# Patient Record
Sex: Female | Born: 1970
Health system: Southern US, Community
[De-identification: ages and names within clinical notes are randomized; demographics above are authoritative.]

## PROBLEM LIST (undated history)

## (undated) DIAGNOSIS — R55 Syncope and collapse: Secondary | ICD-10-CM

## (undated) DIAGNOSIS — G43909 Migraine, unspecified, not intractable, without status migrainosus: Secondary | ICD-10-CM

## (undated) DIAGNOSIS — E669 Obesity, unspecified: Secondary | ICD-10-CM

## (undated) DIAGNOSIS — F3281 Premenstrual dysphoric disorder: Secondary | ICD-10-CM

## (undated) DIAGNOSIS — F32A Depression, unspecified: Secondary | ICD-10-CM

## (undated) DIAGNOSIS — S86009A Unspecified injury of unspecified Achilles tendon, initial encounter: Secondary | ICD-10-CM

## (undated) DIAGNOSIS — F329 Major depressive disorder, single episode, unspecified: Secondary | ICD-10-CM

## (undated) DIAGNOSIS — N83209 Unspecified ovarian cyst, unspecified side: Secondary | ICD-10-CM

## (undated) DIAGNOSIS — F988 Other specified behavioral and emotional disorders with onset usually occurring in childhood and adolescence: Secondary | ICD-10-CM

## (undated) DIAGNOSIS — K589 Irritable bowel syndrome without diarrhea: Secondary | ICD-10-CM

## (undated) HISTORY — DX: Unspecified ovarian cyst, unspecified side: N83.209

## (undated) HISTORY — DX: Other specified behavioral and emotional disorders with onset usually occurring in childhood and adolescence: F98.8

## (undated) HISTORY — DX: Obesity, unspecified: E66.9

## (undated) HISTORY — DX: Depression, unspecified: F32.A

## (undated) HISTORY — DX: Premenstrual dysphoric disorder: F32.81

## (undated) HISTORY — DX: Unspecified injury of unspecified achilles tendon, initial encounter: S86.009A

## (undated) HISTORY — DX: Major depressive disorder, single episode, unspecified: F32.9

## (undated) HISTORY — DX: Irritable bowel syndrome, unspecified: K58.9

---

## 2005-04-28 ENCOUNTER — Encounter: Payer: Self-pay | Admitting: Family Medicine

## 2007-10-31 ENCOUNTER — Ambulatory Visit: Payer: Self-pay | Admitting: Family Medicine

## 2007-10-31 DIAGNOSIS — F988 Other specified behavioral and emotional disorders with onset usually occurring in childhood and adolescence: Secondary | ICD-10-CM | POA: Insufficient documentation

## 2007-10-31 LAB — CONVERTED CEMR LAB
Nitrite: NEGATIVE
Specific Gravity, Urine: <1.005

## 2007-11-01 ENCOUNTER — Encounter: Payer: Self-pay | Admitting: Family Medicine

## 2007-11-08 ENCOUNTER — Encounter: Payer: Self-pay | Admitting: Family Medicine

## 2007-11-27 ENCOUNTER — Encounter: Payer: Self-pay | Admitting: Family Medicine

## 2007-12-14 ENCOUNTER — Other Ambulatory Visit: Admission: RE | Admit: 2007-12-14 | Discharge: 2007-12-14 | Payer: Self-pay | Admitting: Family Medicine

## 2007-12-14 ENCOUNTER — Ambulatory Visit: Payer: Self-pay | Admitting: Family Medicine

## 2007-12-14 ENCOUNTER — Encounter: Payer: Self-pay | Admitting: Family Medicine

## 2007-12-14 LAB — CONVERTED CEMR LAB
Albumin: 4 g/dL (ref 3.5–5.2)
Alkaline Phosphatase: 108 units/L (ref 39–117)
BUN: 12 mg/dL (ref 6–23)
Calcium: 8.9 mg/dL (ref 8.4–10.5)
Creatinine, Ser: 0.78 mg/dL (ref 0.40–1.20)
Glucose, Bld: 112 mg/dL — ABNORMAL HIGH (ref 70–99)
HDL: 45 mg/dL (ref >39)
LDL Cholesterol: 136 mg/dL — ABNORMAL HIGH (ref 0–99)
Potassium: 4.6 meq/L (ref 3.5–5.3)
Triglycerides: 217 mg/dL — ABNORMAL HIGH (ref <150)

## 2007-12-15 ENCOUNTER — Encounter: Payer: Self-pay | Admitting: Family Medicine

## 2007-12-21 LAB — CONVERTED CEMR LAB: Pap Smear: NORMAL

## 2008-02-14 ENCOUNTER — Telehealth: Payer: Self-pay | Admitting: Family Medicine

## 2008-03-11 ENCOUNTER — Encounter: Admission: RE | Admit: 2008-03-11 | Discharge: 2008-03-11 | Payer: Self-pay | Admitting: Family Medicine

## 2008-03-11 ENCOUNTER — Ambulatory Visit: Payer: Self-pay | Admitting: Family Medicine

## 2008-03-12 ENCOUNTER — Encounter: Payer: Self-pay | Admitting: Family Medicine

## 2008-03-12 ENCOUNTER — Encounter: Admission: RE | Admit: 2008-03-12 | Discharge: 2008-03-27 | Payer: Self-pay | Admitting: Family Medicine

## 2008-03-27 ENCOUNTER — Encounter: Payer: Self-pay | Admitting: Family Medicine

## 2008-04-18 ENCOUNTER — Ambulatory Visit: Payer: Self-pay | Admitting: Family Medicine

## 2008-04-22 ENCOUNTER — Telehealth: Payer: Self-pay | Admitting: Family Medicine

## 2008-06-25 ENCOUNTER — Ambulatory Visit: Payer: Self-pay | Admitting: Family Medicine

## 2008-08-28 ENCOUNTER — Ambulatory Visit: Payer: Self-pay | Admitting: Family Medicine

## 2008-08-28 DIAGNOSIS — F411 Generalized anxiety disorder: Secondary | ICD-10-CM | POA: Insufficient documentation

## 2008-08-28 DIAGNOSIS — M26629 Arthralgia of temporomandibular joint, unspecified side: Secondary | ICD-10-CM | POA: Insufficient documentation

## 2008-09-30 ENCOUNTER — Ambulatory Visit: Payer: Self-pay | Admitting: Family Medicine

## 2008-09-30 DIAGNOSIS — I10 Essential (primary) hypertension: Secondary | ICD-10-CM | POA: Insufficient documentation

## 2008-09-30 DIAGNOSIS — R7301 Impaired fasting glucose: Secondary | ICD-10-CM | POA: Insufficient documentation

## 2008-12-04 ENCOUNTER — Emergency Department (HOSPITAL_BASED_OUTPATIENT_CLINIC_OR_DEPARTMENT_OTHER): Admission: EM | Admit: 2008-12-04 | Discharge: 2008-12-04 | Payer: Self-pay | Admitting: Emergency Medicine

## 2008-12-04 ENCOUNTER — Ambulatory Visit: Payer: Self-pay | Admitting: Diagnostic Radiology

## 2008-12-16 ENCOUNTER — Encounter: Payer: Self-pay | Admitting: Family Medicine

## 2008-12-17 ENCOUNTER — Telehealth (INDEPENDENT_AMBULATORY_CARE_PROVIDER_SITE_OTHER): Payer: Self-pay | Admitting: *Deleted

## 2008-12-17 ENCOUNTER — Ambulatory Visit: Payer: Self-pay | Admitting: Family Medicine

## 2008-12-17 DIAGNOSIS — N83209 Unspecified ovarian cyst, unspecified side: Secondary | ICD-10-CM | POA: Insufficient documentation

## 2008-12-18 ENCOUNTER — Ambulatory Visit: Payer: Self-pay | Admitting: Family Medicine

## 2008-12-18 DIAGNOSIS — R799 Abnormal finding of blood chemistry, unspecified: Secondary | ICD-10-CM | POA: Insufficient documentation

## 2008-12-26 ENCOUNTER — Encounter: Admission: RE | Admit: 2008-12-26 | Discharge: 2008-12-26 | Payer: Self-pay | Admitting: Family Medicine

## 2008-12-26 ENCOUNTER — Encounter: Payer: Self-pay | Admitting: Family Medicine

## 2008-12-26 DIAGNOSIS — K449 Diaphragmatic hernia without obstruction or gangrene: Secondary | ICD-10-CM | POA: Insufficient documentation

## 2009-01-02 ENCOUNTER — Ambulatory Visit: Payer: Self-pay | Admitting: Family Medicine

## 2009-01-02 DIAGNOSIS — S96819A Strain of other specified muscles and tendons at ankle and foot level, unspecified foot, initial encounter: Secondary | ICD-10-CM

## 2009-01-02 DIAGNOSIS — S93499A Sprain of other ligament of unspecified ankle, initial encounter: Secondary | ICD-10-CM | POA: Insufficient documentation

## 2009-01-14 ENCOUNTER — Encounter: Admission: RE | Admit: 2009-01-14 | Discharge: 2009-03-25 | Payer: Self-pay | Admitting: Family Medicine

## 2009-01-17 ENCOUNTER — Encounter: Payer: Self-pay | Admitting: Family Medicine

## 2009-03-17 ENCOUNTER — Ambulatory Visit: Payer: Self-pay | Admitting: Family Medicine

## 2009-03-17 DIAGNOSIS — N920 Excessive and frequent menstruation with regular cycle: Secondary | ICD-10-CM | POA: Insufficient documentation

## 2009-03-18 ENCOUNTER — Encounter: Payer: Self-pay | Admitting: Family Medicine

## 2009-03-18 LAB — CONVERTED CEMR LAB
Albumin: 3.9 g/dL (ref 3.5–5.2)
CO2: 24 meq/L (ref 19–32)
Cholesterol: 218 mg/dL — ABNORMAL HIGH (ref 0–200)
Glucose, Bld: 104 mg/dL — ABNORMAL HIGH (ref 70–99)
Potassium: 4.3 meq/L (ref 3.5–5.3)
RBC: 4.75 M/uL (ref 3.87–5.11)
Sodium: 139 meq/L (ref 135–145)
TIBC: 314 ug/dL (ref 250–470)
Total Protein: 6.6 g/dL (ref 6.0–8.3)
Triglycerides: 178 mg/dL — ABNORMAL HIGH (ref <150)
UIBC: 220 ug/dL
WBC: 6.2 10*3/uL (ref 4.0–10.5)

## 2009-03-19 DIAGNOSIS — R748 Abnormal levels of other serum enzymes: Secondary | ICD-10-CM | POA: Insufficient documentation

## 2009-03-20 ENCOUNTER — Telehealth (INDEPENDENT_AMBULATORY_CARE_PROVIDER_SITE_OTHER): Payer: Self-pay | Admitting: *Deleted

## 2009-03-20 ENCOUNTER — Encounter: Admission: RE | Admit: 2009-03-20 | Discharge: 2009-03-20 | Payer: Self-pay | Admitting: Sports Medicine

## 2009-03-20 ENCOUNTER — Encounter: Payer: Self-pay | Admitting: Family Medicine

## 2009-03-24 ENCOUNTER — Encounter: Payer: Self-pay | Admitting: Family Medicine

## 2009-03-25 ENCOUNTER — Telehealth (INDEPENDENT_AMBULATORY_CARE_PROVIDER_SITE_OTHER): Payer: Self-pay | Admitting: *Deleted

## 2009-03-28 ENCOUNTER — Encounter: Payer: Self-pay | Admitting: Family Medicine

## 2009-03-31 ENCOUNTER — Telehealth (INDEPENDENT_AMBULATORY_CARE_PROVIDER_SITE_OTHER): Payer: Self-pay | Admitting: *Deleted

## 2009-04-01 ENCOUNTER — Telehealth (INDEPENDENT_AMBULATORY_CARE_PROVIDER_SITE_OTHER): Payer: Self-pay | Admitting: *Deleted

## 2009-04-06 ENCOUNTER — Ambulatory Visit: Payer: Self-pay | Admitting: Internal Medicine

## 2009-04-07 ENCOUNTER — Ambulatory Visit: Payer: Self-pay | Admitting: Endocrinology

## 2009-04-07 LAB — CONVERTED CEMR LAB
Bilirubin Urine: NEGATIVE
Calcium, Total (PTH): 9 mg/dL (ref 8.4–10.5)
Ketones, urine, test strip: NEGATIVE
WBC Urine, dipstick: NEGATIVE

## 2009-04-08 ENCOUNTER — Ambulatory Visit: Payer: Self-pay | Admitting: Family Medicine

## 2009-04-08 DIAGNOSIS — J209 Acute bronchitis, unspecified: Secondary | ICD-10-CM | POA: Insufficient documentation

## 2009-04-08 LAB — CONVERTED CEMR LAB: Alkaline Phosphatase: 92 units/L (ref 39–117)

## 2009-05-15 ENCOUNTER — Encounter: Payer: Self-pay | Admitting: Family Medicine

## 2009-05-27 ENCOUNTER — Ambulatory Visit: Payer: Self-pay | Admitting: Family Medicine

## 2009-05-28 ENCOUNTER — Ambulatory Visit: Payer: Self-pay | Admitting: Sports Medicine

## 2009-05-28 DIAGNOSIS — M216X9 Other acquired deformities of unspecified foot: Secondary | ICD-10-CM | POA: Insufficient documentation

## 2009-06-04 ENCOUNTER — Telehealth: Payer: Self-pay | Admitting: Family Medicine

## 2009-06-04 DIAGNOSIS — J329 Chronic sinusitis, unspecified: Secondary | ICD-10-CM | POA: Insufficient documentation

## 2009-06-05 ENCOUNTER — Encounter: Admission: RE | Admit: 2009-06-05 | Discharge: 2009-06-05 | Payer: Self-pay | Admitting: Family Medicine

## 2009-06-18 ENCOUNTER — Encounter: Payer: Self-pay | Admitting: Family Medicine

## 2009-06-19 ENCOUNTER — Ambulatory Visit: Payer: Self-pay | Admitting: Diagnostic Radiology

## 2009-06-19 ENCOUNTER — Emergency Department (HOSPITAL_BASED_OUTPATIENT_CLINIC_OR_DEPARTMENT_OTHER): Admission: EM | Admit: 2009-06-19 | Discharge: 2009-06-19 | Payer: Self-pay | Admitting: Emergency Medicine

## 2009-06-24 ENCOUNTER — Ambulatory Visit: Payer: Self-pay | Admitting: Family Medicine

## 2009-06-24 DIAGNOSIS — M5137 Other intervertebral disc degeneration, lumbosacral region: Secondary | ICD-10-CM | POA: Insufficient documentation

## 2009-06-24 DIAGNOSIS — K921 Melena: Secondary | ICD-10-CM | POA: Insufficient documentation

## 2009-06-28 HISTORY — PX: LAPAROSCOPIC CHOLECYSTECTOMY: SUR755

## 2009-07-02 ENCOUNTER — Ambulatory Visit: Payer: Self-pay | Admitting: Obstetrics & Gynecology

## 2009-07-07 ENCOUNTER — Encounter: Payer: Self-pay | Admitting: Family Medicine

## 2009-07-07 ENCOUNTER — Telehealth: Payer: Self-pay | Admitting: Family Medicine

## 2009-07-14 ENCOUNTER — Encounter: Admission: RE | Admit: 2009-07-14 | Discharge: 2009-10-12 | Payer: Self-pay | Admitting: Orthopedic Surgery

## 2009-07-14 ENCOUNTER — Ambulatory Visit: Payer: Self-pay | Admitting: Sports Medicine

## 2009-08-14 ENCOUNTER — Ambulatory Visit: Payer: Self-pay | Admitting: Sports Medicine

## 2009-08-14 DIAGNOSIS — M79609 Pain in unspecified limb: Secondary | ICD-10-CM | POA: Insufficient documentation

## 2009-09-01 ENCOUNTER — Ambulatory Visit: Payer: Self-pay | Admitting: Sports Medicine

## 2009-09-16 ENCOUNTER — Encounter: Admission: RE | Admit: 2009-09-16 | Discharge: 2009-09-16 | Payer: Self-pay | Admitting: Family Medicine

## 2009-09-16 ENCOUNTER — Ambulatory Visit: Payer: Self-pay | Admitting: Family Medicine

## 2009-09-16 DIAGNOSIS — R519 Headache, unspecified: Secondary | ICD-10-CM | POA: Insufficient documentation

## 2009-09-16 DIAGNOSIS — R51 Headache: Secondary | ICD-10-CM | POA: Insufficient documentation

## 2009-09-17 LAB — CONVERTED CEMR LAB
ALT: 18 units/L (ref 0–35)
AST: 14 units/L (ref 0–37)
Alkaline Phosphatase: 112 units/L (ref 39–117)
Basophils Absolute: 0.1 10*3/uL (ref 0.0–0.1)
Chloride: 104 meq/L (ref 96–112)
Creatinine, Ser: 0.74 mg/dL (ref 0.40–1.20)
Eosinophils Absolute: 0.7 10*3/uL (ref 0.0–0.7)
Lymphs Abs: 2.5 10*3/uL (ref 0.7–4.0)
MCV: 85.1 fL (ref 78.0–100.0)
Neutrophils Relative %: 67 % (ref 43–77)
Platelets: 399 10*3/uL (ref 150–400)
RDW: 14.5 % (ref 11.5–15.5)
Total Bilirubin: 0.3 mg/dL (ref 0.3–1.2)
WBC: 12.1 10*3/uL — ABNORMAL HIGH (ref 4.0–10.5)

## 2009-09-18 ENCOUNTER — Telehealth: Payer: Self-pay | Admitting: Family Medicine

## 2010-01-12 ENCOUNTER — Ambulatory Visit: Payer: Self-pay | Admitting: Family

## 2010-01-12 DIAGNOSIS — N390 Urinary tract infection, site not specified: Secondary | ICD-10-CM | POA: Insufficient documentation

## 2010-01-12 DIAGNOSIS — R5383 Other fatigue: Secondary | ICD-10-CM

## 2010-01-12 DIAGNOSIS — M549 Dorsalgia, unspecified: Secondary | ICD-10-CM | POA: Insufficient documentation

## 2010-01-12 DIAGNOSIS — R5381 Other malaise: Secondary | ICD-10-CM | POA: Insufficient documentation

## 2010-01-12 LAB — CONVERTED CEMR LAB
ALT: 11 units/L (ref 0–35)
AST: 11 units/L (ref 0–37)
Basophils Relative: 1 % (ref 0–1)
Eosinophils Absolute: 0.4 10*3/uL (ref 0.0–0.7)
Hemoglobin: 12.6 g/dL (ref 12.0–15.0)
Indirect Bilirubin: 0.4 mg/dL (ref 0.0–0.9)
Ketones, urine, test strip: NEGATIVE
Lymphs Abs: 2.3 10*3/uL (ref 0.7–4.0)
MCHC: 31.3 g/dL (ref 30.0–36.0)
MCV: 83.2 fL (ref 78.0–100.0)
Monocytes Absolute: 0.4 10*3/uL (ref 0.1–1.0)
Monocytes Relative: 5 % (ref 3–12)
Nitrite: NEGATIVE
Protein, U semiquant: NEGATIVE
RBC: 4.83 M/uL (ref 3.87–5.11)
TSH: 0.764 microintl units/mL (ref 0.350–4.500)
Total Protein: 6.9 g/dL (ref 6.0–8.3)
Urobilinogen, UA: 0.2
WBC: 8.7 10*3/uL (ref 4.0–10.5)

## 2010-01-13 ENCOUNTER — Encounter: Payer: Self-pay | Admitting: Family

## 2010-01-20 ENCOUNTER — Telehealth: Payer: Self-pay | Admitting: Family Medicine

## 2010-01-20 ENCOUNTER — Ambulatory Visit: Payer: Self-pay | Admitting: Family Medicine

## 2010-01-20 LAB — CONVERTED CEMR LAB
Blood in Urine, dipstick: NEGATIVE
Glucose, Urine, Semiquant: NEGATIVE
Nitrite: NEGATIVE
Protein, U semiquant: NEGATIVE
WBC Urine, dipstick: NEGATIVE
pH: 5.5

## 2010-01-21 ENCOUNTER — Encounter: Payer: Self-pay | Admitting: Family Medicine

## 2010-01-21 DIAGNOSIS — R300 Dysuria: Secondary | ICD-10-CM | POA: Insufficient documentation

## 2010-01-21 LAB — CONVERTED CEMR LAB
Bacteria, UA: NONE SEEN
Casts: NONE SEEN /lpf

## 2010-01-22 ENCOUNTER — Telehealth: Payer: Self-pay | Admitting: Family Medicine

## 2010-01-22 ENCOUNTER — Emergency Department (HOSPITAL_BASED_OUTPATIENT_CLINIC_OR_DEPARTMENT_OTHER): Admission: EM | Admit: 2010-01-22 | Discharge: 2010-01-22 | Payer: Self-pay | Admitting: Emergency Medicine

## 2010-01-28 ENCOUNTER — Ambulatory Visit: Payer: Self-pay | Admitting: Family Medicine

## 2010-01-28 ENCOUNTER — Telehealth: Payer: Self-pay | Admitting: Family Medicine

## 2010-01-28 ENCOUNTER — Ambulatory Visit (HOSPITAL_COMMUNITY): Admission: RE | Admit: 2010-01-28 | Discharge: 2010-01-28 | Payer: Self-pay | Admitting: Family Medicine

## 2010-01-28 ENCOUNTER — Encounter: Admission: RE | Admit: 2010-01-28 | Discharge: 2010-01-28 | Payer: Self-pay | Admitting: Family Medicine

## 2010-01-28 DIAGNOSIS — R61 Generalized hyperhidrosis: Secondary | ICD-10-CM | POA: Insufficient documentation

## 2010-01-29 ENCOUNTER — Telehealth: Payer: Self-pay | Admitting: Family Medicine

## 2010-01-29 ENCOUNTER — Encounter: Payer: Self-pay | Admitting: Family Medicine

## 2010-01-29 DIAGNOSIS — I880 Nonspecific mesenteric lymphadenitis: Secondary | ICD-10-CM | POA: Insufficient documentation

## 2010-01-30 ENCOUNTER — Encounter: Payer: Self-pay | Admitting: Family Medicine

## 2010-02-02 ENCOUNTER — Telehealth (INDEPENDENT_AMBULATORY_CARE_PROVIDER_SITE_OTHER): Payer: Self-pay | Admitting: *Deleted

## 2010-02-07 ENCOUNTER — Encounter: Payer: Self-pay | Admitting: Family Medicine

## 2010-02-11 ENCOUNTER — Encounter: Payer: Self-pay | Admitting: Family Medicine

## 2010-02-12 ENCOUNTER — Encounter: Payer: Self-pay | Admitting: Family Medicine

## 2010-02-17 ENCOUNTER — Encounter: Payer: Self-pay | Admitting: Family Medicine

## 2010-03-04 ENCOUNTER — Encounter: Payer: Self-pay | Admitting: Family Medicine

## 2010-06-15 ENCOUNTER — Ambulatory Visit: Payer: Self-pay | Admitting: Family Medicine

## 2010-06-15 DIAGNOSIS — N644 Mastodynia: Secondary | ICD-10-CM | POA: Insufficient documentation

## 2010-07-01 ENCOUNTER — Ambulatory Visit
Admission: RE | Admit: 2010-07-01 | Discharge: 2010-07-01 | Payer: Self-pay | Source: Home / Self Care | Attending: Family Medicine | Admitting: Family Medicine

## 2010-07-01 DIAGNOSIS — R011 Cardiac murmur, unspecified: Secondary | ICD-10-CM | POA: Insufficient documentation

## 2010-07-01 DIAGNOSIS — N926 Irregular menstruation, unspecified: Secondary | ICD-10-CM | POA: Insufficient documentation

## 2010-07-02 ENCOUNTER — Encounter: Payer: Self-pay | Admitting: Family Medicine

## 2010-07-02 LAB — CONVERTED CEMR LAB
HCT: 39.1 % (ref 36.0–46.0)
Hemoglobin: 12.2 g/dL (ref 12.0–15.0)
Iron: 75 ug/dL (ref 42–145)
MCHC: 31.2 g/dL (ref 30.0–36.0)
MCV: 84.8 fL (ref 78.0–100.0)
Platelets: 366 10*3/uL (ref 150–400)
RDW: 14.5 % (ref 11.5–15.5)
Saturation Ratios: 21 % (ref 20–55)

## 2010-07-07 ENCOUNTER — Encounter
Admission: RE | Admit: 2010-07-07 | Discharge: 2010-07-07 | Payer: Self-pay | Source: Home / Self Care | Attending: Family Medicine | Admitting: Family Medicine

## 2010-07-19 ENCOUNTER — Encounter: Payer: Self-pay | Admitting: Obstetrics & Gynecology

## 2010-07-20 ENCOUNTER — Encounter: Payer: Self-pay | Admitting: Gastroenterology

## 2010-07-28 NOTE — Assessment & Plan Note (Signed)
Summary: FU PARTIAL ACHILLES TEAR/MJD   Vital Signs:  Patient profile:   40 year old female BP sitting:   127 / 88  Vitals Entered By: Lillia Pauls CMA (July 14, 2009 11:13 AM)  Primary Provider:  Seymour Bars DO   History of Present Illness: This is 6 week follow up of Rt heel pain in patient referred by dr draper for consideration of NTG TX she did have a partial achilles tear This is on lateral margin of AT at insertion to calcaneus which is a diffucult area to heal has done exercises used patch still has pain - not too much different however less tender less trouble walking with insoles with heel lifts  Allergies: No Known Drug Allergies  Physical Exam  General:  Well-developed,well-nourished,in no acute distress; alert,appropriate and cooperative throughout examination Msk:  RT AT does not appear or feel swollen no nodules not tender to palpation or ligtht squeeze - which is big difference  MSK [place] much improved appearance of area of tear in terms of decreased neovessels there is still marginal tear still edema in deep 30 % of distal tendon these findings are  slighlty less than before  images saved   Impression & Recommendations:  Problem # 1:  ACHILLES TENDON TEAR (ICD-845.09)  some improvement clinically on exam definite improvement on [place] appearance  would keep with plan eccentric exercises NTG patches - will increase dose to see if this speeds resolution  this may take 3 to 6 mos but appears to be starting healing process  reck 6 wks  Orders: US EXTREMITY NON-VASC REAL-TIME IMG (76160)  Complete Medication List: 1)  Lasix 20 Mg Tabs (Furosemide) .Marland Kitchen.. 1 tab by mouth daily as needed for leg swelling 2)  Prozac 20 Mg Caps (Fluoxetine hcl) .Marland Kitchen.. 1 capsule by mouth daily 3)  Alprazolam 0.5 Mg Tabs (Alprazolam) .... 1/2 to 1 tab by mouth daily as needed for anxiety 4)  Enalapril Maleate 5 Mg Tabs (Enalapril maleate) .Marland Kitchen.. 1 tab by mouth daily 5)   Omeprazole 40 Mg Cpdr (Omeprazole) .Marland Kitchen.. 1 tab by mouth daily 6)  Jolessa 0.15-0.03 Mg Tabs (Levonorgest-eth estrad 91-day) .Marland Kitchen.. 1 tab by mouth daily 7)  Nitroglycerin 0.4 Mg/hr Pt24 (Nitroglycerin) .... Use 1/4 patch daily to rt achilles tendon Prescriptions: NITROGLYCERIN 0.4 MG/HR PT24 (NITROGLYCERIN) use 1/4 patch daily to RT Achilles Tendon  #30 x 2   Entered and Authorized by:   Enid Baas MD   Signed by:   Enid Baas MD on 07/14/2009   Method used:   Electronically to        [facility]* (retail)       [address] [place], [place]  [address]       Ph: [phone]       Fax: [phone]   RxID:   [id number]

## 2010-07-28 NOTE — Letter (Signed)
Summary: [facility]  [facility]   Imported By: Sherian Rein 03/14/2010 11:08:29  _____________________________________________________________________  External Attachment:    Type:   Image     Comment:   External Document

## 2010-07-28 NOTE — Letter (Signed)
Summary: Letter with Path Results/[facility]  Letter with Path Results/[facility]   Imported By: Lanelle Bal 02/17/2010 13:00:10  _____________________________________________________________________  External Attachment:    Type:   Image     Comment:   External Document

## 2010-07-28 NOTE — Progress Notes (Signed)
Summary: UTI  Phone Note Call from Patient   Caller: Patient Summary of Call: Pt LMOM stating she has completed ABX but is now feeling worse than before. Pt would like to know what labs showed and what she should do from here. Please advise. Initial call taken by: Payton Spark CMA,  January 20, 2010 2:33 PM  Follow-up for Phone Call        Her urine grew out E. Coli sensitive to the Cipro, so this should have worked.  She is welcome to bring by another urine sample today to retest.   Follow-up by: Seymour Bars DO,  January 20, 2010 2:57 PM     Appended Document: UTI Pt will come by for nurse visit [facility]

## 2010-07-28 NOTE — Assessment & Plan Note (Signed)
Summary: F/U,[facility]   Vital Signs:  Patient profile:   40 year old female BP sitting:   122 / 86  Vitals Entered By: Lillia Pauls CMA (September 01, 2009 11:02 AM)  Primary Provider:  Seymour Bars DO   History of Present Illness: After 1 week calf pain was less now has no pain in calf  using NTG patches on AT good days and bad days with AT some days fairly painful and gets to a 7 or 8 worse when on feet a lot shoe wear makes a difference   certain motions like driving worsens this   Allergies: No Known Drug Allergies  Physical Exam  General:  Well-developed,well-nourished,in no acute distress; alert,appropriate and cooperative throughout examination Msk:  AT still feels thickend at insertion still mildly tender but not as before calf nontender  repeat scan with [place] Still shows a partial tear at deep insertion This is seen on both long and trans view doppler activity shows lots of neovessels no change since initial scan  images saved   Impression & Recommendations:  Problem # 1:  ACHILLES TENDON TEAR (ICD-845.09)  This has not healed to date with exderise and NTG will have her cont exercise cont NTG pain is less so maybe some clinical response no change in appearance on scanning would persist at least 6 mos  reck 6 wks  Orders: [place] LIMITED (29562)  Problem # 2:  LEG PAIN (ICD-729.5) calf is much better no pain today  Complete Medication List: 1)  Lasix 20 Mg Tabs (Furosemide) .Marland Kitchen.. 1 tab by mouth daily as needed for leg swelling 2)  Prozac 20 Mg Caps (Fluoxetine hcl) .Marland Kitchen.. 1 capsule by mouth daily 3)  Alprazolam 0.5 Mg Tabs (Alprazolam) .... 1/2 to 1 tab by mouth daily as needed for anxiety 4)  Enalapril Maleate 5 Mg Tabs (Enalapril maleate) .Marland Kitchen.. 1 tab by mouth daily 5)  Omeprazole 40 Mg Cpdr (Omeprazole) .Marland Kitchen.. 1 tab by mouth daily 6)  Jolessa 0.15-0.03 Mg Tabs (Levonorgest-eth estrad 91-day) .Marland Kitchen.. 1 tab by mouth daily 7)  Nitroglycerin 0.4 Mg/hr Pt24  (Nitroglycerin) .... Use 1/4 patch daily to rt achilles tendon

## 2010-07-28 NOTE — Progress Notes (Signed)
Summary: Xanax refill  Phone Note Refill Request Message from:  Patient on July 07, 2009 12:42 PM  Refills Requested: Medication #1:  ALPRAZOLAM 0.5 MG TABS 1/2 to 1 tab by mouth daily as needed for anxiety Initial call taken by: Payton Spark CMA,  July 07, 2009 12:42 PM    Prescriptions: ALPRAZOLAM 0.5 MG TABS (ALPRAZOLAM) 1/2 to 1 tab by mouth daily as needed for anxiety  #30 x 0   Entered and Authorized by:   Seymour Bars DO   Signed by:   Seymour Bars DO on 07/07/2009   Method used:   Printed then faxed to ...       [facility]* (retail)       [address] [place], [place]  [address]       Ph: [phone]       Fax: [phone]   RxID:   [id number]

## 2010-07-28 NOTE — Consult Note (Signed)
Summary: [facility]  [facility]   Imported By: Lanelle Bal 07/29/2009 12:49:58  _____________________________________________________________________  External Attachment:    Type:   Image     Comment:   External Document

## 2010-07-28 NOTE — Letter (Signed)
Summary: Letter to Patient with [place] Results/[facility]  Letter to Patient with [place] Results/[facility]   Imported By: Lanelle Bal 02/10/2010 12:17:19  _____________________________________________________________________  External Attachment:    Type:   Image     Comment:   External Document

## 2010-07-28 NOTE — Assessment & Plan Note (Signed)
Summary: RT CALF PAIN/RADIATING/PER NM/[facility]   Vital Signs:  Patient profile:   40 year old female BP sitting:   131 / 85  Vitals Entered By: Lillia Pauls CMA (August 14, 2009 3:38 PM)  Primary Provider:  Seymour Bars DO   History of Present Illness: Pt presents with right calf pain that started insideously on Sunday 08/10/2009. She has a history of achilles tendinitis and has been doing calf exercises regularly. She has also been using nitroglycerin patches. Her calf pain was fairly severe on Sunday and Monday but started to get better on Tuesday. When the pain was at its worse, she felt like her calf muscles were "splitting" and her calf felt warm at the beginning. She did not notice any swelling. The pain is worse if she stand up on her toes or when she does heel raises. Not as much pain when she is at rest.  She has flown between [place] and [place] this past week with arrival being on Saturday, 08/09/2009. She is on an OCP. She does not smoke. She denies any personal or family history of blood clots. She cannot remember any bad steps or injuries to her right calf. She has been using ice as well as ibuprofen which have not been very helpful.   Allergies: No Known Drug Allergies  Physical Exam  General:  alert and well-developed.   Head:  normocephalic and atraumatic.   Neck:  supple.   Lungs:  normal respiratory effort.   Msk:  Right Lower Extremity No bony abnormalities, edema or bruising + TTP over medial gastroc muscle  Mild + TTP over her achilles tendon but improved since last visit Full ROM of her ankle in all direction Pain with resisted plantarflexion otherwise 5/5 strength throughout Walks with a slight limp  Left Lower Extremity No bony abnormalities, edema or bruising Full ROM of her ankle without pain 5/5 strength throughout No TTP throughout Can bear weight easily  Neg Cuff test Right calf diameter was 46cm at 14cm below the patella. Left calf diameter was  also 46cm.  Additional Exam:  U/S of the right gastroc shows edema and small tears on both the transverse and long views. She has good blood flow. No increased Doppler flow at this time. No DVTs appreciated at this time. Images saved for documentation.    Impression & Recommendations:  Problem # 1:  LEG PAIN (ICD-729.5) Assessment New  Appears to have a gastro tear of her right calf 1. Given an ACE bandage to wear with felt for compression for the next week 2. Can take ibuprofen as needed for pain 3. Ice for 20 mintues two times a day 4. Hold on calf exercises for the next week and then gradually return to two footed exercises 5. Return in 2 weeks 6. Can consider taking an 81mg  ASA before flights to prevent DVT  Orders: US EXTREMITY NON-VASC REAL-TIME IMG (83151)  Complete Medication List: 1)  Lasix 20 Mg Tabs (Furosemide) .Marland Kitchen.. 1 tab by mouth daily as needed for leg swelling 2)  Prozac 20 Mg Caps (Fluoxetine hcl) .Marland Kitchen.. 1 capsule by mouth daily 3)  Alprazolam 0.5 Mg Tabs (Alprazolam) .... 1/2 to 1 tab by mouth daily as needed for anxiety 4)  Enalapril Maleate 5 Mg Tabs (Enalapril maleate) .Marland Kitchen.. 1 tab by mouth daily 5)  Omeprazole 40 Mg Cpdr (Omeprazole) .Marland Kitchen.. 1 tab by mouth daily 6)  Jolessa 0.15-0.03 Mg Tabs (Levonorgest-eth estrad 91-day) .Marland Kitchen.. 1 tab by mouth daily 7)  Nitroglycerin 0.4  Mg/hr Pt24 (Nitroglycerin) .... Use 1/4 patch daily to rt achilles tendon  Patient Instructions: 1)  Wear the ACE wrap with the felt behind your calf for the next week. Do not wear this on the plane though. 2)  Do not do any exercises for the next week. 3)  You can restart gentle exercises in a week using both feet for toe and heel raises. 4)  You can continue to take the ibuprofen as needed for pain. 5)  Ice for 20 minutes twice a day.  6)  Return in 2 weeks for follow-up.

## 2010-07-28 NOTE — Consult Note (Signed)
Summary: [facility]   Imported By: Lanelle Bal 03/09/2010 09:30:50  _____________________________________________________________________  External Attachment:    Type:   Image     Comment:   External Document

## 2010-07-28 NOTE — Assessment & Plan Note (Signed)
Summary: UA  Nurse Visit   Vitals Entered By: Payton Spark CMA (January 20, 2010 4:14 PM)  Allergies: No Known Drug Allergies Laboratory Results   Urine Tests    Routine Urinalysis   Color: yellow Appearance: Clear Glucose: negative   (Normal Range: Negative) Bilirubin: negative   (Normal Range: Negative) Ketone: negative   (Normal Range: Negative) Spec. Gravity: 1.020   (Normal Range: 1.003-1.035) Blood: negative   (Normal Range: Negative) pH: 5.5   (Normal Range: 5.0-8.0) Protein: negative   (Normal Range: Negative) Urobilinogen: 0.2   (Normal Range: 0-1) Nitrite: negative   (Normal Range: Negative) Leukocyte Esterace: negative   (Normal Range: Negative)       Orders Added: 1)  UA Dipstick w/o Micro (automated)  [81003] 2)  T-Urine Microscopic [66440-34742] 3)  T-Culture, Urine [[id number]] 4)  Est. Patient Level I [33295]    Impression & Recommendations:  Problem # 1:  UTI (ICD-599.0) UA unremarkable today.  Will send for micro and cx.  If micro is + for WBC, consider interstitial cystitis workup.   For today, Use OTC AZO + drink plenty of water.  Will call her back tomorrow with the micro.  Her updated medication list for this problem includes:    Cipro 500 Mg Tabs (Ciprofloxacin hcl) ..... One tablet by mouth two times a day x 3 days  Orders: UA Dipstick w/o Micro (automated)  (81003) T-Urine Microscopic (18841-66063) T-Culture, Urine ([id number])  Complete Medication List: 1)  Prozac 20 Mg Caps (Fluoxetine hcl) .Marland Kitchen.. 1 capsule by mouth daily 2)  Alprazolam 0.5 Mg Tabs (Alprazolam) .... 1/2 to 1 tab by mouth daily as needed for anxiety 3)  Enalapril Maleate 5 Mg Tabs (Enalapril maleate) .Marland Kitchen.. 1 tab by mouth daily 4)  Omeprazole 40 Mg Cpdr (Omeprazole) .Marland Kitchen.. 1 tab by mouth daily 5)  Jolessa 0.15-0.03 Mg Tabs (Levonorgest-eth estrad 91-day) .Marland Kitchen.. 1 tab by mouth daily 6)  Nitroglycerin 0.4 Mg/hr Pt24 (Nitroglycerin) .... Use 1/4 patch daily to rt achilles  tendon 7)  Flexeril 10 Mg Tabs (Cyclobenzaprine hcl) .Marland Kitchen.. 1 tab by mouth at bedtime as needed headache/ back pain 8)  Methylphenidate Hcl 20 Mg Tabs (Methylphenidate hcl) .Marland Kitchen.. 1 tab by mouth two times a day as needed 9)  Fish Oil 1000 Mg Caps (Omega-3 fatty acids) .... Take 2 capsules by mouth once a day 10)  Cipro 500 Mg Tabs (Ciprofloxacin hcl) .... One tablet by mouth two times a day x 3 days  Appended Document: UA Pt aware of the above. Pt would like to know if you think she could have kidney stones since she is having so much back pain w/ urinary discomfort. Please advise.  Appended Document: UA Her urine is negative for blood, so this is unlikely.  Seymour Bars, D.O.  Appended Document: UA Pt aware

## 2010-07-28 NOTE — Progress Notes (Signed)
Summary: Feeling worse  Phone Note Call from Patient   Caller: Patient Summary of Call: Pt states she went to see GI and they suggested colonoscopy and does not feel that she needs colonoscopy. Pt is now feeling even worse. Pt would like to know what you think she should do. Please advise. Initial call taken by: Payton Spark CMA,  February 02, 2010 12:24 PM  Follow-up for Phone Call        If she is feeling worse, I suggest she go to the ED.  She may need exploratory laparascopy as opposed to a colonoscopy to evaluate what is going on.   Follow-up by: Seymour Bars DO,  February 02, 2010 12:29 PM  Additional Follow-up for Phone Call Additional follow up Details #1::        Pt states she will go to [facility] ED. I will fax notes to charge nurse. Additional Follow-up by: Payton Spark CMA,  February 02, 2010 12:32 PM

## 2010-07-28 NOTE — Consult Note (Signed)
Summary: [facility]  [facility]   Imported By: Lanelle Bal 02/06/2010 10:51:22  _____________________________________________________________________  External Attachment:    Type:   Image     Comment:   External Document

## 2010-07-28 NOTE — Letter (Signed)
Summary: Out of Work  [facility]  [address]   [place], [place] [address]   Phone: [phone]  Fax: [phone]    January 29, 2010   Employee:  FREDONIA CASALINO    To Whom It May Concern:   For Medical reasons, please excuse the above named employee from work for the following dates:  Start:   Aug 3rd -5th  End:   Aug 6th  If you need additional information, please feel free to contact our office.         Sincerely,    Seymour Bars DO  Appended Document: Out of Work FAX to [phone].  Seymour Bars, D.O.  Appended Document: Out of Work letter faxed

## 2010-07-28 NOTE — Progress Notes (Signed)
Summary: Feeling worse and fever  Phone Note Call from Patient   Caller: Patient Summary of Call: Pt LMOM stating she is now feeling even worse now w/ fever, sweats and chills. Pt scheduled w/ urology 02/18/10 and would like to what you think she should do. Please advise. Initial call taken by: Payton Spark CMA,  January 22, 2010 11:30 AM  Follow-up for Phone Call        she needs to go to [facility] today. Follow-up by: Seymour Bars DO,  January 22, 2010 11:38 AM     Appended Document: Feeling worse and fever Pt aware of the above

## 2010-07-28 NOTE — Letter (Signed)
Summary: Letter Regarding Lab Results/[facility]  Letter Regarding Lab Results/[facility]   Imported By: Lanelle Bal 03/12/2010 11:56:28  _____________________________________________________________________  External Attachment:    Type:   Image     Comment:   External Document

## 2010-07-28 NOTE — Consult Note (Signed)
Summary: [facility]  [facility]   Imported By: Lanelle Bal 02/23/2010 10:20:25  _____________________________________________________________________  External Attachment:    Type:   Image     Comment:   External Document

## 2010-07-28 NOTE — Progress Notes (Signed)
Summary: Methylphenidate refills  Phone Note Call from Patient   Summary of Call: Pt would like refills of Methylphenidate 20. It was removed from her med list last year by [facility] so I am not sure if she is to be on med. Please advise. Initial call taken by: Payton Spark CMA,  September 18, 2009 10:22 AM  Follow-up for Phone Call        removed from med list 03-17-2009 Follow-up by: Seymour Bars DO,  September 18, 2009 10:27 AM     Appended Document: Methylphenidate refills Prescriptions: METHYLPHENIDATE HCL 20 MG TABS (METHYLPHENIDATE HCL) 1 tab by mouth two times a day  #60 x 0   Entered and Authorized by:   Seymour Bars DO   Signed by:   Seymour Bars DO on 09/18/2009   Method used:   Print then Give to Patient   RxID:   [id number]

## 2010-07-28 NOTE — Assessment & Plan Note (Signed)
Summary: ? perinephric abscess   Vital Signs:  Patient profile:   40 year old female Height:      67 inches Weight:      240 pounds BMI:     37.73 O2 Sat:      97 % on Room air Temp:     98.9 degrees F oral Pulse rate:   81 / minute BP sitting:   127 / 84  (left arm) Cuff size:   large  Vitals Entered By: Payton Spark CMA (January 28, 2010 1:03 PM)  O2 Flow:  Room air CC: Chills, abd pain and back pain.   Primary Care Janayah Zavada:  Seymour Bars DO  CC:  Chills and abd pain and back pain.Marland Kitchen  History of Present Illness: 40 yo WF presents for 2-3 wks for of feeling bad.   She was diagnosed with a UTI 2 wks ago.  She had a pan sensitive cx (E. Coli)  but never improved with abx.  (Cipro 500 mg q 12 hrs).  Her 2nd cx was normal.  A CBC and CMP were normal.  She was treated on 7/27 with Bactrim DS but this did not help.   she has felt worse today with R flank pain (constant), nightsweats, mailaise, nausea and anorexia.  She has been running a fever at night and has been unable to work.  Denies vomitting or diarrhea.      Current Medications (verified): 1)  Prozac 20 Mg Caps (Fluoxetine Hcl) .Marland Kitchen.. 1 Capsule By Mouth Daily 2)  Alprazolam 0.5 Mg Tabs (Alprazolam) .... 1/2 To 1 Tab By Mouth Daily As Needed For Anxiety 3)  Enalapril Maleate 5 Mg Tabs (Enalapril Maleate) .Marland Kitchen.. 1 Tab By Mouth Daily 4)  Omeprazole 40 Mg Cpdr (Omeprazole) .Marland Kitchen.. 1 Tab By Mouth Daily 5)  Jolessa 0.15-0.03 Mg Tabs (Levonorgest-Eth Estrad 91-Day) .Marland Kitchen.. 1 Tab By Mouth Daily 6)  Nitroglycerin 0.4 Mg/hr Pt24 (Nitroglycerin) .... Use 1/4 Patch Daily To Rt Achilles Tendon 7)  Flexeril 10 Mg Tabs (Cyclobenzaprine Hcl) .Marland Kitchen.. 1 Tab By Mouth At Bedtime As Needed Headache/ Back Pain 8)  Methylphenidate Hcl 20 Mg Tabs (Methylphenidate Hcl) .Marland Kitchen.. 1 Tab By Mouth Two Times A Day As Needed 9)  Fish Oil 1000 Mg Caps (Omega-3 Fatty Acids) .... Take 2 Capsules By Mouth Once A Day  Allergies (verified): No Known Drug Allergies  Past  History:  Past Medical History: Reviewed history from 09/16/2009 and no changes required. G0 obese ADD- was on Prozac, Ritalin, Citalopram Depression IBS, neg test for Celiac Sprue ?PMDD ovarian cyst -- ruptured 11-2008 achilles injury R leg -- Dr Roanna Epley  Family History: Reviewed history from 04/07/2009 and no changes required. mother skin cancer father prostate cancer at 52, AMI at 78 brother CVA at 37 neg fpor bone problems  Social History: Reviewed history from 10/31/2007 and no changes required. 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 Married to [place].  No kids. Never smoked. 1 ETOH/ wk. Occas ETOH. 80# wt gain in 1 yr. Poor diet.  No exercise.  Review of Systems      See HPI  Physical Exam  General:  alert, well-developed, well-nourished, and well-hydrated.  obese Head:  normocephalic and atraumatic.   Eyes:  sclera non icteric Mouth:  pharynx pink and moist.   Neck:  no masses.   Lungs:  Normal respiratory effort, chest expands symmetrically. Lungs are clear to auscultation, no crackles or wheezes. Heart:  normal rate, regular rhythm,  and no murmur.   Abdomen:  suprapubic and R CVAT. soft.  ND.  hypoactive BS.   Extremities:  no LE edema Skin:  slight pallor Cervical Nodes:  No lymphadenopathy noted Psych:  good eye contact, not anxious appearing, and not depressed appearing.     Impression & Recommendations:  Problem # 1:  BACK PAIN (ICD-724.5) 2+ wks of R flank pain, recent UTI (treated) but not clinically improving concerning for a perinephric abscess given fatigue, nightsweats and fevers.    CT Abd/ Pelvis today.  F/U results tonight.  Send to the hosp if +. Rocephin 1 gram IM given today for probable perinephric abscess. Hemodynamically stable now.  Recent CBC and CMP normal.  UA normal.   Her updated medication list for this problem includes:    Flexeril 10 Mg Tabs (Cyclobenzaprine hcl) .Marland Kitchen... 1 tab by mouth at bedtime  as needed headache/ back pain  Orders: T-CT Abdomen/pelvis w (62831)  Complete Medication List: 1)  Prozac 20 Mg Caps (Fluoxetine hcl) .Marland Kitchen.. 1 capsule by mouth daily 2)  Alprazolam 0.5 Mg Tabs (Alprazolam) .... 1/2 to 1 tab by mouth daily as needed for anxiety 3)  Enalapril Maleate 5 Mg Tabs (Enalapril maleate) .Marland Kitchen.. 1 tab by mouth daily 4)  Omeprazole 40 Mg Cpdr (Omeprazole) .Marland Kitchen.. 1 tab by mouth daily 5)  Jolessa 0.15-0.03 Mg Tabs (Levonorgest-eth estrad 91-day) .Marland Kitchen.. 1 tab by mouth daily 6)  Nitroglycerin 0.4 Mg/hr Pt24 (Nitroglycerin) .... Use 1/4 patch daily to rt achilles tendon 7)  Flexeril 10 Mg Tabs (Cyclobenzaprine hcl) .Marland Kitchen.. 1 tab by mouth at bedtime as needed headache/ back pain 8)  Methylphenidate Hcl 20 Mg Tabs (Methylphenidate hcl) .Marland Kitchen.. 1 tab by mouth two times a day as needed 9)  Fish Oil 1000 Mg Caps (Omega-3 fatty acids) .... Take 2 capsules by mouth once a day  Other Orders: Rocephin  250mg  (D1761) Admin of Therapeutic Inj  intramuscular or subcutaneous (60737)   Medication Administration  Injection # 1:    Medication: Rocephin  250mg     Diagnosis: UTI (ICD-599.0)    Route: IM    Site: LUOQ gluteus    Exp Date: 08/2012    Lot #: [id number]    Patient tolerated injection without complications    Given by: Payton Spark CMA (January 28, 2010 2:19 PM)  Orders Added: 1)  T-CT Abdomen/pelvis w [48546] 2)  Est. Patient Level III [27035] 3)  Rocephin  250mg  [J0696] 4)  Admin of Therapeutic Inj  intramuscular or subcutaneous [00938]

## 2010-07-28 NOTE — Miscellaneous (Signed)
Summary: EGD/ colonoscopy  Clinical Lists Changes  Observations: Added new observation of COLONRECACT: Repeat colonoscopy in 10 years.   (02/06/2010 14:39) Added new observation of COLONOSCOPY: [facility]  Internal Hemorrhoids  Diverticulosos of the sigmoid and descending colon  o/w normal (02/06/2010 14:39) Added new observation of EGD: [facility]  A medial sized hiatal hernia (02/06/2010 14:38)      EGD  Procedure date:  02/06/2010  Findings:      [facility]  A medial sized hiatal hernia  Comments:      she will ge having a HIDA scan for f/u symptoms. HyoMax started instead of Dicyclomine.  Colonoscopy  Procedure date:  02/06/2010  Findings:      [facility]  Internal Hemorrhoids  Diverticulosos of the sigmoid and descending colon  o/w normal  Comments:      Repeat colonoscopy in 10 years.     EGD  Procedure date:  02/06/2010  Findings:      [facility]  A medial sized hiatal hernia  Comments:      she will ge having a HIDA scan for f/u symptoms. HyoMax started instead of Dicyclomine.  Colonoscopy  Procedure date:  02/06/2010  Findings:      [facility]  Internal Hemorrhoids  Diverticulosos of the sigmoid and descending colon  o/w normal  Comments:      Repeat colonoscopy in 10 years.

## 2010-07-28 NOTE — Progress Notes (Signed)
Summary: Still in pain  Phone Note Call from Patient   Caller: Patient Summary of Call: Pt LMOM stating she is still in a lot of pain. She is having abd pain, back pain and dysuria. Pt would like to know what she should do. Please advise. Initial call taken by: Payton Spark CMA,  January 28, 2010 10:49 AM  Follow-up for Phone Call        Can she come in at 1:00 to see me today? Follow-up by: Seymour Bars DO,  January 28, 2010 10:59 AM     Appended Document: Still in pain Pt scheduled

## 2010-07-28 NOTE — Assessment & Plan Note (Signed)
Summary: back pain-jr--Rm 4   Vital Signs:  Patient profile:   40 year old female Height:      67 inches Weight:      238.75 pounds BMI:     37.53 Temp:     97.9 degrees F oral Pulse rate:   84 / minute Pulse rhythm:   regular Resp:     16 per minute BP sitting:   116 / 80  (right arm) Cuff size:   large  Vitals Entered By: Mervin Kung CMA Duncan Dull) (January 12, 2010 10:33 AM) CC: Room 4  Pt states she doesn't feel well over all. Has been feeling fatigued x 3-4 weeks.  Has pain in the right side of her back x 2 months, now radiating to abdomen. Has hx of urinary frequency and incontinence x 1 year.  Has also noticed trembling episodes in both hands 5 times over a month. Is Patient Diabetic? No Pain Assessment Patient in pain? yes     Location: right side of back Intensity: 2 Type: dull Onset of pain  2 months ago,  yesterday pain was a 9.   Primary Care Provider:  Seymour Bars DO  CC:  Room 4  Pt states she doesn't feel well over all. Has been feeling fatigued x 3-4 weeks.  Has pain in the right side of her back x 2 months and now radiating to abdomen. Has hx of urinary frequency and incontinence x 1 year.  Has also noticed trembling episodes in both hands 5 times over a month..  History of Present Illness: Kristina Martinez is a 40 year old female who presents today with complaint of 3-4 week history of fatigue.   Notes that she also has pain on the right side fo her back x 2 months-  occasionally radiates to her abdomen.  Notes that she has work up about 1 year ago which included an abdominal ultrasound and hida scan which were reportedly negative.  Pain resolved until 2 months ago.  Nothing improves back pain.  Sometimes pain may be worse after a large meal.  She had subjective temp yesterday.  +diarrhea yesterday, + nausea yesterday, no vomitting.  Notes early satiety.  Also reports + stress incontinence x 1 year. Notes increased frequency of urination.   Allergies (verified): No  Known Drug Allergies  Review of Systems       see HPI  Physical Exam  General:  Well-developed,well-nourished,in no acute distress; alert,appropriate and cooperative throughout examination Head:  Normocephalic and atraumatic without obvious abnormalities. No apparent alopecia or balding. Lungs:  Normal respiratory effort, chest expands symmetrically. Lungs are clear to auscultation, no crackles or wheezes. Heart:  Normal rate and regular rhythm. S1 and S2 normal without gallop, murmur, click, rub or other extra sounds. Msk:  + tenderness lateral to thoracic spine on right.    Impression & Recommendations:  Problem # 1:  FATIGUE (ICD-780.79) Assessment New Will check CBC to rule out anemia.  Check TSH.   Check LFT's Orders: T-CBC with Diff [facility]) ([id number]) T-TSH [phone]) T-Hepatic Function [phone])  Problem # 2:  BACK PAIN (ICD-724.5) Assessment: Deteriorated  Suspect musculoskeletal pain.  Recommended trial of Aleve as needed.   The following medications were removed from the medication list:    Etodolac 400 Mg Tabs (Etodolac) .Marland Kitchen... 1 tab by mouth two times a day with food as needed for headache/ back pain Her updated medication list for this problem includes:    Flexeril 10 Mg Tabs (Cyclobenzaprine hcl) .Marland KitchenMarland KitchenMarland KitchenMarland Kitchen  1 tab by mouth at bedtime as needed headache/ back pain  Orders: UA Dipstick w/o Micro (automated)  (81003) Specimen Handling ([address])  Problem # 3:  UTI (ICD-599.0) Assessment: New UA mildly positive.  will send for culture.  If back pain persists consider CT with kidney stone protocol.   Her updated medication list for this problem includes:    Cipro 500 Mg Tabs (Ciprofloxacin hcl) ..... One tablet by mouth two times a day x 3 days  Complete Medication List: 1)  Prozac 20 Mg Caps (Fluoxetine hcl) .Marland Kitchen.. 1 capsule by mouth daily 2)  Alprazolam 0.5 Mg Tabs (Alprazolam) .... 1/2 to 1 tab by mouth daily as needed for anxiety 3)  Enalapril Maleate 5 Mg  Tabs (Enalapril maleate) .Marland Kitchen.. 1 tab by mouth daily 4)  Omeprazole 40 Mg Cpdr (Omeprazole) .Marland Kitchen.. 1 tab by mouth daily 5)  Jolessa 0.15-0.03 Mg Tabs (Levonorgest-eth estrad 91-day) .Marland Kitchen.. 1 tab by mouth daily 6)  Nitroglycerin 0.4 Mg/hr Pt24 (Nitroglycerin) .... Use 1/4 patch daily to rt achilles tendon 7)  Flexeril 10 Mg Tabs (Cyclobenzaprine hcl) .Marland Kitchen.. 1 tab by mouth at bedtime as needed headache/ back pain 8)  Methylphenidate Hcl 20 Mg Tabs (Methylphenidate hcl) .Marland Kitchen.. 1 tab by mouth two times a day as needed 9)  Fish Oil 1000 Mg Caps (Omega-3 fatty acids) .... Take 2 capsules by mouth once a day 10)  Cipro 500 Mg Tabs (Ciprofloxacin hcl) .... One tablet by mouth two times a day x 3 days  Other Orders: T-Culture, Urine ([id number])  Patient Instructions: 1)  Please complete your labs downstairs on the first floor.   2)  Try Aleve 220mg  twice daily as neede for pain. 3)  Follow up in 1 month. 4)  Please call if your back pain worsens or does not improve in 1 week. Prescriptions: CIPRO 500 MG TABS (CIPROFLOXACIN HCL) One tablet by mouth two times a day x 3 days  #6 x 0   Entered and Authorized by:   Lemont Fillers FNP   Signed by:   Lemont Fillers FNP on 01/12/2010   Method used:   Electronically to        [facility]* (retail)       [address] [place], [place]  [address]       Ph: [phone]       Fax: [phone]   RxID:   [id number]   Current Allergies (reviewed today): No known allergies   Laboratory Results   Urine Tests  Date/Time Received: 01/12/10 Date/Time Reported: Mervin Kung CMA (AAMA)  January 12, 2010 11:11 AM   Routine Urinalysis   Color: yellow Appearance: Clear Glucose: negative   (Normal Range: Negative) Bilirubin: large   (Normal Range: Negative) Ketone: negative   (Normal Range: Negative) Spec. Gravity: <1.005   (Normal Range: 1.003-1.035) Blood: trace-intact   (Normal Range: Negative) pH: 6.5   (Normal Range:  5.0-8.0) Protein: negative   (Normal Range: Negative) Urobilinogen: 0.2   (Normal Range: 0-1) Nitrite: negative   (Normal Range: Negative) Leukocyte Esterace: small   (Normal Range: Negative)

## 2010-07-28 NOTE — Progress Notes (Signed)
Summary: Rx pain med  Phone Note Call from Patient   Caller: Patient Summary of Call: Pt states IBU is no longer helping w/ pain and she would like pain med sent to pharm. Initial call taken by: Payton Spark CMA,  January 29, 2010 9:51 AM  Follow-up for Phone Call        I called pt back to talk more about her CT findings.  Concern for mesenteric adenitis wit6h continued fevers/ chills/ anorexia, abdominal and thoracic back pain.  CBC with diff normal 7-28 at the ED.  Likely viral but will r/o cancerous causes -- will refer to GI for further eval of this and her large hiatal hernia.  Her L5 pars defect -- she knew about this already and she is NOT having lower back pain.  I will write her out of work thru tomorrow and send Vicodin to the pharmacy for pain.   Follow-up by: Seymour Bars DO,  January 29, 2010 11:43 AM  New Problems: NONSPECIFIC MESENTERIC LYMPHADENITIS (ICD-289.2)   New Problems: NONSPECIFIC MESENTERIC LYMPHADENITIS (ICD-289.2) New/Updated Medications: HYDROCODONE-ACETAMINOPHEN 5-500 MG TABS (HYDROCODONE-ACETAMINOPHEN) 1-2 tabs by mouth three times a day as needed pain, take with food Prescriptions: HYDROCODONE-ACETAMINOPHEN 5-500 MG TABS (HYDROCODONE-ACETAMINOPHEN) 1-2 tabs by mouth three times a day as needed pain, take with food  #24 x 0   Entered and Authorized by:   Seymour Bars DO   Signed by:   Seymour Bars DO on 01/29/2010   Method used:   Printed then faxed to ...       [facility]* (retail)       [address] [place], [place]  [address]       Ph: [phone]       Fax: [phone]   RxID:   [id number]

## 2010-07-28 NOTE — Letter (Signed)
Summary: Letter with Gallbladder Scan Results/[facility]  Letter with Gallbladder Scan Results/[facility]   Imported By: Lanelle Bal 02/18/2010 10:40:40  _____________________________________________________________________  External Attachment:    Type:   Image     Comment:   External Document

## 2010-07-28 NOTE — Assessment & Plan Note (Signed)
Summary: HA   Vital Signs:  Patient profile:   40 year old female Height:      67 inches Weight:      237 pounds BMI:     37.25 O2 Sat:      100 % on Room air Temp:     98.6 degrees F oral Pulse rate:   79 / minute BP sitting:   129 / 81  (left arm) Cuff size:   large  Vitals Entered By: Payton Spark CMA (September 16, 2009 3:18 PM)  O2 Flow:  Room air CC: Increased HAs. Worse in afternoons x 2 weeks.   Primary Care Provider:  Seymour Bars DO  CC:  Increased HAs. Worse in afternoons x 2 weeks.Kristina Martinez  History of Present Illness: 40 yo WF presents for problems with HAs.  She is getting them several x's a wk.  She talked to her dentist about jaw pain.  She stopped wearing a bite guard.  Her HAs are starting in the afternoon.  Her upper back has been hurting.  She has a distant hx of migraines from MSG.  She did start seeing flashes of light yesterday.  Her HAs are occipital  and around her jaw and sometimes behind her eyes.  She is taking Ibuprofen 800 mg every 4-6 hrs.  It is not helping.  She has nausea without vomitting.  No dizziness.  No photophobia but has phonophobia.   Pain seems to occur in the thoracic spine along w/ her HAs.  She was under a lot of stress until a wk ago.  This all started about 2 wks ago.  She is on OCPs but this is not new.  She is using NTG patches for an achilles tear but she's been on them for 3 months now.  She is getting ready for her periods to start but usually doesn't have migraines around them.      Current Medications (verified): 1)  Prozac 20 Mg Caps (Fluoxetine Hcl) .Kristina Martinez.. 1 Capsule By Mouth Daily 2)  Alprazolam 0.5 Mg Tabs (Alprazolam) .... 1/2 To 1 Tab By Mouth Daily As Needed For Anxiety 3)  Enalapril Maleate 5 Mg Tabs (Enalapril Maleate) .Kristina Martinez.. 1 Tab By Mouth Daily 4)  Omeprazole 40 Mg Cpdr (Omeprazole) .Kristina Martinez.. 1 Tab By Mouth Daily 5)  Jolessa 0.15-0.03 Mg Tabs (Levonorgest-Eth Estrad 91-Day) .Kristina Martinez.. 1 Tab By Mouth Daily 6)  Nitroglycerin 0.4 Mg/hr  Pt24 (Nitroglycerin) .... Use 1/4 Patch Daily To Rt Achilles Tendon  Allergies (verified): No Known Drug Allergies  Past History:  Past Medical History: G0 obese ADD- was on Prozac, Ritalin, Citalopram Depression IBS, neg test for Celiac Sprue ?PMDD ovarian cyst -- ruptured 11-2008 achilles injury R leg -- Dr Roanna Epley  Family History: Reviewed history from 04/07/2009 and no changes required. mother skin cancer father prostate cancer at 71, AMI at 19 brother CVA at 44 neg fpor bone problems  Social History: Reviewed history from 10/31/2007 and no changes required. 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 Married to [place].  No kids. Never smoked. 1 ETOH/ wk. Occas ETOH. 80# wt gain in 1 yr. Poor diet.  No exercise.  Review of Systems      See HPI  Physical Exam  General:  obese WF in NAD Head:  normocephalic and atraumatic.  no popping, clicking or tenderness over the TMJ Eyes:  pupils equal, pupils round, and pupils reactive to light.  wears glasses Nose:  no nasal discharge.  Mouth:  good dentition and pharynx pink and moist.   Neck:  no masses.   Lungs:  Normal respiratory effort, chest expands symmetrically. Lungs are clear to auscultation, no crackles or wheezes. Heart:  Normal rate and regular rhythm. S1 and S2 normal without gallop, murmur, click, rub or other extra sounds. Extremities:  no LE edema Neurologic:  cranial nerves II-XII intact and gait normal.   Skin:  color normal.   Cervical Nodes:  No lymphadenopathy noted Psych:  good eye contact, not anxious appearing, and flat affect.     Impression & Recommendations:  Problem # 1:  HEADACHE (ICD-784.0) New onset moderate to severe HAs x 2 wks almost daily with hx of chronic sinusitis, brother with CVA at 42 and high stress level. CT head today to r/o mass, bleed. DDX includes migraine HA, TMJ, bruxism, cervical DDD. Start on RX NSAID + Flexeril.  F/U in 7-10 days.   Consider SE from NTG pain causing HAs. BP is at goal. Time spent 20 min face to face.  Her updated medication list for this problem includes:    Etodolac 400 Mg Tabs (Etodolac) .Kristina Martinez... 1 tab by mouth two times a day with food as needed for headache/ back pain  Orders: T-CT Head w/o cm ([address]) T-Comprehensive Metabolic Panel ([id number])  Complete Medication List: 1)  Prozac 20 Mg Caps (Fluoxetine hcl) .Kristina Martinez.. 1 capsule by mouth daily 2)  Alprazolam 0.5 Mg Tabs (Alprazolam) .... 1/2 to 1 tab by mouth daily as needed for anxiety 3)  Enalapril Maleate 5 Mg Tabs (Enalapril maleate) .Kristina Martinez.. 1 tab by mouth daily 4)  Omeprazole 40 Mg Cpdr (Omeprazole) .Kristina Martinez.. 1 tab by mouth daily 5)  Jolessa 0.15-0.03 Mg Tabs (Levonorgest-eth estrad 91-day) .Kristina Martinez.. 1 tab by mouth daily 6)  Nitroglycerin 0.4 Mg/hr Pt24 (Nitroglycerin) .... Use 1/4 patch daily to rt achilles tendon 7)  Flexeril 10 Mg Tabs (Cyclobenzaprine hcl) .Kristina Martinez.. 1 tab by mouth at bedtime as needed headache/ back pain 8)  Etodolac 400 Mg Tabs (Etodolac) .Kristina Martinez.. 1 tab by mouth two times a day with food as needed for headache/ back pain  Other Orders: T-TSH ([id number]) T-CBC w/Diff ([id number])  Patient Instructions: 1)  CT Head today. 2)  Will call you w/ results tomorrow. 3)  Labs today. 4)  Will call you w/ results tomorrow. 5)  Use Flexeril and Etodolac as needed for headaches/ back pain. 6)  F/U in 7 days. Prescriptions: ETODOLAC 400 MG TABS (ETODOLAC) 1 tab by mouth two times a day with food as needed for headache/ back pain  #60 x 0   Entered and Authorized by:   Seymour Bars DO   Signed by:   Seymour Bars DO on 09/16/2009   Method used:   Electronically to        [facility]* (retail)       [address] [place], [place]  [address]       Ph: [phone]       Fax: [phone]   RxID:   [id number] FLEXERIL 10 MG TABS (CYCLOBENZAPRINE HCL) 1 tab by mouth at bedtime as needed headache/ back pain  #30 x 0   Entered and  Authorized by:   Seymour Bars DO   Signed by:   Seymour Bars DO on 09/16/2009   Method used:   Electronically to        [facility]* (retail)       [address] [place]  [address]       Ph: [phone]       Fax: [phone]   RxID:   [id number]

## 2010-07-28 NOTE — Letter (Signed)
   [place] [facility] [address] [place], [place]  [address]  [place] Phone: [phone]      January 13, 2010   [facility] [address] [place], [place] [address]  RE:  LAB RESULTS  Dear  Ms. Martindelcampo,  The following is an interpretation of your most recent lab tests.  Please take note of any instructions provided or changes to medications that have resulted from your lab work.  LIVER FUNCTION TESTS:  Good - no changes needed   THYROID STUDIES:  Thyroid studies normal TSH: 0.764     CBC:  Stable - no changes needed   Sincerely Yours,    Lemont Fillers FNP  Appended Document:  Letter mailed.

## 2010-07-30 NOTE — Assessment & Plan Note (Signed)
Summary: breast irritation   Vital Signs:  Patient profile:   40 year old female Height:      67 inches Weight:      230 pounds Pulse rate:   80 / minute BP sitting:   123 / 78  (left arm) Cuff size:   large  Vitals Entered By: Kathlene November LPN (June 15, 2010 8:06 AM) CC: bump on left nipple and soreness of left breast   Primary Care Provider:  Seymour Bars DO  CC:  bump on left nipple and soreness of left breast.  History of Present Illness: 40 yo WF presents for a bump on the L nipple x 1 wk.  She has noted tenderness and growing in size.  She did have some oozing after she picked it.    She has not had a mammogram since turning 38. Both grandmas had breast cancer, both postmenopausal.   She is o/w feeling well.  Due for some med RFs today.    Current Medications (verified): 1)  Prozac 20 Mg Caps (Fluoxetine Hcl) .Marland Kitchen.. 1 Capsule By Mouth Daily 2)  Alprazolam 0.5 Mg Tabs (Alprazolam) .... 1/2 To 1 Tab By Mouth Daily As Needed For Anxiety 3)  Enalapril Maleate 5 Mg Tabs (Enalapril Maleate) .Marland Kitchen.. 1 Tab By Mouth Daily 4)  Omeprazole 40 Mg Cpdr (Omeprazole) .Marland Kitchen.. 1 Tab By Mouth Daily 5)  Jolessa 0.15-0.03 Mg Tabs (Levonorgest-Eth Estrad 91-Day) .Marland Kitchen.. 1 Tab By Mouth Daily 6)  Nitroglycerin 0.4 Mg/hr Pt24 (Nitroglycerin) .... Use 1/4 Patch Daily To Rt Achilles Tendon 7)  Flexeril 10 Mg Tabs (Cyclobenzaprine Hcl) .Marland Kitchen.. 1 Tab By Mouth At Bedtime As Needed Headache/ Back Pain 8)  Methylphenidate Hcl 20 Mg Tabs (Methylphenidate Hcl) .Marland Kitchen.. 1 Tab By Mouth Two Times A Day As Needed 9)  Fish Oil 1000 Mg Caps (Omega-3 Fatty Acids) .... Take 2 Capsules By Mouth Once A Day  Allergies (verified): No Known Drug Allergies  Comments:  Nurse/Medical Assistant: The patient's medications and allergies were reviewed with the patient and were updated in the Medication and Allergy Lists. Kathlene November LPN (June 15, 2010 8:07 AM)  Past History:  Past Medical History: Reviewed history from  09/16/2009 and no changes required. G0 obese ADD- was on Prozac, Ritalin, Citalopram Depression IBS, neg test for Celiac Sprue ?PMDD ovarian cyst -- ruptured 11-2008 achilles injury R leg -- Dr Roanna Epley  Past Surgical History: lap chole 2011  Social History: Reviewed history from 10/31/2007 and no changes required. 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  No kids. Never smoked. 1 ETOH/ wk. Occas ETOH. 80# wt gain in 1 yr. Poor diet.  No exercise.  Review of Systems      See HPI  Physical Exam  General:  alert, well-developed, well-nourished, and well-hydrated.   Head:  normocephalic and atraumatic.   Neck:  no masses.   Lungs:  Normal respiratory effort, chest expands symmetrically. Lungs are clear to auscultation, no crackles or wheezes. Heart:  Normal rate and regular rhythm. S1 and S2 normal without gallop, murmur, click, rub or other extra sounds. Skin:  color normal.  irritated (slight) L lateral aerolar gland w/o erythema.  Slightly raised.  No drainage.   Cervical Nodes:  No lymphadenopathy noted Axillary Nodes:  No palpable lymphadenopathy   Impression & Recommendations:  Problem # 1:  BREAST PAIN, LEFT (ICD-611.71) Slight L breast pain due to irriated aerolar gland.  Very minimal on exam with slightly raised  appearance.  Will update her mammogram simply b/c she is due for screening.  Keep clean and use topical Neosporin 2 x a day x 1 wk.  Call if any problems.  Problem # 2:  ANXIETY DISORDER (ICD-300.00) RFd meds.  She is due for a CPE in March. Her updated medication list for this problem includes:    Prozac 20 Mg Caps (Fluoxetine hcl) .Marland Kitchen... 1 capsule by mouth daily    Alprazolam 0.5 Mg Tabs (Alprazolam) .Marland Kitchen... 1/2 to 1 tab by mouth daily as needed for anxiety  Complete Medication List: 1)  Prozac 20 Mg Caps (Fluoxetine hcl) .Marland Kitchen.. 1 capsule by mouth daily 2)  Alprazolam 0.5 Mg Tabs (Alprazolam) .... 1/2 to 1 tab by  mouth daily as needed for anxiety 3)  Enalapril Maleate 5 Mg Tabs (Enalapril maleate) .Marland Kitchen.. 1 tab by mouth daily 4)  Omeprazole 40 Mg Cpdr (Omeprazole) .Marland Kitchen.. 1 tab by mouth daily 5)  Jolessa 0.15-0.03 Mg Tabs (Levonorgest-eth estrad 91-day) .Marland Kitchen.. 1 tab by mouth daily 6)  Nitroglycerin 0.4 Mg/hr Pt24 (Nitroglycerin) .... Use 1/4 patch daily to rt achilles tendon 7)  Flexeril 10 Mg Tabs (Cyclobenzaprine hcl) .Marland Kitchen.. 1 tab by mouth at bedtime as needed headache/ back pain 8)  Methylphenidate Hcl 20 Mg Tabs (Methylphenidate hcl) .Marland Kitchen.. 1 tab by mouth two times a day as needed 9)  Fish Oil 1000 Mg Caps (Omega-3 fatty acids) .... Take 2 capsules by mouth once a day  Other Orders: T-Mammography Bilateral Screening ([address])  Patient Instructions: 1)  Update mammogram downstairs. 2)  Keep irritated areolar gland clean with soap and water and apply topical neosporin 2 x a day for another wk. 3)  Meds RFd. 4)  BP looks great. 5)  Return for PHYSICAL with PAP smear in March Prescriptions: METHYLPHENIDATE HCL 20 MG TABS (METHYLPHENIDATE HCL) 1 tab by mouth two times a day as needed  #60 x 0   Entered and Authorized by:   Seymour Bars DO   Signed by:   Seymour Bars DO on 06/15/2010   Method used:   Print then Give to Patient   RxID:   [id number] ALPRAZOLAM 0.5 MG TABS (ALPRAZOLAM) 1/2 to 1 tab by mouth daily as needed for anxiety  #30 x 0   Entered and Authorized by:   Seymour Bars DO   Signed by:   Seymour Bars DO on 06/15/2010   Method used:   Print then Give to Patient   RxID:   [id number]    Orders Added: 1)  T-Mammography Bilateral Screening [77057] 2)  Est. Patient Level III [13244]

## 2010-07-30 NOTE — Assessment & Plan Note (Signed)
Summary: irregular menses   Vital Signs:  Patient profile:   40 year old female Height:      67 inches Weight:      231 pounds BMI:     36.31 O2 Sat:      98 % on Room air Temp:     99.2 degrees F oral Pulse rate:   92 / minute BP sitting:   126 / 77  (left arm) Cuff size:   large  Vitals Entered By: Payton Spark CMA (July 01, 2010 10:45 AM)  O2 Flow:  Room air CC: Bleeding from vagina w/ BM x 4 days.   Primary Care Provider:  Seymour Bars DO  CC:  Bleeding from vagina w/ BM x 4 days.Kristina Martinez  History of Present Illness: 40 yo WF presents for c/o having vaginal bleeding which started 4 days ago.  Her previous menses was 3 wks before then and was not supposed to be having her period since she is on [place].  Ever since then, she has had vag bleeding with each BM.  She has had some mild straining.  Denies any rectal bleeding.  Denies pain with defecation.      The vaginal bleeding was like a heavy menses but she is not bleeding outside of having a BM.  She o/w feels well.   Current Medications (verified): 1)  Prozac 20 Mg Caps (Fluoxetine Hcl) .Kristina Martinez.. 1 Capsule By Mouth Daily 2)  Alprazolam 0.5 Mg Tabs (Alprazolam) .... 1/2 To 1 Tab By Mouth Daily As Needed For Anxiety 3)  Enalapril Maleate 5 Mg Tabs (Enalapril Maleate) .Kristina Martinez.. 1 Tab By Mouth Daily 4)  Omeprazole 40 Mg Cpdr (Omeprazole) .Kristina Martinez.. 1 Tab By Mouth Daily 5)  Jolessa 0.15-0.03 Mg Tabs (Levonorgest-Eth Estrad 91-Day) .Kristina Martinez.. 1 Tab By Mouth Daily 6)  Nitroglycerin 0.4 Mg/hr Pt24 (Nitroglycerin) .... Use 1/4 Patch Daily To Rt Achilles Tendon 7)  Methylphenidate Hcl 20 Mg Tabs (Methylphenidate Hcl) .Kristina Martinez.. 1 Tab By Mouth Two Times A Day As Needed 8)  Fish Oil 1000 Mg Caps (Omega-3 Fatty Acids) .... Take 2 Capsules By Mouth Once A Day  Allergies (verified): No Known Drug Allergies  Past History:  Past Medical History: Reviewed history from 09/16/2009 and no changes required. G0 obese ADD- was on Prozac, Ritalin,  Citalopram Depression IBS, neg test for Celiac Sprue ?PMDD ovarian cyst -- ruptured 11-2008 achilles injury R leg -- Dr Roanna Epley  Past Surgical History: Reviewed history from 06/15/2010 and no changes required. lap chole 2011  Social History: Reviewed history from 10/31/2007 and no changes required. 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 Married to [place].  No kids. Never smoked. 1 ETOH/ wk. Occas ETOH. 80# wt gain in 1 yr. Poor diet.  No exercise.  Review of Systems      See HPI  Physical Exam  General:  alert, well-developed, well-nourished, and well-hydrated.  obese Head:  normocephalic and atraumatic.   Eyes:  conjunctival pallor Mouth:  good dentition and pharynx pink and moist.   Neck:  no masses.   Lungs:  Normal respiratory effort, chest expands symmetrically. Lungs are clear to auscultation, no crackles or wheezes. Heart:  RRR with +2/6 systolic M over the RUSB Abdomen:  soft and non-tender.   Genitalia:  normal introitus, no external lesions, no friaility or hemorrhage, normal uterus size and position, and no adnexal masses or tenderness.  mild bleeding. Skin:  color normal.   Cervical Nodes:  No lymphadenopathy noted  Impression & Recommendations:  Problem # 1:  IRREGULAR MENSTRUAL CYCLE (ICD-626.4) Breakthrough bleeding on extended cycle OCPs x 1-2 yrs.  Explained to pt that this can be normal and expected. Will have her hold her Jolessa x 3 days to allow shedding and then resume pill pack.  Call if heavy bleeding or pelvic pain.  Normal bimanual exam today.   Her updated medication list for this problem includes:    Jolessa 0.15-0.03 Mg Tabs (Levonorgest-eth estrad 91-day) .Kristina Martinez... 1 tab by mouth daily  Problem # 2:  CARDIAC MURMUR (ICD-785.2) Assessment: New 2/6 systolic murmur present today, asymptomatic with normal BP. Suspect that this is secondary to iron def.  Will screen for today and recheck heart exam in 4 wks. If iron  is normal and murmur persists, will get an echo.  Complete Medication List: 1)  Prozac 20 Mg Caps (Fluoxetine hcl) .Kristina Martinez.. 1 capsule by mouth daily 2)  Alprazolam 0.5 Mg Tabs (Alprazolam) .... 1/2 to 1 tab by mouth daily as needed for anxiety 3)  Enalapril Maleate 5 Mg Tabs (Enalapril maleate) .Kristina Martinez.. 1 tab by mouth daily 4)  Omeprazole 40 Mg Cpdr (Omeprazole) .Kristina Martinez.. 1 tab by mouth daily 5)  Jolessa 0.15-0.03 Mg Tabs (Levonorgest-eth estrad 91-day) .Kristina Martinez.. 1 tab by mouth daily 6)  Nitroglycerin 0.4 Mg/hr Pt24 (Nitroglycerin) .... Use 1/4 patch daily to rt achilles tendon 7)  Methylphenidate Hcl 20 Mg Tabs (Methylphenidate hcl) .Kristina Martinez.. 1 tab by mouth two times a day as needed 8)  Fish Oil 1000 Mg Caps (Omega-3 fatty acids) .... Take 2 capsules by mouth once a day  Other Orders: T-CBC No Diff (16109-60454) T-Iron (09811-91478) T-Iron Binding Capacity (TIBC) ([address]) T-Ferritin ([id number])  Patient Instructions: 1)  Hold Jolessa for 3 days for menstrual bleeding, then resume the same pill pack. 2)  Call if having heavy bleeding or pelvic pain.   3)  Check iron levels today. 4)  Return to recheck heart murmur in 4 wks.   Orders Added: 1)  T-CBC No Diff [[id number]] 2)  T-Iron [[id number]] 3)  T-Iron Binding Capacity (TIBC) [[address]] 4)  T-Ferritin [82728-23350] 5)  Est. Patient Level IV [44034]

## 2010-09-12 LAB — BASIC METABOLIC PANEL
BUN: 11 mg/dL (ref 6–23)
Chloride: 104 mEq/L (ref 96–112)
Creatinine, Ser: 0.7 mg/dL (ref 0.4–1.2)
Glucose, Bld: 102 mg/dL — ABNORMAL HIGH (ref 70–99)
Potassium: 4.4 mEq/L (ref 3.5–5.1)

## 2010-09-12 LAB — CBC
HCT: 39.2 % (ref 36.0–46.0)
MCHC: 32.9 g/dL (ref 30.0–36.0)
MCV: 81.9 fL (ref 78.0–100.0)
Platelets: 303 10*3/uL (ref 150–400)
RDW: 13.7 % (ref 11.5–15.5)
WBC: 8.5 10*3/uL (ref 4.0–10.5)

## 2010-09-12 LAB — URINALYSIS, ROUTINE W REFLEX MICROSCOPIC
Bilirubin Urine: NEGATIVE
Glucose, UA: NEGATIVE mg/dL
Hgb urine dipstick: NEGATIVE
Ketones, ur: NEGATIVE mg/dL
Protein, ur: NEGATIVE mg/dL
pH: 6 (ref 5.0–8.0)

## 2010-09-12 LAB — DIFFERENTIAL
Basophils Absolute: 0.1 10*3/uL (ref 0.0–0.1)
Basophils Relative: 1 % (ref 0–1)
Eosinophils Absolute: 0.3 10*3/uL (ref 0.0–0.7)
Eosinophils Relative: 4 % (ref 0–5)
Lymphocytes Relative: 21 % (ref 12–46)
Monocytes Absolute: 0.8 10*3/uL (ref 0.1–1.0)

## 2010-09-21 ENCOUNTER — Other Ambulatory Visit: Payer: Self-pay | Admitting: *Deleted

## 2010-09-21 DIAGNOSIS — K219 Gastro-esophageal reflux disease without esophagitis: Secondary | ICD-10-CM

## 2010-09-21 MED ORDER — OMEPRAZOLE 40 MG PO CPDR: 40.0000 mg | DELAYED_RELEASE_CAPSULE | Freq: Every day | ORAL | Status: DC

## 2010-09-28 LAB — DIFFERENTIAL
Basophils Absolute: 0 10*3/uL (ref 0.0–0.1)
Eosinophils Absolute: 0 10*3/uL (ref 0.0–0.7)
Eosinophils Relative: 0 % (ref 0–5)
Lymphocytes Relative: 10 % — ABNORMAL LOW (ref 12–46)
Neutrophils Relative %: 89 % — ABNORMAL HIGH (ref 43–77)

## 2010-09-28 LAB — URINALYSIS, ROUTINE W REFLEX MICROSCOPIC
Hgb urine dipstick: NEGATIVE
Ketones, ur: 15 mg/dL — AB
Nitrite: NEGATIVE
Protein, ur: NEGATIVE mg/dL
Urobilinogen, UA: 1 mg/dL (ref 0.0–1.0)

## 2010-09-28 LAB — COMPREHENSIVE METABOLIC PANEL
ALT: 19 U/L (ref 0–35)
AST: 20 U/L (ref 0–37)
CO2: 23 mEq/L (ref 19–32)
Chloride: 105 mEq/L (ref 96–112)
Creatinine, Ser: 0.7 mg/dL (ref 0.4–1.2)
GFR calc Af Amer: >60 mL/min (ref >60)
GFR calc non Af Amer: >60 mL/min (ref >60)
Glucose, Bld: 158 mg/dL — ABNORMAL HIGH (ref 70–99)
Total Bilirubin: 0.5 mg/dL (ref 0.3–1.2)

## 2010-09-28 LAB — CBC
HCT: 38.5 % (ref 36.0–46.0)
Hemoglobin: 13 g/dL (ref 12.0–15.0)
MCHC: 33.8 g/dL (ref 30.0–36.0)
MCV: 82.6 fL (ref 78.0–100.0)
RBC: 4.66 MIL/uL (ref 3.87–5.11)
WBC: 10.2 10*3/uL (ref 4.0–10.5)

## 2010-09-28 LAB — URINE MICROSCOPIC-ADD ON

## 2010-09-28 LAB — PREGNANCY, URINE: Preg Test, Ur: NEGATIVE

## 2010-10-05 LAB — COMPREHENSIVE METABOLIC PANEL
ALT: 6 U/L (ref 0–35)
AST: 19 U/L (ref 0–37)
Albumin: 3.8 g/dL (ref 3.5–5.2)
Alkaline Phosphatase: 137 U/L — ABNORMAL HIGH (ref 39–117)
Potassium: 3.9 mEq/L (ref 3.5–5.1)
Sodium: 141 mEq/L (ref 135–145)
Total Protein: 7.1 g/dL (ref 6.0–8.3)

## 2010-10-05 LAB — URINALYSIS, ROUTINE W REFLEX MICROSCOPIC
Bilirubin Urine: NEGATIVE
Glucose, UA: NEGATIVE mg/dL
Ketones, ur: NEGATIVE mg/dL
Protein, ur: NEGATIVE mg/dL

## 2010-10-05 LAB — URINE MICROSCOPIC-ADD ON

## 2010-10-05 LAB — CBC
Platelets: 371 10*3/uL (ref 150–400)
RDW: 13.3 % (ref 11.5–15.5)
WBC: 9.3 10*3/uL (ref 4.0–10.5)

## 2010-10-05 LAB — DIFFERENTIAL
Basophils Relative: 1 % (ref 0–1)
Eosinophils Absolute: 0.5 10*3/uL (ref 0.0–0.7)
Eosinophils Relative: 5 % (ref 0–5)
Monocytes Absolute: 0.6 10*3/uL (ref 0.1–1.0)
Monocytes Relative: 7 % (ref 3–12)

## 2010-10-05 LAB — PREGNANCY, URINE: Preg Test, Ur: NEGATIVE

## 2010-10-20 ENCOUNTER — Encounter: Payer: Self-pay | Admitting: *Deleted

## 2010-11-15 ENCOUNTER — Other Ambulatory Visit: Payer: Self-pay | Admitting: Sports Medicine

## 2010-12-10 ENCOUNTER — Ambulatory Visit (INDEPENDENT_AMBULATORY_CARE_PROVIDER_SITE_OTHER): Payer: BC Managed Care – PPO | Admitting: Family Medicine

## 2010-12-10 ENCOUNTER — Encounter: Payer: Self-pay | Admitting: Family Medicine

## 2010-12-10 VITALS — BP 137/89 | HR 82 | Temp 99.4°F | Ht 67.0 in | Wt 240.0 lb

## 2010-12-10 DIAGNOSIS — R011 Cardiac murmur, unspecified: Secondary | ICD-10-CM

## 2010-12-10 DIAGNOSIS — B839 Helminthiasis, unspecified: Secondary | ICD-10-CM | POA: Insufficient documentation

## 2010-12-10 NOTE — Patient Instructions (Signed)
Stool sent to lab for ova and parasite.  Will call you w/ results tomorrow. Will treat you according to the results and may need to get additional bloodwork done.  Call me if any problems.

## 2010-12-10 NOTE — Progress Notes (Signed)
  Subjective:    Patient ID: Kristina Martinez, female    DOB: [birth year], 40 y.o.   MRN: [id number]  HPI  40 yo WF presents for ? Worms in her stool this morning.  She had had a 'sensitive stomache' but no recent diarrhea.  No rectal itching.  She denies nausea, vomitting, fevers or chills.  She has 7 cats and takes care of her brother in law who is handicapt.  Denies blood in the stool.  BP 137/89  Pulse 82  Temp(Src) 99.4 F (37.4 C) (Oral)  Ht 5\' 7"  (1.702 m)  Wt 240 lb (108.863 kg)  BMI 37.59 kg/m2  SpO2 99%  LMP 12/07/2010   Review of Systems  Constitutional: Negative for fever, chills, appetite change and fatigue.  Cardiovascular: Negative for chest pain.  Gastrointestinal: Positive for abdominal distention. Negative for nausea, vomiting, abdominal pain, diarrhea, constipation, blood in stool and rectal pain.  Genitourinary: Negative for hematuria.  Skin: Negative for rash.  Neurological: Negative for dizziness and headaches.       Objective:   Physical Exam  Constitutional: She appears well-developed and well-nourished. No distress.       obese  HENT:  Mouth/Throat: Oropharynx is clear and moist.  Eyes: No scleral icterus.  Neck: Neck supple. No thyromegaly present.  Cardiovascular: Normal rate and regular rhythm.   Murmur heard. Abdominal: Soft. Bowel sounds are normal. She exhibits no distension. There is no tenderness. There is no guarding.  Musculoskeletal: She exhibits no edema.  Lymphadenopathy:    She has no cervical adenopathy.  Skin: Skin is warm and dry. No rash noted. No pallor.  Psychiatric: She has a normal mood and affect.          Assessment & Plan:

## 2010-12-10 NOTE — Assessment & Plan Note (Signed)
It does appear that she has worms in her stool from the specimen that she brought in today.  Will send in sterile cup to the lab to have this anaylzed under microscopy and f/u results.  Wait for results prior to treatment and may need to add a CBC with a CMP to r/o systemic problems from parasites.    Likley from her cats or from handicapt brother in Social [employer].  I suggested she bring her cats to the vet and to collect sample from other household members if needed.  She is not actively sick at this point.

## 2010-12-11 ENCOUNTER — Telehealth: Payer: Self-pay | Admitting: Family Medicine

## 2010-12-11 NOTE — Telephone Encounter (Signed)
Pls let pt know that her stool was negative for ova and parasite. I will order labs to do on Monday.

## 2010-12-14 ENCOUNTER — Telehealth: Payer: Self-pay | Admitting: Family Medicine

## 2010-12-14 NOTE — Telephone Encounter (Signed)
Pt called in to get the lab results from last week regarding her stool.  Gave the neg result for O&P test.  Pt said we were suppose to be ordering more labs today and I told pt I would have Dr. Cathey Endow nurse follow up with that and I would send a message to remind her.  Pt is also complaining of really bad pain in dead center below rib cage and pain radiating to back area  #7/10.  Pt also has nausea.  Pt does not have her gallbladder and wants to know if she could still get pancreatitis.  She had this last year, and feels her symptoms are mimicking the same as last year. Please advise the patient.  Didn't bring in for appt yet since just seen last week. Plan:  Routed to Dr. Arlice Colt, LPN [facility]

## 2010-12-14 NOTE — Telephone Encounter (Signed)
Lab order printed.  Try to draw today.  It doesn't matter if she ate.  Does she want RX for nausea? Bland diet, clear fluids and set up OV with me on WED.

## 2010-12-14 NOTE — Telephone Encounter (Signed)
Pt states she is in so much pain at this point and feels like she has pancreatitis. I advised Pt to go to ED. Pt agreed.

## 2010-12-18 ENCOUNTER — Other Ambulatory Visit: Payer: Self-pay | Admitting: *Deleted

## 2010-12-18 DIAGNOSIS — K219 Gastro-esophageal reflux disease without esophagitis: Secondary | ICD-10-CM

## 2010-12-18 MED ORDER — OMEPRAZOLE 40 MG PO CPDR: 40.0000 mg | DELAYED_RELEASE_CAPSULE | Freq: Every day | ORAL | Status: DC

## 2011-01-03 ENCOUNTER — Telehealth: Payer: Self-pay | Admitting: Family Medicine

## 2011-01-03 NOTE — Telephone Encounter (Signed)
Pls let pt know that her 2D echocardiogram of her heart looks normal.  She  Has normal valves, normal pumping function.

## 2011-01-04 NOTE — Telephone Encounter (Signed)
Pt aware of the above  

## 2011-01-21 ENCOUNTER — Ambulatory Visit
Admission: RE | Admit: 2011-01-21 | Discharge: 2011-01-21 | Disposition: A | Discharge status: Home / Self Care | Payer: BC Managed Care – PPO | Source: Ambulatory Visit | Attending: Family Medicine | Admitting: Family Medicine

## 2011-01-21 ENCOUNTER — Encounter: Payer: Self-pay | Admitting: Family Medicine

## 2011-01-21 ENCOUNTER — Ambulatory Visit (INDEPENDENT_AMBULATORY_CARE_PROVIDER_SITE_OTHER): Payer: BC Managed Care – PPO | Admitting: Family Medicine

## 2011-01-21 ENCOUNTER — Telehealth: Payer: Self-pay | Admitting: Family Medicine

## 2011-01-21 VITALS — BP 119/83 | HR 88 | Ht 67.0 in | Wt 240.0 lb

## 2011-01-21 DIAGNOSIS — R209 Unspecified disturbances of skin sensation: Secondary | ICD-10-CM

## 2011-01-21 DIAGNOSIS — M5412 Radiculopathy, cervical region: Secondary | ICD-10-CM

## 2011-01-21 DIAGNOSIS — R200 Anesthesia of skin: Secondary | ICD-10-CM

## 2011-01-21 MED ORDER — CITALOPRAM HYDROBROMIDE 20 MG PO TABS: 20.0000 mg | ORAL_TABLET | Freq: Every day | ORAL | Status: DC

## 2011-01-21 NOTE — Telephone Encounter (Signed)
Pls let pt know that her C spine xray shows muscle spasm in the neck (straightening of normal curvature seen) and minimal disc disease.  If she wants to proceed with PT, I'd be happy to set her up.

## 2011-01-21 NOTE — Patient Instructions (Addendum)
Xray neck today.  Will call you w/ results tomorrow.  Trial of Advil 2-3 tabs up to 3 x a day for pain/ inflammation.  If neck xray is normal, will get an EMG.  Start Citalopram daily for mood.  CUT TABS IN HALF THE FIRST WK.  Call if any problems.  Return for f/u mood in 2 mos.
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
## 2011-01-22 ENCOUNTER — Telehealth: Payer: Self-pay | Admitting: Family Medicine

## 2011-01-22 DIAGNOSIS — M5412 Radiculopathy, cervical region: Secondary | ICD-10-CM

## 2011-01-22 NOTE — Telephone Encounter (Signed)
LMOM informing Pt of the above. Pt to CB if she wants to do PT

## 2011-01-22 NOTE — Telephone Encounter (Signed)
Pt returning Kristina Martinez, CMA call from earlier today.  Pt okay for you to go ahead and set up PT and she can go anytime.  Let her know when it has been set for. Plan:  Routed to Joycelyn Schmid, LPN [facility]

## 2011-01-23 DIAGNOSIS — M5412 Radiculopathy, cervical region: Secondary | ICD-10-CM | POA: Insufficient documentation
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
## 2011-01-28 ENCOUNTER — Telehealth: Payer: Self-pay | Admitting: Family Medicine

## 2011-01-28 ENCOUNTER — Other Ambulatory Visit: Payer: Self-pay | Admitting: Family Medicine

## 2011-01-28 MED ORDER — METHYLPHENIDATE HCL 20 MG PO TABS: 20.0000 mg | ORAL_TABLET | Freq: Two times a day (BID) | ORAL | Status: DC

## 2011-01-28 NOTE — Telephone Encounter (Signed)
Pt calling for the refill of her methylphenadate for her ADD.  Pt has not had a acutual ADD appt since 10-31-07.  Pt says she takes med infrequently and only when she feels she needs it. Plan:  Pt was scheduled an appt for 02-01-11, and told I will give a 30 day supply but no more refills until she satisfies her fup appt. Jarvis Newcomer, LPN [facility]

## 2011-01-28 NOTE — Telephone Encounter (Signed)
Pt notified that Dr. Cathey Endow says she does not fill this medication for her.  Pt.stated Dr. Cathey Endow last filled in Dec 2011.  Her psych doctor in [place] filled 5 years ago, but since she has been in [place] she states Dr. Cathey Endow has filled.   Plan:  Pt has an appt on the books for 02-01-11, and she'll just talk to Dr. Cathey Endow about the script then. Jarvis Newcomer, LPN [facility] D[facility]

## 2011-02-01 ENCOUNTER — Ambulatory Visit (INDEPENDENT_AMBULATORY_CARE_PROVIDER_SITE_OTHER): Payer: BC Managed Care – PPO | Admitting: Family Medicine

## 2011-02-01 ENCOUNTER — Encounter: Payer: Self-pay | Admitting: Family Medicine

## 2011-02-01 ENCOUNTER — Ambulatory Visit: Payer: BC Managed Care – PPO | Admitting: Physical Therapy

## 2011-02-01 DIAGNOSIS — F329 Major depressive disorder, single episode, unspecified: Secondary | ICD-10-CM

## 2011-02-01 DIAGNOSIS — F988 Other specified behavioral and emotional disorders with onset usually occurring in childhood and adolescence: Secondary | ICD-10-CM

## 2011-02-01 DIAGNOSIS — F32A Depression, unspecified: Secondary | ICD-10-CM

## 2011-02-01 NOTE — Assessment & Plan Note (Signed)
Diagnosed in [place] about 8 yrs ago  We had a long talk about black box warnings associated with stimulants and I explained that with her concurrent anxiety issues and hx of HTN and age of 89, she was higher risk and I want her to see psych.  She is agreeable.

## 2011-02-01 NOTE — Progress Notes (Signed)
  Subjective:    Patient ID: Kristina Martinez, female    DOB: [birth year], 40 y.o.   MRN: [id number]  HPI 40 yo obese WF with GAD presents for f/u visit.  She notes a dx of ADD first diagnosed about 8 yrs ago while living in [place]. Saw a psychiatrist at the time and did well on Ritalin which she took sparingly up until about 6 mos ago.    She has had HTN but is off meds right now.  She is on Celexa and Xanax for GAD which are working well.  She wanted to RF her Ritalin with me for ADD but she has multiple risk factors, so I declined it.  She is gretting ready to get back to work and feels like she 'needs' it.  BP 132/81  Pulse 79  Ht 5\' 7"  (1.702 m)  Wt 241 lb (109.317 kg)  BMI 37.75 kg/m2   Review of Systems  Psychiatric/Behavioral: Positive for decreased concentration. Negative for dysphoric mood and agitation. The patient is nervous/anxious.        Objective:   Physical Exam  Constitutional: She appears well-developed and well-nourished. No distress.       obese  Neck: No thyromegaly present.  Cardiovascular: Normal rate, regular rhythm and normal heart sounds.   Pulmonary/Chest: Effort normal and breath sounds normal.  Neurological:       No tremor  Psychiatric: She has a normal mood and affect.          Assessment & Plan:

## 2011-02-01 NOTE — Patient Instructions (Signed)
Referal made for psychiatry.

## 2011-02-03 ENCOUNTER — Ambulatory Visit: Payer: BC Managed Care – PPO | Attending: Family Medicine | Admitting: Physical Therapy

## 2011-02-03 DIAGNOSIS — M6281 Muscle weakness (generalized): Secondary | ICD-10-CM | POA: Insufficient documentation

## 2011-02-03 DIAGNOSIS — R209 Unspecified disturbances of skin sensation: Secondary | ICD-10-CM | POA: Insufficient documentation

## 2011-02-03 DIAGNOSIS — IMO0001 Reserved for inherently not codable concepts without codable children: Secondary | ICD-10-CM | POA: Insufficient documentation

## 2011-02-16 ENCOUNTER — Ambulatory Visit: Payer: BC Managed Care – PPO | Admitting: Physical Therapy

## 2011-02-18 ENCOUNTER — Ambulatory Visit: Payer: BC Managed Care – PPO | Admitting: Physical Therapy

## 2011-02-22 ENCOUNTER — Ambulatory Visit (INDEPENDENT_AMBULATORY_CARE_PROVIDER_SITE_OTHER): Payer: BC Managed Care – PPO | Admitting: Physician Assistant

## 2011-02-22 DIAGNOSIS — F988 Other specified behavioral and emotional disorders with onset usually occurring in childhood and adolescence: Secondary | ICD-10-CM

## 2011-02-23 ENCOUNTER — Ambulatory Visit: Payer: BC Managed Care – PPO | Admitting: Physical Therapy

## 2011-02-25 ENCOUNTER — Encounter: Payer: BC Managed Care – PPO | Admitting: Physical Therapy

## 2011-02-26 ENCOUNTER — Ambulatory Visit (INDEPENDENT_AMBULATORY_CARE_PROVIDER_SITE_OTHER): Payer: BC Managed Care – PPO | Admitting: Family Medicine

## 2011-02-26 VITALS — BP 130/80 | HR 65 | Temp 99.5°F

## 2011-02-26 DIAGNOSIS — N390 Urinary tract infection, site not specified: Secondary | ICD-10-CM

## 2011-02-26 LAB — POCT URINALYSIS DIPSTICK
Ketones, UA: NEGATIVE
Protein, UA: NEGATIVE
Spec Grav, UA: 1.015
pH, UA: 6

## 2011-02-26 MED ORDER — SULFAMETHOXAZOLE-TMP DS 800-160 MG PO TABS: 1.0000 | ORAL_TABLET | Freq: Two times a day (BID) | ORAL | Status: DC

## 2011-02-26 NOTE — Progress Notes (Addendum)
  Subjective:    Patient ID: Kristina Martinez, female    DOB: September 05, 1970, 40 y.o.   MRN: [id number]  HPI C/o frequency and dysuria x 1 week. Denies abd and back pain    Review of Systems     Objective:   Physical Exam        Assessment & Plan:  UA only shows trace blood. Will tx anad send culture to confim.

## 2011-03-02 ENCOUNTER — Ambulatory Visit (HOSPITAL_COMMUNITY): Payer: BC Managed Care – PPO | Admitting: Psychology

## 2011-03-02 ENCOUNTER — Ambulatory Visit (INDEPENDENT_AMBULATORY_CARE_PROVIDER_SITE_OTHER): Payer: BC Managed Care – PPO | Admitting: Psychology

## 2011-03-02 DIAGNOSIS — F988 Other specified behavioral and emotional disorders with onset usually occurring in childhood and adolescence: Secondary | ICD-10-CM

## 2011-03-02 DIAGNOSIS — F4320 Adjustment disorder, unspecified: Secondary | ICD-10-CM

## 2011-03-04 ENCOUNTER — Telehealth: Payer: Self-pay | Admitting: Family Medicine

## 2011-03-04 NOTE — Telephone Encounter (Signed)
Pt called to inquire about her urine culture results.  Not found in the system. Plan:  Notified Darl Pikes @ [facility] downstairs.  Called micro and they have spec, and will result a prelim tomorrow.  Pt informed. Jarvis Newcomer, LPN [facility] Dimes

## 2011-03-05 ENCOUNTER — Telehealth: Payer: Self-pay | Admitting: *Deleted

## 2011-03-05 NOTE — Telephone Encounter (Signed)
Pt called stating she is still having urinary Sxs. She completed ABX and I believe culture was negitive but c/o burning and frequency w/ urination. Please advise.

## 2011-03-06 LAB — URINE CULTURE

## 2011-03-08 ENCOUNTER — Encounter (INDEPENDENT_AMBULATORY_CARE_PROVIDER_SITE_OTHER): Payer: BC Managed Care – PPO | Admitting: Physician Assistant

## 2011-03-08 ENCOUNTER — Telehealth: Payer: Self-pay | Admitting: Family Medicine

## 2011-03-08 DIAGNOSIS — F988 Other specified behavioral and emotional disorders with onset usually occurring in childhood and adolescence: Secondary | ICD-10-CM

## 2011-03-08 DIAGNOSIS — F339 Major depressive disorder, recurrent, unspecified: Secondary | ICD-10-CM

## 2011-03-08 MED ORDER — CEPHALEXIN 500 MG PO CAPS: 500.0000 mg | ORAL_CAPSULE | Freq: Two times a day (BID) | ORAL | Status: AC

## 2011-03-08 NOTE — Telephone Encounter (Signed)
LMOM informing Pt  

## 2011-03-08 NOTE — Telephone Encounter (Signed)
urin3e sampe collected.

## 2011-03-08 NOTE — Telephone Encounter (Signed)
Call patient: Her cultures are positive this time. I will call in an antibiotic which it is definitely sensitive to.

## 2011-03-12 ENCOUNTER — Encounter (HOSPITAL_COMMUNITY): Payer: BC Managed Care – PPO | Admitting: Physician Assistant

## 2011-03-16 ENCOUNTER — Encounter (INDEPENDENT_AMBULATORY_CARE_PROVIDER_SITE_OTHER): Payer: BC Managed Care – PPO | Admitting: Psychology

## 2011-03-16 DIAGNOSIS — F909 Attention-deficit hyperactivity disorder, unspecified type: Secondary | ICD-10-CM

## 2011-03-16 DIAGNOSIS — F411 Generalized anxiety disorder: Secondary | ICD-10-CM

## 2011-03-25 ENCOUNTER — Ambulatory Visit (INDEPENDENT_AMBULATORY_CARE_PROVIDER_SITE_OTHER): Payer: BC Managed Care – PPO | Admitting: Family Medicine

## 2011-03-25 ENCOUNTER — Ambulatory Visit
Admission: RE | Admit: 2011-03-25 | Discharge: 2011-03-25 | Disposition: A | Discharge status: Home / Self Care | Payer: BC Managed Care – PPO | Source: Ambulatory Visit | Attending: Family Medicine | Admitting: Family Medicine

## 2011-03-25 ENCOUNTER — Encounter: Payer: Self-pay | Admitting: Family Medicine

## 2011-03-25 VITALS — BP 137/87 | HR 103 | Temp 98.3°F | Ht 67.0 in | Wt 242.0 lb

## 2011-03-25 DIAGNOSIS — T17900A Unspecified foreign body in respiratory tract, part unspecified causing asphyxiation, initial encounter: Secondary | ICD-10-CM

## 2011-03-25 DIAGNOSIS — T17908A Unspecified foreign body in respiratory tract, part unspecified causing other injury, initial encounter: Secondary | ICD-10-CM

## 2011-03-25 DIAGNOSIS — T17308A Unspecified foreign body in larynx causing other injury, initial encounter: Secondary | ICD-10-CM

## 2011-03-25 DIAGNOSIS — J209 Acute bronchitis, unspecified: Secondary | ICD-10-CM

## 2011-03-25 MED ORDER — ALBUTEROL SULFATE (5 MG/ML) 0.5% IN NEBU: 2.5000 mg | INHALATION_SOLUTION | Freq: Once | RESPIRATORY_TRACT | Status: DC

## 2011-03-25 MED ORDER — ALBUTEROL SULFATE HFA 108 (90 BASE) MCG/ACT IN AERS: 2.0000 | INHALATION_SPRAY | RESPIRATORY_TRACT | Status: DC | PRN

## 2011-03-25 MED ORDER — IPRATROPIUM BROMIDE 0.02 % IN SOLN: 0.5000 mg | Freq: Once | RESPIRATORY_TRACT | Status: DC

## 2011-03-25 MED ORDER — AZITHROMYCIN 250 MG PO TABS: ORAL_TABLET | ORAL | Status: AC

## 2011-03-25 MED ORDER — HYDROCOD POLST-CHLORPHEN POLST 10-8 MG/5ML PO LQCR: 5.0000 mL | Freq: Two times a day (BID) | ORAL | Status: DC | PRN

## 2011-03-25 NOTE — Progress Notes (Signed)
Subjective:    Patient ID: Kristina Martinez, female    DOB: Sep 16, 1970, 40 y.o.   MRN: [id number]  HPI Patient states she may have aspirated on apiece of chicken Wednesday. By Saturday she had trouble breathing and has had coughing w/difficulty sleeping.  BP 137/87  Pulse 103  Temp(Src) 98.3 F (36.8 C) (Oral)  Ht 5\' 7"  (1.702 m)  Wt 242 lb (109.77 kg)  BMI 37.90 kg/m2  SpO2 96% No Known Allergies History   Social History  . Marital Status: Married    Spouse Name: N/A    Number of Children: N/A  . Years of Education: N/A   Occupational History  . Not on file.   Social History Main Topics  . Smoking status: Never Smoker   . Smokeless tobacco: Not on file  . Alcohol Use: Yes  . Drug Use: No  . Sexually Active: Not on file   Other Topics Concern  . Not on file   Social History Narrative  . No narrative on file     Review of Systems  Constitutional: Positive for activity change and fatigue. Negative for fever.  Respiratory: Positive for cough, shortness of breath and wheezing.   Cardiovascular: Negative.        Objective:   Physical Exam  Constitutional: She is oriented to person, place, and time. She appears well-developed and well-nourished.  HENT:  Head: Normocephalic and atraumatic.  Right Ear: External ear normal.  Left Ear: External ear normal.  Eyes: Pupils are equal, round, and reactive to light.  Neck: Normal range of motion. Neck supple. No tracheal deviation present.  Cardiovascular: Normal rate, regular rhythm and normal heart sounds.   Pulmonary/Chest: Effort normal. No respiratory distress. She has no wheezes.  Musculoskeletal: Normal range of motion.  Neurological: She is alert and oriented to person, place, and time. She has normal reflexes.  Skin: Skin is warm.  Psychiatric: She has a normal mood and affect. Her behavior is normal.   Outpatient Encounter Prescriptions as of 03/25/2011  Medication Sig Dispense Refill  . citalopram (CELEXA)  20 MG tablet Take 1 tablet (20 mg total) by mouth daily.  30 tablet  2  . fish oil-omega-3 fatty acids 1000 MG capsule Take 2 g by mouth daily.        Marland Kitchen levonorgestrel-ethinyl estradiol (SEASONALE) 0.15-0.03 MG per tablet Take 1 tablet by mouth daily.        . methylphenidate (RITALIN) 20 MG tablet Take 20 mg by mouth 2 (two) times daily.        . nitroGLYCERIN (NITRODUR - DOSED IN MG/24 HR) 0.4 mg/hr USE 1/4 PATCH DAILY TO RIGHT ACHILLES TENDON  30 patch  0  . omeprazole (PRILOSEC) 40 MG capsule Take 1 capsule (40 mg total) by mouth daily.  30 capsule  3  . albuterol (VENTOLIN HFA) 108 (90 BASE) MCG/ACT inhaler Inhale 2 puffs into the lungs every 4 (four) hours as needed for wheezing.  3.7 g  1  . ALPRAZolam (XANAX) 0.5 MG tablet Take 0.5 mg by mouth daily as needed.        Marland Kitchen azithromycin (ZITHROMAX Z-PAK) 250 MG tablet Take 2 tablets (500 mg) on  Day 1,  followed by 1 tablet (250 mg) once daily on Days 2 through 5.  6 each  0  . chlorpheniramine-HYDROcodone (TUSSIONEX PENNKINETIC ER) 10-8 MG/5ML LQCR Take 5 mLs by mouth every 12 (twelve) hours as needed (may take 1/2 tsp).  115 mL  0  Facility-Administered Encounter Medications as of 03/25/2011  Medication Dose Route Frequency Provider Last Rate Last Dose  . albuterol (PROVENTIL) (5 MG/ML) 0.5% nebulizer solution 2.5 mg  2.5 mg Nebulization Once Hassan Rowan, MD      . ipratropium (ATROVENT) 0.02 % nebulizer solution 0.5 mg  0.5 mg Nebulization Once Hassan Rowan, MD              Assessment & Plan:  Patient will go for a CXR to make sure the chiicken she aspirated on Wednesday did not cause her symptoms on Saturday and that we are not dealing w/a pneumonia.  Offered a dose of solumedrol but she declined. DX bronchitis and possible aspiration As per discussion if wheezing becomes worse or SOB gets worse please come back or to after hour facility of choice for possible steroid administration. Return this fall for yearly exam Zpack as directed    tussionex 1/2 to 1 tsp po twice a day Albuterol inhaler as a prn agent

## 2011-03-25 NOTE — Patient Instructions (Signed)
As per discussion if wheezing becomes worse or SOB gets worse please come back or to after hour facility of choice for possible steroid administration. Return this fall for yearly exam Zpack as directed  tussionex 1/2 to 1 tsp po twice a day Albuterol inhaler as a prn agent

## 2011-03-26 ENCOUNTER — Telehealth: Payer: Self-pay | Admitting: *Deleted

## 2011-03-26 ENCOUNTER — Encounter (HOSPITAL_COMMUNITY): Payer: BC Managed Care – PPO | Admitting: Physician Assistant

## 2011-03-26 NOTE — Telephone Encounter (Signed)
I just reviewed and sent to results box.

## 2011-03-26 NOTE — Telephone Encounter (Signed)
Pt calling to get results of chest Xray

## 2011-04-05 ENCOUNTER — Encounter: Payer: Self-pay | Admitting: Family Medicine

## 2011-04-05 ENCOUNTER — Inpatient Hospital Stay (INDEPENDENT_AMBULATORY_CARE_PROVIDER_SITE_OTHER)
Admission: RE | Admit: 2011-04-05 | Discharge: 2011-04-05 | Disposition: A | Discharge status: Home / Self Care | Payer: BC Managed Care – PPO | Source: Ambulatory Visit | Attending: Family Medicine | Admitting: Family Medicine

## 2011-04-05 DIAGNOSIS — IMO0002 Reserved for concepts with insufficient information to code with codable children: Secondary | ICD-10-CM

## 2011-04-13 ENCOUNTER — Encounter (INDEPENDENT_AMBULATORY_CARE_PROVIDER_SITE_OTHER): Payer: BC Managed Care – PPO | Admitting: Psychology

## 2011-04-13 DIAGNOSIS — F331 Major depressive disorder, recurrent, moderate: Secondary | ICD-10-CM

## 2011-04-20 ENCOUNTER — Encounter (HOSPITAL_COMMUNITY): Payer: BC Managed Care – PPO | Admitting: Psychology

## 2011-04-21 ENCOUNTER — Encounter (HOSPITAL_COMMUNITY): Payer: BC Managed Care – PPO | Admitting: Physician Assistant

## 2011-04-21 DIAGNOSIS — F411 Generalized anxiety disorder: Secondary | ICD-10-CM

## 2011-04-21 DIAGNOSIS — F909 Attention-deficit hyperactivity disorder, unspecified type: Secondary | ICD-10-CM

## 2011-04-23 ENCOUNTER — Ambulatory Visit (INDEPENDENT_AMBULATORY_CARE_PROVIDER_SITE_OTHER): Payer: BC Managed Care – PPO | Admitting: Family Medicine

## 2011-04-23 ENCOUNTER — Encounter: Payer: Self-pay | Admitting: Family Medicine

## 2011-04-23 VITALS — BP 135/84 | HR 83 | Wt 243.0 lb

## 2011-04-23 DIAGNOSIS — R1011 Right upper quadrant pain: Secondary | ICD-10-CM

## 2011-04-23 DIAGNOSIS — Z23 Encounter for immunization: Secondary | ICD-10-CM

## 2011-04-23 DIAGNOSIS — R7309 Other abnormal glucose: Secondary | ICD-10-CM

## 2011-04-23 DIAGNOSIS — D509 Iron deficiency anemia, unspecified: Secondary | ICD-10-CM

## 2011-04-23 LAB — POCT GLYCOSYLATED HEMOGLOBIN (HGB A1C): Hemoglobin A1C: 5.6

## 2011-04-23 NOTE — Patient Instructions (Signed)
We will call you with your lab results 

## 2011-04-23 NOTE — Progress Notes (Signed)
Subjective:    Patient ID: Kristina Martinez, female    DOB: Jun 06, 1971, 40 y.o.   MRN: [id number]  HPI Back Pain on her Right. She went to the ED on 04/20/2011, because thought it might be a UTI. Her urinalysis was negative. It occ radiates to the RUQ.  She has hx of cholecystectomy. Was seen by [facility].  Had a colonoscopy and endoscopy that was normal prior to discovering it was her gallbladder. She had her gallbladder removed about 2 years ago and has done really well until recently. She says that the pain that she is currently having feels just like it did before she had her gallbladder removed. The pain is not worse with movement or rotation. She says she feels maybe a little worse after she eats. No recent change in bowel movements. No blood in the stool. No fever. No cough or cold symptoms. She did bring in her lab work from the emergency department. They were concerned because her glucose was 180. She states it's this was taken about an hour and half after she ate breakfast. Also noted her MCV was 81.  In the emergency room they felt her pain was more musculoskeletal and recommended NSAIDs for treatment. She says this has not helped. Review of Systems     BP 135/84  Pulse 83  Wt 243 lb (110.224 kg)    No Known Allergies  Past Medical History  Diagnosis Date  . Obese   . ADD (attention deficit disorder)   . Depression   . IBS (irritable bowel syndrome)   . Ovarian cyst   . PMDD (premenstrual dysphoric disorder)   . Achilles tendon injury     Past Surgical History  Procedure Date  . Laparoscopic cholecystectomy     History   Social History  . Marital Status: Married    Spouse Name: N/A    Number of Children: N/A  . Years of Education: N/A   Occupational History  . Not on file.   Social History Main Topics  . Smoking status: Never Smoker   . Smokeless tobacco: Not on file  . Alcohol Use: Yes  . Drug Use: No  . Sexually Active: Not on file   Other Topics  Concern  . Not on file   Social History Narrative  . No narrative on file    Family History  Problem Relation Age of Onset  . Cancer Mother     skin  . Cancer Father     prostate  . Heart disease Father     AMI  . Stroke Brother 6    [name] had no medications administered during this visit.  Objective:   Physical Exam  Constitutional: She is oriented to person, place, and time. She appears well-developed and well-nourished.  HENT:  Head: Normocephalic and atraumatic.  Cardiovascular: Normal rate, regular rhythm and normal heart sounds.   Pulmonary/Chest: Effort normal and breath sounds normal.  Abdominal: Soft. Bowel sounds are normal. She exhibits no distension and no mass. There is tenderness. There is no rebound and no guarding.       She is tender on the right mid abdomen and in the right upper quadrant. She is nontender over the thoracic spine and paraspinous muscles. She has normal range of motion of her back.  Neurological: She is alert and oriented to person, place, and time.  Skin: Skin is warm and dry.  Psychiatric: She has a normal mood and affect. Her behavior is normal.  Assessment & Plan:  Right upper quadrant pain-I suspect that this still could be related to the bile ducts. She has had her gallbladder removed. At this point, recommend further evaluation with GI. I have had patients have complications even after gallbladder removal of stones in the bile duct. I recommend a bland diet at this point in time. She is already on PPI. She also had normal liver enzymes on the Lapra-Ty the emergency room.  Right upper back pain-I think this is related to the right upper quadrant pain. This does not appear to be musculoskeletal to me. Certainly if she wants she can try Aleve or ibuprofen.  Abnormal glucose-she was not fasting today so we did an A1c. It was completely normal. I gave her reassurance. Lab Results  Component Value Date   HGBA1C 5.6  04/23/2011     MCV-I will check her for her iron deficiency.

## 2011-04-24 LAB — IRON: Iron: 61 ug/dL (ref 42–145)

## 2011-04-24 LAB — HEMOGLOBIN: Hemoglobin: 12.3 g/dL (ref 12.0–15.0)

## 2011-04-26 ENCOUNTER — Encounter (HOSPITAL_COMMUNITY): Payer: BC Managed Care – PPO | Admitting: Psychology

## 2011-04-27 ENCOUNTER — Telehealth: Payer: Self-pay | Admitting: *Deleted

## 2011-04-27 NOTE — Telephone Encounter (Signed)
Message copied by Wyline Beady on Tue Apr 27, 2011 10:49 AM ------      Message from: Nani Gasser D      Created: Mon Apr 26, 2011  8:50 PM       Hemoglobin is stable. Iron is still in the normal range but down a little from last time. Recommend eat iron rich foods and can take a woemen MVI that has extra iron. REcheck in 1 year.

## 2011-04-27 NOTE — Telephone Encounter (Signed)
Pt.notified

## 2011-04-30 ENCOUNTER — Other Ambulatory Visit: Payer: Self-pay | Admitting: *Deleted

## 2011-04-30 DIAGNOSIS — K219 Gastro-esophageal reflux disease without esophagitis: Secondary | ICD-10-CM

## 2011-04-30 MED ORDER — OMEPRAZOLE 40 MG PO CPDR: 40.0000 mg | DELAYED_RELEASE_CAPSULE | Freq: Every day | ORAL | Status: DC

## 2011-05-03 ENCOUNTER — Encounter (HOSPITAL_COMMUNITY): Payer: BC Managed Care – PPO | Admitting: Psychology

## 2011-05-12 ENCOUNTER — Ambulatory Visit (HOSPITAL_COMMUNITY): Payer: BC Managed Care – PPO | Admitting: Physician Assistant

## 2011-05-18 ENCOUNTER — Encounter (HOSPITAL_COMMUNITY): Payer: BC Managed Care – PPO | Admitting: Psychology

## 2011-05-19 ENCOUNTER — Other Ambulatory Visit (HOSPITAL_COMMUNITY): Payer: Self-pay

## 2011-05-24 ENCOUNTER — Ambulatory Visit (INDEPENDENT_AMBULATORY_CARE_PROVIDER_SITE_OTHER): Payer: BC Managed Care – PPO | Admitting: Physician Assistant

## 2011-05-24 ENCOUNTER — Encounter (HOSPITAL_COMMUNITY): Payer: Self-pay | Admitting: Physician Assistant

## 2011-05-24 VITALS — BP 130/88 | HR 95 | Ht 67.0 in | Wt 244.2 lb

## 2011-05-24 DIAGNOSIS — I10 Essential (primary) hypertension: Secondary | ICD-10-CM

## 2011-05-24 DIAGNOSIS — F411 Generalized anxiety disorder: Secondary | ICD-10-CM

## 2011-05-24 DIAGNOSIS — F909 Attention-deficit hyperactivity disorder, unspecified type: Secondary | ICD-10-CM | POA: Insufficient documentation

## 2011-05-24 MED ORDER — METHYLPHENIDATE HCL 20 MG PO TABS: 20.0000 mg | ORAL_TABLET | Freq: Every day | ORAL | Status: DC

## 2011-05-24 NOTE — Progress Notes (Signed)
   [facility] Follow-up Outpatient Visit  Kristina Martinez 1970-07-12  Date: 05/24/2011  Subjective: Medication management of ADHD Pt. States the slow release form of the methylphenidate does not agree with her and she would like to return to the IR dose. She is also continuing on her Citalopram without difficulty.  There were no vitals filed for this visit.  Mental Status Examination  Appearance: Neat and clean Alert: Yes Attention: good  Cooperative: Yes Eye Contact: Good Speech: clear and goal directed Psychomotor Activity: Normal Memory/Concentration: Better on IR formulation Oriented: person, place, time/date, situation and day of week Mood: Euthymic Affect: Congruent Thought Processes and Associations: Linear Fund of Knowledge: Good Insight: good Judgement: Good  Diagnosis: ADHD                      GAD  Treatment Plan: Continue with citalopram as noted.                    Reorder the methylphenidate IR formulation.                    Follow up 8-10 weeks.  Korbyn Chopin, PA

## 2011-05-25 ENCOUNTER — Ambulatory Visit (HOSPITAL_COMMUNITY): Payer: BC Managed Care – PPO | Admitting: Psychology

## 2011-05-26 ENCOUNTER — Other Ambulatory Visit: Payer: Self-pay | Admitting: *Deleted

## 2011-05-26 MED ORDER — LEVONORGEST-ETH ESTRAD 91-DAY 0.15-0.03 MG PO TABS: 1.0000 | ORAL_TABLET | Freq: Every day | ORAL | Status: DC

## 2011-05-28 ENCOUNTER — Ambulatory Visit (HOSPITAL_COMMUNITY): Payer: BC Managed Care – PPO | Admitting: Psychology

## 2011-05-28 NOTE — Op Note (Signed)
NAMEMAEVEN, MCDOUGALL NO.:  [id number]  MEDICAL RECORD NO.:  [id number]  LOCATION:                                 FACILITY:  PHYSICIAN:  Conley Simmonds, D.D.S.DATE OF BIRTH:  [birth year]  DATE OF PROCEDURE:  05/26/2011 DATE OF DISCHARGE:                              OPERATIVE REPORT   SURGEON:  Conley Simmonds, DDS  ASSISTANTS:  Lorelle Formosa and Joni Reining.  TYPE OF OPERATION:  Restorative dentistry.  This was performed as an outpatient on [address].  DESCRIPTION OF PROCEDURE:  The patient was brought to the operating room.  Anesthesia was begun using nasotracheal intubation.  The eyes were tapped, shut, and padded with ointment through the entire procedure.  Any x-rays involved the use of lead apron covering the child's neck and torso.  A throat pack was then placed through the entire procedure and rubber dam was used when practical.  Child received a complete oral examination and prophylaxis and a full mouth series of dental x-rays were taken.  They were visualized in the operating room and also one postoperative x-ray was taken to confirm the success of the root canal treatment.  Following teeth were dealt within the following manner.  Tooth D, E, F, and G had crowns placed, cemented with Ketac cement, also received complete endodontic treatment using  zinc oxide eugenol to fill the canal.  The crowns were meddled with Acrylic facings.  Tooth D received a stainless steel crown, cemented with Ketac cement, and had Dycal base.  Tooth I received a stainless steel crown with pulpotomy.  The pulpotomy was sealed with MTA material and the crown cemented with Ketac cement.  Tooth L an occlusal composite restoration with Dycal base.  Tooth S, a stainless steel crown with Dycal base cemented with Ketac cement.  Teeth A, J, K, and T received Delton Sealants.  At the end of the procedure, the oropharyngeal area was thoroughly evacuated also before, but this evacuation a  fluoride treatment was performed using fluoride varnish.  After thorough examination and evacuation of the throat pack area and oropharyngeal area, the throat pack was removed and the child was taken to the recovery room with minimal blood loss from the procedure.  A prescription for amoxicillin 250 mg/5 mL, dispensed 150 mL 2 teaspoons to start, then 1 teaspoon every 8 hours was prescribed for a 5-day duration to cover any post-endodontic infection.  The justification for the use of general anesthesia with this child's very young age and inability to cooperate with adult treatment in a routine dental office setting.     Conley Simmonds, D.D.S.     EMM/MEDQ  D:  05/26/2011  T:  05/27/2011  Job:  [id number]

## 2011-05-31 ENCOUNTER — Encounter: Payer: Self-pay | Admitting: *Deleted

## 2011-05-31 ENCOUNTER — Encounter (HOSPITAL_COMMUNITY): Payer: Self-pay | Admitting: Psychology

## 2011-05-31 ENCOUNTER — Ambulatory Visit (INDEPENDENT_AMBULATORY_CARE_PROVIDER_SITE_OTHER): Payer: BC Managed Care – PPO | Admitting: Psychology

## 2011-05-31 ENCOUNTER — Emergency Department
Admit: 2011-05-31 | Discharge: 2011-05-31 | Disposition: A | Discharge status: Home / Self Care | Payer: BC Managed Care – PPO

## 2011-05-31 ENCOUNTER — Emergency Department
Admission: EM | Admit: 2011-05-31 | Discharge: 2011-05-31 | Disposition: A | Discharge status: Home / Self Care | Payer: BC Managed Care – PPO | Source: Home / Self Care | Attending: Family Medicine | Admitting: Family Medicine

## 2011-05-31 DIAGNOSIS — S93409A Sprain of unspecified ligament of unspecified ankle, initial encounter: Secondary | ICD-10-CM

## 2011-05-31 DIAGNOSIS — F331 Major depressive disorder, recurrent, moderate: Secondary | ICD-10-CM

## 2011-05-31 DIAGNOSIS — S93402A Sprain of unspecified ligament of left ankle, initial encounter: Secondary | ICD-10-CM

## 2011-05-31 DIAGNOSIS — F988 Other specified behavioral and emotional disorders with onset usually occurring in childhood and adolescence: Secondary | ICD-10-CM

## 2011-05-31 NOTE — Progress Notes (Signed)
Summary: LT RIB PAIN AND WAIST...WSE Room 2   Vital Signs:  Patient Profile:   40 Years Old Female CC:      Left flank and rib pain after lifting a heavy rug on Sat, Pain started Sun morning LMP:     03/15/2011 Height:     67 inches (168.28 cm) Weight:      243 pounds O2 Sat:      97 % O2 treatment:    Room Air Temp:     98.4 degrees F oral Pulse rate:   107 / minute Pulse rhythm:   irregular Resp:     16 per minute BP sitting:   156 / 101  (left arm) Cuff size:   regular  Vitals Entered By: Emilio Math (April 05, 2011 1:53 PM)  Menstrual History: LMP (date): 03/15/2011                  Current Allergies: No known allergies History of Present Illness Chief Complaint: Left flank and rib pain after lifting a heavy rug on Sat, Pain started Sun morning History of Present Illness:  Subjective:  Patient complains of awakening yesterday with pain in her left abdomen that radiates to left groin.  The pain is worse with movement.  Flexing her legs causes pain.  No GU symptoms.  No change in bowel movement.  No fevers, chills, and sweats. No chest pain.  No rash.  Two days ago she picked up and carried a heavy, awkward rolled up carpet.  The pain has not been improved with ibuprofen 800mg  three times a day  REVIEW OF SYSTEMS Constitutional Symptoms      Denies fever, chills, night sweats, weight loss, weight gain, and fatigue.  Eyes       Denies change in vision, eye pain, eye discharge, glasses, contact lenses, and eye surgery. Ear/Nose/Throat/Mouth       Denies hearing loss/aids, change in hearing, ear pain, ear discharge, dizziness, frequent runny nose, frequent nose bleeds, sinus problems, sore throat, hoarseness, and tooth pain or bleeding.  Respiratory       Denies dry cough, productive cough, wheezing, shortness of breath, asthma, bronchitis, and emphysema/COPD.  Cardiovascular       Denies murmurs, chest pain, and tires easily with exhertion.    Gastrointestinal       Denies stomach pain, nausea/vomiting, diarrhea, constipation, blood in bowel movements, and indigestion. Genitourniary       Denies painful urination, kidney stones, and loss of urinary control. Neurological       Denies paralysis, seizures, and fainting/blackouts. Musculoskeletal       Complains of muscle pain.      Denies joint pain, joint stiffness, decreased range of motion, redness, swelling, muscle weakness, and gout.  Skin       Denies bruising, unusual mles/lumps or sores, and hair/skin or nail changes.  Psych       Denies mood changes, temper/anger issues, anxiety/stress, speech problems, depression, and sleep problems.  Past History:  Past Medical History: Reviewed history from 09/16/2009 and no changes required. G0 obese ADD- was on Prozac, Ritalin, Citalopram Depression IBS, neg test for Celiac Sprue ?PMDD ovarian cyst -- ruptured 11-2008 achilles injury R leg -- Dr Roanna Epley  Past Surgical History: Reviewed history from 06/15/2010 and no changes  required. lap chole 2011  Family History: Reviewed history from 04/07/2009 and no changes required. mother skin cancer father prostate cancer at 46, AMI at 4 brother CVA at 71 neg fpor bone problems  Social History: Reviewed history from 10/31/2007 and no changes required. 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 Married to [place].  No kids. Never smoked. 1 ETOH/ wk. Occas ETOH. 80# wt gain in 1 yr. Poor diet.  No exercise.   Objective:  No acute distress  Eyes:  Pupils are equal, round, and reactive to light and accomodation.  Extraocular movement is intact.  Conjunctivae are not inflamed.  Mouth:  moist mucous membranes  Neck:  Supple.  No adenopathy is present.   Heart:  Regular rate and rhythm without murmurs, rubs, or gallops.  Abdomen:  There is tenderness over the left abdominal muscles without masses or hepatosplenomegaly.  Bowel sounds are present.  No CVA or flank tenderness.  Pain is exacerbated by flexion of abdominal muscles.  No hernias palpated.     Skin:  No rash  Assessment New Problems: OTHER SPECIFIED SITES OF SPRAINS AND STRAINS (ICD-848.8)  ABDOMINAL  MUSCLE STRAIN.  NO EVIDENCE HERNIA              Plan New Medications/Changes: PERCOCET 5-325 MG TABS (OXYCODONE-ACETAMINOPHEN) 1 by mouth hs as needed pain  #7 (seven) x 0, 04/05/2011, Donna Christen MD  New Orders: Est. Patient Level III [phone] Planning Comments:   Apply ice pack several times daily.  Analgesic at bedtime.  In about 3 to 5 days begin range of motion and stretching exercises ([facility] information and instruction patient handout given)  Avoid lifting. Return for worsening symptoms.                                                                                                                                                                                                                                                                                                                                The patient and/or caregiver has been counseled thoroughly with regard to medications prescribed including dosage, schedule, interactions, rationale for use, and possible side effects and they verbalize understanding.  Diagnoses and expected course of recovery discussed and will return if not improved as expected or if the condition worsens. Patient and/or caregiver verbalized understanding.  Prescriptions: PERCOCET 5-325 MG TABS (OXYCODONE-ACETAMINOPHEN) 1 by mouth hs as needed pain  #7 (seven) x 0   Entered and Authorized by:   Donna Christen MD   Signed by:   Donna Christen MD on 04/05/2011   Method used:   Print then Give to Patient   RxID:   [id number]   Orders Added: 1)  Est. Patient Level III [95621]

## 2011-05-31 NOTE — Progress Notes (Signed)
   THERAPIST PROGRESS NOTE  Session Time: 6962-9528 am  Participation Level: Active  Behavioral Response: Well GroomedAlertEuthymic  Type of Therapy: Individual Therapy  Treatment Goals addressed: Anger and Coping  Interventions: CBT, Solution Focused, Strength-based and Supportive  Summary: Kristina Martinez is a 40 y.o. female who presents with her severely autistic brother in law Billey Gosling who is visiting for the month of December.  The patient reports she is doing well and thinks things are going better for her.  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  She has a plan to ramp up in January and is excited to get her business going.  She spent Thanksgiving in [place] with her mother and friends.  She talked about some of the anger she has toward her mother for her manipulative behaviors.  I asked some questions about her mother's growing up years and she shared some details that helps to explain her behaviors.  The patient was able to see how her early years have impacted her and admits being annoyed but acknowledging that she has no insight into her behaviors and never will.  I provided some suggestions about how the patient can read between the lines of what her mother is attempting to communicate and she thinks this is helpful.  She cried throughout the session time when talking about her mother and her father.  Her father remarried a woman a lot like her mother and he has taken to increasing his alcohol use and she admits she thinks he now has a problem.  Her father was never a drinker and it appears as his stress grows his coping has become feeble.  I talked with her about how to cope with her issues and how to evaluate her own behaviors so that she can continue to remain emotionally healthy.  The patient thought she was going to say that she doesn't need an  additional sessions but now is requesting to return.  Suicidal/Homicidal: No  Plan: Return again in 2 weeks.  Diagnosis: Axis I: ADHD, inattentive type, Major depressive disorder, recurrent, moderate    Axis II: No diagnosis    Salley Scarlet, [facility] 05/31/2011

## 2011-05-31 NOTE — ED Notes (Signed)
Patient reports left ankle pain while jogging across a parking lot 3 weeks ago. She has constant pain and swelling since. Used ice and ibuprofen.

## 2011-05-31 NOTE — ED Provider Notes (Signed)
History     CSN: [id number] Arrival date & time: 05/31/2011 11:58 AM   First MD Initiated Contact with Patient 05/31/11 1213      Chief Complaint  Patient presents with  . Ankle Pain    left      HPI Comments: While jogging in a parking lot 3 to 4 weeks ago she stepped into a hole and twisted her left ankle. She has persistent soreness swelling on the lateral aspect, usually worse when at rest, and is now having pain dorsally over ankle.  Patient is a 40 y.o. female presenting with ankle pain.  Ankle Pain    Past Medical History  Diagnosis Date  . Obese   . ADD (attention deficit disorder)   . Depression   . IBS (irritable bowel syndrome)   . Ovarian cyst   . PMDD (premenstrual dysphoric disorder)   . Achilles tendon injury     Past Surgical History  Procedure Date  . Laparoscopic cholecystectomy     Family History  Problem Relation Age of Onset  . Cancer Mother     skin  . Cancer Father     prostate  . Heart disease Father     AMI  . Stroke Brother 40    History  Substance Use Topics  . Smoking status: Never Smoker   . Smokeless tobacco: Never Used  . Alcohol Use: Yes     1/ in 6 months    OB History    Grav Para Term Preterm Abortions TAB SAB Ect Mult Living                  Review of Systems  Musculoskeletal:       Complains of pain in left ankle laterally  All other systems reviewed and are negative.    Allergies  Review of patient's allergies indicates no known allergies.  Home Medications   Current Outpatient Rx  Name Route Sig Dispense Refill  . CITALOPRAM HYDROBROMIDE 20 MG PO TABS Oral Take 1 tablet (20 mg total) by mouth daily. 30 tablet 2  . OMEGA-3 FATTY ACIDS 1000 MG PO CAPS Oral Take 2 g by mouth daily.      Marland Kitchen LEVONORGEST-ETH ESTRAD 91-DAY 0.15-0.03 MG PO TABS Oral Take 1 tablet by mouth daily. 1 Package 3  . METHYLPHENIDATE HCL 20 MG PO TABS Oral Take 1 tablet (20 mg total) by mouth daily. 30 tablet 0  . OMEPRAZOLE 40 MG  PO CPDR Oral Take 1 capsule (40 mg total) by mouth daily. 30 capsule 3    BP 129/83  Pulse 80  Temp(Src) 98.3 F (36.8 C) (Oral)  Resp 14  Ht 5\' 7"  (1.702 m)  Wt 244 lb (110.678 kg)  BMI 38.22 kg/m2  SpO2 99%  Physical Exam  Nursing note and vitals reviewed. Constitutional: She appears well-developed and well-nourished. No distress.  Musculoskeletal:       Left ankle: She exhibits swelling. She exhibits normal range of motion, no ecchymosis, no deformity, no laceration and normal pulse. tenderness. Lateral malleolus tenderness found. No medial malleolus, no head of 5th metatarsal and no proximal fibula tenderness found. Achilles tendon normal.       Feet:       Patient also has tenderness over dorsal extensor tendon; palpation with resisted dorsiflexion causes pain.  Distal Neurovascular function is intact.     ED Course  Procedures none  Labs Reviewed - No data to display Dg Ankle Complete Left  05/31/2011  *RADIOLOGY  REPORT*  Clinical Data: Injury with persistent lateral pain.  LEFT ANKLE COMPLETE - 3+ VIEW  Comparison: 03/11/2008  Findings: No joint effusion.  No evidence of fracture or dislocation.  No focal lesion.  IMPRESSION: Negative radiographs  Original Report Authenticated By: Thomasenia Sales, M.D.     1. Left ankle sprain       MDM   AirCast splint applied.  Apply ice pack for 30 to 45 minutes several times daily.  Continue until swelling decreases.  Wear AirCast splint for 2 to 4 weeks daytime. Begin Ibuprofen 200mg , 4 tabs every 8 hours with food for several days. Begin range of motion and stretching exercises as per instruction sheets.      Donna Christen, MD 05/31/11 1311

## 2011-06-14 ENCOUNTER — Ambulatory Visit (INDEPENDENT_AMBULATORY_CARE_PROVIDER_SITE_OTHER): Payer: BC Managed Care – PPO | Admitting: Psychology

## 2011-06-14 DIAGNOSIS — F909 Attention-deficit hyperactivity disorder, unspecified type: Secondary | ICD-10-CM

## 2011-06-14 DIAGNOSIS — F331 Major depressive disorder, recurrent, moderate: Secondary | ICD-10-CM

## 2011-06-14 NOTE — Patient Instructions (Signed)
1- Practice setting limits and responding in the way you really want not the way you feel obligated to respond. 2- Evaluate your thoughts and when you notice that you are thinking destructive thoughts stop them and insert a positive one. 3- Read the Felipa Eth Real book at your own pace.

## 2011-06-15 NOTE — Progress Notes (Signed)
   THERAPIST PROGRESS NOTE  Session Time: 300- 350 pm  Participation Level: Active  Behavioral Response: Well GroomedAlertEuthymic  Type of Therapy: Individual Therapy  Treatment Goals addressed: Communication: with husband and Coping  Interventions: CBT, Solution Focused, Strength-based and Supportive  Summary: Kristina Martinez is a 40 y.o. female who presents with her autistic brother in law [place]; he is non-communicative and sits quietly in the room. The patient admits that she is feeling tired and realizing that she is more effected this year by Charlie's presence because it is like having an infant full time who is full grown and who can walk.  She admits to needing a break but that her husband is of minimal support despite the fact that it is his brother.  We talked about opportunities for her to be refreshed much like new parents do with a newborn.  Overall she feels like she has made some positive changes since engaging therapy but still struggles with some relationship issues with her husband.  The patient is experiencing some anger toward her husband due to his unwillingness to changes and him engaging her like she was a child.  We talked about ways to learn healthy boundaries and to have a conversation with her husband about the frustration she feels.  The patient thinks her husband also has some autistic tendencies though nothing like his brother but thinks it does get in the way of their relationship.  Suicidal/Homicidal: No  Plan: Return again in 2 weeks.  Diagnosis: Axis I: ADHD, inattentive type, Major depressive disorder, recurrent, moderate    Axis II: No diagnosis    Salley Scarlet, [facility] 06/15/2011

## 2011-07-12 ENCOUNTER — Ambulatory Visit (INDEPENDENT_AMBULATORY_CARE_PROVIDER_SITE_OTHER): Payer: BC Managed Care – PPO | Admitting: Psychology

## 2011-07-12 DIAGNOSIS — F909 Attention-deficit hyperactivity disorder, unspecified type: Secondary | ICD-10-CM

## 2011-07-12 DIAGNOSIS — F331 Major depressive disorder, recurrent, moderate: Secondary | ICD-10-CM

## 2011-07-12 NOTE — Patient Instructions (Signed)
1-Use your logic when trying to do problem solving;balance out your feelings with your logic. 2-Consider contacting local colleges about career testing. 3-Write a list of things you know you'd don't want to do and things you might be interested in. 4-Be transparent. 5-Continue using healthy boundaries.

## 2011-07-14 ENCOUNTER — Encounter (HOSPITAL_COMMUNITY): Payer: Self-pay | Admitting: Psychology

## 2011-07-14 DIAGNOSIS — F331 Major depressive disorder, recurrent, moderate: Secondary | ICD-10-CM | POA: Insufficient documentation

## 2011-07-14 NOTE — Progress Notes (Signed)
   THERAPIST PROGRESS NOTE  Session Time: 300-350 pm  Participation Level: Active  Behavioral Response: Well GroomedAlertEuthymic  Type of Therapy: Individual Therapy  Treatment Goals addressed: Communication: thoughts and feelings openly and honestly and Coping  Interventions: Solution Focused, Strength-based, Psychosocial Skills: communication and Supportive  Summary: Kristina Martinez is a 41 y.o. female who presents alone for her appointment; her brother in law Billey Gosling that has autism is back in his care home in [place]. The patient reports an okay christmas but a nice brief trip to [place] to return her brother in Social worker.  She did not get in touch with her mother did not find out.  She reports her mother had the idea that she was going to come for a visit and after some discussion she agreed for her to come.  The patient's mother called an canceled her trip after several conversations of how she knows the patient doesn't want her to come.  The patient stopped tryig to coax her mother and simply told her that was her decision.  Her mother attempted to rope her into going to the beach for a few days when she was here and when the patient didn't respond the way her mother wished she started talking about she has no one and that she just needs to be around her family.  She reels resentful toward her mother for her personality issues and having had to live with it her whole life.  I reminded her that as an adult she had a choice about that and that her brother opted to not see his mother or have contact.  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  I suggested she consider writing down the careers she knows she is not interested to try and narrow the field.  I also  suggested she contact a local college to see if any career testing can be completed.  Suicidal/Homicidal: No  Plan: Return again in 3 weeks.  Diagnosis: Axis I: Anxiety Disorder NOS, Major depressive disorder, recurrent, moderate    Axis II: No diagnosis    Salley Scarlet, [facility] 07/14/2011

## 2011-08-03 ENCOUNTER — Ambulatory Visit (INDEPENDENT_AMBULATORY_CARE_PROVIDER_SITE_OTHER): Payer: BC Managed Care – PPO | Admitting: Psychology

## 2011-08-03 ENCOUNTER — Encounter (HOSPITAL_COMMUNITY): Payer: Self-pay | Admitting: Psychology

## 2011-08-03 DIAGNOSIS — F331 Major depressive disorder, recurrent, moderate: Secondary | ICD-10-CM

## 2011-08-03 DIAGNOSIS — F411 Generalized anxiety disorder: Secondary | ICD-10-CM

## 2011-08-03 NOTE — Patient Instructions (Signed)
1-Consult Dr. Linford Arnold regarding resuming medication. 2-Talk with your husband about your concerns for his emotional health. 3-Think about the atmosphere you need to create to be motivated and excited to engage work. 4-Continue career exploration and opportunities.

## 2011-08-05 ENCOUNTER — Ambulatory Visit (HOSPITAL_COMMUNITY): Payer: BC Managed Care – PPO | Admitting: Physician Assistant

## 2011-08-06 NOTE — Progress Notes (Signed)
   THERAPIST PROGRESS NOTE  Session Time: 305- 400 pm  Participation Level: Active  Behavioral Response: CasualAlertIrritable  Type of Therapy: Individual Therapy  Treatment Goals addressed: Anger, Communication: thoughts and feelings and Coping  Interventions: CBT, Solution Focused, Strength-based, Psychosocial Skills: coping and Supportive  Summary: Kristina Martinez is a 41 y.o. female who presents on time for her appointment; she admits she is only here because she didn't want to have a no show and a $50 late cancel fee.   The patient states she isn't having a good day and identifies herself as irritated; she is not as neatly put together as usual and acknowledges same.  The patient has been feeling less motivated and depressed.  She thinks that her husband is doing poorly and this upsets her because he isn't someone that will accept help very easily.  She thinks that the winter months are having something to do with her mood and admits that she is uncertain about her career path.  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  She originally thought that being on the first floor where there was a lot of bright light would be helpful but this has not been the case.  We talked about how the environment can affect our mood and the importance of liking your office space.  She is distracted by her cats because she has two litter pans in her office and she cannot close the door.  She also feels stuck in that she can't leave her work out because her husband is not comfortable with her having a messy office when someone comes in their home.  I suggested she evaluate if this is the best setting and how she could make her space more work-friendly.  She thinks it would be helpful to redesign  her space or move to a different space; this is something she plans to consider.  She has made contact with her mother since our last session and reports this is their first contact in about two months.  Her mother was very passive in the conversation and the patient felt angry with her.  She thinks that infrequent contact is better for her sense her mother will never change and she does do better with less contact.  Suicidal/Homicidal: No  Plan: Return again in 2 weeks.  Diagnosis: Axis I: Major depressive disorder, anxiety disorder    Axis II: No diagnosis    Salley Scarlet, [facility] 08/06/2011

## 2011-08-17 ENCOUNTER — Ambulatory Visit (INDEPENDENT_AMBULATORY_CARE_PROVIDER_SITE_OTHER): Payer: BC Managed Care – PPO | Admitting: Psychology

## 2011-08-17 DIAGNOSIS — F331 Major depressive disorder, recurrent, moderate: Secondary | ICD-10-CM

## 2011-08-17 DIAGNOSIS — F411 Generalized anxiety disorder: Secondary | ICD-10-CM

## 2011-08-17 DIAGNOSIS — F909 Attention-deficit hyperactivity disorder, unspecified type: Secondary | ICD-10-CM

## 2011-08-17 NOTE — Patient Instructions (Signed)
1-Make small achievable daily goals. 2-Notice when you are being critical of yourself.  Rescript new ideas. 3-Take time to rest, play, relax, and have fun.

## 2011-08-20 ENCOUNTER — Encounter (HOSPITAL_COMMUNITY): Payer: Self-pay | Admitting: Psychology

## 2011-08-20 NOTE — Progress Notes (Signed)
   THERAPIST PROGRESS NOTE  Session Time: 1000- 1050 am  Participation Level: Active  Behavioral Response: NeatAlertEuthymic  Type of Therapy: Individual Therapy  Treatment Goals addressed: Coping and Diagnosis: major depressive disorder  Interventions: Solution Focused, Strength-based, Psychosocial Skills: organizational, how to improve motivation and Supportive  Summary: Kristina Martinez is a 41 y.o. female who presents as pleasant and easily engaged.  She is in a much better place today than last visit when she was irritable and really didn't want to be in session.  She had trouble identifying positive steps she has made but upon further discussion was able to do so.  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  She struggles with organization and is trying to create some structure but tends to be an all or nothing person.  We discussed the need for her to make small achievable daily goals so that she can celebrate her achievements.  She is also quick to criticize herself and decide what other people must be thinking about her.  I challenged her thinking and she admits that much of it is internal dialogue.  I suggested she notice when she is being critical of yourself and start to develop some more productive thoughts about herself.  She tends to 'feel bad' when she is not doing something but has been stuck and this has been a primary feeling for months.  She notices when she gets out of a routine that she becomes more negative and unmotivated.  I suggested she work on balancing out a schedule that provides the opportunity to take time to work, rest, play, relax, and have fun.  Suicidal/Homicidal: No  Plan: Return again in 2 weeks.  Diagnosis: Axis I: ADHD, inattentive type and Anxiety Disorder NOS, major  depressive disorder    Axis II: No diagnosis    Salley Scarlet, [facility] 08/20/2011

## 2011-08-25 ENCOUNTER — Other Ambulatory Visit: Payer: Self-pay | Admitting: *Deleted

## 2011-08-25 MED ORDER — CITALOPRAM HYDROBROMIDE 10 MG PO TABS: 10.0000 mg | ORAL_TABLET | Freq: Every day | ORAL | Status: DC

## 2011-09-07 ENCOUNTER — Ambulatory Visit (INDEPENDENT_AMBULATORY_CARE_PROVIDER_SITE_OTHER): Payer: BC Managed Care – PPO | Admitting: Psychology

## 2011-09-07 DIAGNOSIS — F331 Major depressive disorder, recurrent, moderate: Secondary | ICD-10-CM

## 2011-09-07 DIAGNOSIS — F909 Attention-deficit hyperactivity disorder, unspecified type: Secondary | ICD-10-CM

## 2011-09-08 ENCOUNTER — Encounter (HOSPITAL_COMMUNITY): Payer: Self-pay | Admitting: Psychology

## 2011-09-08 NOTE — Progress Notes (Signed)
   THERAPIST PROGRESS NOTE  Session Time: 300- 350 pm  Participation Level: Active  Behavioral Response: NeatAlertEuthymic  Type of Therapy: Individual Therapy  Treatment Goals addressed: Communication: with husband and Coping  Interventions: Solution Focused, Strength-based, Psychosocial Skills: communicating in relationships, relaxation and Supportive  Summary: Kristina Martinez is a 41 y.o. female who presents as pleasant and nicely groomed.  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  The patient knows that oftentimes things she is excited about wears off and she then gets in a rut again.  She continues to talk about being uncertain what she want to do 'when she grows up'.  She is trying to explore but doesn't know what she wants.  She works out of desire and not out of need but isn't sure how to invest her time and energy.  [facility] and she dropped out of the program.  She thinks she will do better on her own and admits feeling more resistant because she is expected to attend.  I talked with the patient about responding to things based on what she wants and not based on what people expect of her.  She admits that she tends to try and be a people pleaser but that this creates resentment for her.  She notices that she and her husband tend to communicate better and are able to relax when they are away form home.  Last week they were in [place] [facility] and had a great time despite the cold temperatures.  I suggested the patient needed to consider how to create the 'feel' of the vacation environment in [place] as a way to stay connected to her husband; she admits she thinks this would be helpful.  She also talked about her ability to communicate with her spouse and she admits that she doesn't really tell him what she needs.  I suggested that by her doing this he can be  more successful in his interactions with her and she can learn to relax and not feel so uptight about interacting with him (his OCD gets in the way because she always feels criticized).  Suicidal/Homicidal: No  Plan: Return again in 3 weeks.  Diagnosis: Axis I: ADHD, combined type; Major depressive disorder, recurrent    Axis II: No diagnosis    Salley Scarlet, [facility] 09/08/2011

## 2011-09-22 ENCOUNTER — Other Ambulatory Visit: Payer: Self-pay | Admitting: *Deleted

## 2011-09-22 DIAGNOSIS — K219 Gastro-esophageal reflux disease without esophagitis: Secondary | ICD-10-CM

## 2011-09-22 MED ORDER — OMEPRAZOLE 40 MG PO CPDR: 40.0000 mg | DELAYED_RELEASE_CAPSULE | Freq: Every day | ORAL | Status: DC

## 2011-09-22 MED ORDER — CITALOPRAM HYDROBROMIDE 10 MG PO TABS: 10.0000 mg | ORAL_TABLET | Freq: Every day | ORAL | Status: DC

## 2011-09-22 MED ORDER — LEVONORGEST-ETH ESTRAD 91-DAY 0.15-0.03 MG PO TABS: 1.0000 | ORAL_TABLET | Freq: Every day | ORAL | Status: DC

## 2011-09-22 NOTE — Telephone Encounter (Signed)
Error sent rx  For citalopram and omeprazole for 90 days to [facility] Ms. MAin [facility]. Was suppose to go to [facility]. Canceled rx at [facility] and resent rx

## 2011-09-28 ENCOUNTER — Ambulatory Visit (HOSPITAL_COMMUNITY): Payer: Self-pay | Admitting: Psychology

## 2011-10-07 ENCOUNTER — Other Ambulatory Visit: Payer: Self-pay | Admitting: *Deleted

## 2011-10-07 ENCOUNTER — Ambulatory Visit (INDEPENDENT_AMBULATORY_CARE_PROVIDER_SITE_OTHER): Payer: BC Managed Care – PPO | Admitting: Family Medicine

## 2011-10-07 ENCOUNTER — Ambulatory Visit (INDEPENDENT_AMBULATORY_CARE_PROVIDER_SITE_OTHER): Payer: BC Managed Care – PPO | Admitting: Psychology

## 2011-10-07 ENCOUNTER — Encounter: Payer: Self-pay | Admitting: Family Medicine

## 2011-10-07 ENCOUNTER — Encounter (HOSPITAL_COMMUNITY): Payer: Self-pay | Admitting: Psychology

## 2011-10-07 VITALS — BP 129/83 | HR 72 | Ht 67.0 in | Wt 247.0 lb

## 2011-10-07 DIAGNOSIS — R1012 Left upper quadrant pain: Secondary | ICD-10-CM

## 2011-10-07 DIAGNOSIS — K219 Gastro-esophageal reflux disease without esophagitis: Secondary | ICD-10-CM

## 2011-10-07 DIAGNOSIS — J3489 Other specified disorders of nose and nasal sinuses: Secondary | ICD-10-CM

## 2011-10-07 DIAGNOSIS — F32A Depression, unspecified: Secondary | ICD-10-CM

## 2011-10-07 DIAGNOSIS — Z13220 Encounter for screening for lipoid disorders: Secondary | ICD-10-CM

## 2011-10-07 DIAGNOSIS — F909 Attention-deficit hyperactivity disorder, unspecified type: Secondary | ICD-10-CM

## 2011-10-07 DIAGNOSIS — F329 Major depressive disorder, single episode, unspecified: Secondary | ICD-10-CM

## 2011-10-07 DIAGNOSIS — R0981 Nasal congestion: Secondary | ICD-10-CM

## 2011-10-07 DIAGNOSIS — F331 Major depressive disorder, recurrent, moderate: Secondary | ICD-10-CM

## 2011-10-07 MED ORDER — FLUTICASONE PROPIONATE 50 MCG/ACT NA SUSP: 2.0000 | Freq: Every day | NASAL | Status: DC

## 2011-10-07 MED ORDER — METHYLPHENIDATE HCL 20 MG PO TABS: 20.0000 mg | ORAL_TABLET | Freq: Every day | ORAL | Status: DC

## 2011-10-07 MED ORDER — OMEPRAZOLE 40 MG PO CPDR: 40.0000 mg | DELAYED_RELEASE_CAPSULE | Freq: Every day | ORAL | Status: DC

## 2011-10-07 MED ORDER — LEVONORGEST-ETH ESTRAD 91-DAY 0.15-0.03 MG PO TABS: 1.0000 | ORAL_TABLET | Freq: Every day | ORAL | Status: DC

## 2011-10-07 NOTE — Progress Notes (Signed)
Subjective:    Patient ID: Kristina Martinez, female    DOB: [birth year], 41 y.o.   MRN: [id number]  HPI OCPs - Needs refills.  Depression - On citalopram. She feels that is it working well. She would like to wean it.  She thinks may cause occ drops in BP.    Hiatial hernia -= Still taking her PPI daily. Occ nausea. No vomiting.    She also complains of severe nasal congestion. She does have some allergy history. She has used a nasal steroid in the hospital it wasn't that helpful. She also has a history of chronic sinusitis. She will occasionally get some pain in her front of her head.    She also has some skin tags in her right axilla she would like me to look at. She has multiple edematous and tense. She squeezes something other than a year.  She also has occasional discomfort in the left upper quadrant. She does have a history of chronic constipation and wonders if she could to back up. She has not had a bowel movement in a couple of days bc was at a trade show.. She occasionally feels nauseated but says this is not new. She has not run a fever. She also has history of hiatal hernia. No blood int eh stool   Review of Systems No CP or SOB.      Objective:   Physical Exam  Constitutional: She is oriented to person, place, and time. She appears well-developed and well-nourished.  HENT:  Head: Normocephalic and atraumatic.  Cardiovascular: Normal rate, regular rhythm and normal heart sounds.   Pulmonary/Chest: Effort normal and breath sounds normal.  Abdominal: Soft. Bowel sounds are normal. She exhibits no distension and no mass. There is tenderness. There is no rebound and no guarding.       Tender in the LUQ.    Neurological: She is alert and oriented to person, place, and time.  Skin: Skin is warm and dry.       Multiple skin tags under the right axilla.   Psychiatric: She has a normal mood and affect. Her behavior is normal.          Assessment & Plan:  Contraceptiv  counseling. Needs to scheudle paps smear for the summer but I will refill her medications for 6 months.  Depression - doing well. Will write out a wean for her. Call if has any problme.    Hiatal hernia - we discussed the negative effects of taking long-term PPIs. I did encourage her to wean to every other day or every third day to help control her symptoms and or possibly switching to an H2 blocker such as ranitidine 50 mg twice a day. Patient says she was to return to wean to every other day. We did discuss that she may initially have some rebound symptoms about the first week but that should improve.  Nasal congestion-we will start with a nasal steroid spray. She can call if it's not helping after about 2-3 weeks. We explained that these can take up to 5 days to start working well.  Left upper quadrant pain-likely related to her constipation will have an amylase and lipase her blood work.  Skin tags I then gave reassurance. We can certainly decrease the morning and if she would like some time.  ADD hypertension evidently had an initial appointment downstairs and have her help and I prescribed a medication for her. She did follow up once and I gave her one  refill but she never followed up. Her last appointment was in November. I discussed with her that she needs to get on a steady regimen and when she does that I will be happy to take over prescribing her ADD medications.

## 2011-10-07 NOTE — Patient Instructions (Signed)
Decrease the citalopram to half a tab daily for 2 weeks, then half a tablet every other day for 2 weeks, then stop.

## 2011-10-07 NOTE — Progress Notes (Signed)
   THERAPIST PROGRESS NOTE  Session Time: 204- 240 pm  Participation Level: Active  Behavioral Response: NeatAlertEuthymic  Type of Therapy: Individual Therapy  Treatment Goals addressed: Coping  Interventions: Solution Focused, Strength-based, Psychosocial Skills: coping with stress and Supportive  Summary: Kristina Martinez is a 41 y.o. female who presents as pleasant and easily engaged.  She is doing well other than still being tired from [facility] Week.  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 Her husband left today to go out of town and she is happy to have some 'me time'.  She reports that her mother has been calling her and asking for an invite but she doesn't extend one because she really doesn't want her to visit.  The patient feels stress when she thinks about her mother visiting since it puts her on edge and her mother is critical of her.  I suggested she consider how she could provide her mother with the boundaries prior to her visit so she would know what she could/could not do and the patient could then try and relax.  She doesn't think that her mother would handle that too well.  She has not experienced any issues with her anxiety. She reports she thinks she has done well in therapy and doesn't need to attend anymore.   Suicidal/Homicidal: No  Plan: Call for an appointment as needed.   I talked with her about how to request an appointment as needed in the future and she is willing to do so.   Diagnosis: Axis I: ADHD, inattentive type; GAD    Axis II: No diagnosis    Salley Scarlet, [facility] 10/07/2011

## 2011-10-22 ENCOUNTER — Ambulatory Visit (INDEPENDENT_AMBULATORY_CARE_PROVIDER_SITE_OTHER): Payer: BC Managed Care – PPO | Admitting: Family Medicine

## 2011-10-22 ENCOUNTER — Other Ambulatory Visit (HOSPITAL_COMMUNITY)
Admission: RE | Admit: 2011-10-22 | Discharge: 2011-10-22 | Disposition: A | Discharge status: Home / Self Care | Payer: BC Managed Care – PPO | Source: Ambulatory Visit | Attending: Family Medicine | Admitting: Family Medicine

## 2011-10-22 ENCOUNTER — Encounter: Payer: Self-pay | Admitting: Family Medicine

## 2011-10-22 VITALS — BP 126/82 | HR 98 | Temp 98.1°F | Ht 67.0 in | Wt 246.0 lb

## 2011-10-22 DIAGNOSIS — R300 Dysuria: Secondary | ICD-10-CM

## 2011-10-22 DIAGNOSIS — Z01419 Encounter for gynecological examination (general) (routine) without abnormal findings: Secondary | ICD-10-CM | POA: Insufficient documentation

## 2011-10-22 DIAGNOSIS — Z1159 Encounter for screening for other viral diseases: Secondary | ICD-10-CM | POA: Insufficient documentation

## 2011-10-22 DIAGNOSIS — N949 Unspecified condition associated with female genital organs and menstrual cycle: Secondary | ICD-10-CM

## 2011-10-22 DIAGNOSIS — R102 Pelvic and perineal pain: Secondary | ICD-10-CM

## 2011-10-22 DIAGNOSIS — Z124 Encounter for screening for malignant neoplasm of cervix: Secondary | ICD-10-CM

## 2011-10-22 LAB — POCT URINALYSIS DIPSTICK
Bilirubin, UA: NEGATIVE
Glucose, UA: NEGATIVE
Ketones, UA: NEGATIVE
pH, UA: 5.5

## 2011-10-22 NOTE — Progress Notes (Signed)
Subjective:    Patient ID: Kristina Martinez, female    DOB: 11/25/1970, 41 y.o.   MRN: [id number]  HPI  Noticed when would urinate would feel a pressure on her vagina for about 2 weeks. Yesterday dysuria and frequency. No hematuria.  Says actually feels better.  Noticed some brown discharge.  Not on her period.  No fever. Mild low back pain as well. No vaginal discharge. No hx of urinary stones. Says almost felt like might have been a kidney stone that was passing. Dribbled a little around 3 in AM and then all the sudden peed a lot.   Review of Systems  BP 126/82  Pulse 98  Temp(Src) 98.1 F (36.7 C) (Oral)  Ht 5\' 7"  (1.702 m)  Wt 246 lb (111.585 kg)  BMI 38.53 kg/m2    No Known Allergies  Past Medical History  Diagnosis Date  . Obese   . ADD (attention deficit disorder)   . Depression   . IBS (irritable bowel syndrome)   . Ovarian cyst   . PMDD (premenstrual dysphoric disorder)   . Achilles tendon injury     Past Surgical History  Procedure Date  . Laparoscopic cholecystectomy 2011    History   Social History  . Marital Status: Married    Spouse Name: N/A    Number of Children: N/A  . Years of Education: N/A   Occupational History  . Not on file.   Social History Main Topics  . Smoking status: Never Smoker   . Smokeless tobacco: Never Used  . Alcohol Use: Yes     1/ in 6 months  . Drug Use: No  . Sexually Active: Not on file   Other Topics Concern  . Not on file   Social History Narrative  . No narrative on file    Family History  Problem Relation Age of Onset  . Skin cancer Mother     skin  . Prostate cancer Father   . Heart disease Father     AMI  . Stroke Brother 40  . Breast cancer Maternal Grandmother   . Breast cancer Paternal Grandmother   . Uterine cancer Maternal Grandmother     ? cervix    Outpatient Encounter Prescriptions as of 10/22/2011  Medication Sig Dispense Refill  . fish oil-omega-3 fatty acids 1000 MG capsule Take 2 g by  mouth daily.        . fluticasone (FLONASE) 50 MCG/ACT nasal spray Place 2 sprays into the nose daily.  32 g  2  . levonorgestrel-ethinyl estradiol (SEASONALE,INTROVALE,JOLESSA) 0.15-0.03 MG tablet Take 1 tablet by mouth daily.  3 Package  1  . methylphenidate (RITALIN) 20 MG tablet Take 1 tablet (20 mg total) by mouth daily.  30 tablet  0  . Multiple Vitamin (MULTIVITAMIN) tablet Take 1 tablet by mouth daily.      Marland Kitchen omeprazole (PRILOSEC) 40 MG capsule Take 1 capsule (40 mg total) by mouth daily.  90 capsule  1   Facility-Administered Encounter Medications as of 10/22/2011  Medication Dose Route Frequency Provider Last Rate Last Dose  . ipratropium (ATROVENT) 0.02 % nebulizer solution 0.5 mg  0.5 mg Nebulization Once Hassan Rowan, MD              Objective:   Physical Exam  Constitutional: She appears well-developed and well-nourished.  HENT:  Head: Normocephalic and atraumatic.  Genitourinary: Vagina normal and uterus normal. There is no rash, tenderness or lesion on the right labia. There  is no rash, tenderness or lesion on the left labia. Cervix exhibits friability. Cervix exhibits no motion tenderness and no discharge. Right adnexum displays no mass, no tenderness and no fullness. Left adnexum displays no mass, no tenderness and no fullness. No erythema or bleeding around the vagina. No vaginal discharge found.       Mild uterus prolapse.           Assessment & Plan:  Pelvic pain-unclear etiology. Certainly this could have been a urinary stone she does feel much better today. A urinalysis is negative but we will send a culture. We should have the results back early next week and we can make sure that she does not have a urinary tract infection it may be complicating her symptoms. She does have some mild uterine prolapse but this should not be causing any significant symptoms. She does have some chronic stress incontinence but she is not wanting to do anything further than [place]  exercises at this point in time. Call if she has any further concerns, starts to develop a fever. Or symptoms recur. We also performed a Pap smear today. We should have results back in about a week. Her last Pap smear was normal. If this was normal then repeat in 2-3 years.

## 2011-10-25 ENCOUNTER — Other Ambulatory Visit: Payer: Self-pay | Admitting: Family Medicine

## 2011-10-25 LAB — URINE CULTURE: Colony Count: >=100000

## 2011-10-25 MED ORDER — CIPROFLOXACIN HCL 500 MG PO TABS: 500.0000 mg | ORAL_TABLET | Freq: Two times a day (BID) | ORAL | Status: AC

## 2011-11-02 ENCOUNTER — Telehealth: Payer: Self-pay | Admitting: *Deleted

## 2011-11-02 NOTE — Telephone Encounter (Signed)
Pt states that she is still having burning and frequency. Please advise.

## 2011-11-02 NOTE — Telephone Encounter (Signed)
Come in for repeat sample adn culture. She had e coli before and it was sensitve to cipro.

## 2011-11-03 NOTE — Telephone Encounter (Signed)
LMOM

## 2012-01-13 ENCOUNTER — Encounter (HOSPITAL_COMMUNITY): Payer: Self-pay | Admitting: Psychology

## 2012-01-13 ENCOUNTER — Ambulatory Visit (INDEPENDENT_AMBULATORY_CARE_PROVIDER_SITE_OTHER): Payer: BC Managed Care – PPO | Admitting: Psychology

## 2012-01-13 DIAGNOSIS — F331 Major depressive disorder, recurrent, moderate: Secondary | ICD-10-CM

## 2012-01-13 DIAGNOSIS — F909 Attention-deficit hyperactivity disorder, unspecified type: Secondary | ICD-10-CM

## 2012-01-13 NOTE — Patient Instructions (Signed)
1- Figure out the job stuff  - make a decision about continuing with [employer]  -consult girlfriend for validation of discontinuance with [employer]             -discuss with Wynona Canes concerns related to current business venture 2- Take care of myself (FIRST).  -try to make healthy food choices  -increase physical activity 3- Want to have a better relationship with Lorin Picket  -increase conversation with him  -block negative dialogue about him in your head 4-Consider Marital Therapy with Harriett Buerckholtz; her number is [phone].

## 2012-01-19 NOTE — Progress Notes (Signed)
   THERAPIST PROGRESS NOTE  Session Time: 1100- 1150 am  Participation Level: Active  Behavioral Response: CasualAlertEuthymic  Type of Therapy: Individual Therapy  Treatment Goals addressed: Anxiety, Communication: thoughts and feelings, with family and Coping  Interventions: Solution Focused, Strength-based, Psychosocial Skills: setting limits on herself, self care, confronting husband and Supportive  Summary: Kristina Martinez is a 41 y.o. female who presents as pleasant but somewhat dull compared to her normal presentation.  She admits that she has been dealing with significant stressors including father's illness, a visit from her developmentally disabled brother in law, professional intervention for brother regarding his alcohol problem, business difficulties, and marital issues.  The patient feels overwhelmed with the amount of stressors she has been managing.  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  She had to deal with the reality that her brother is an alcoholic and his refusal to accept assistance for his problem.  She is close with her father and seeing him dealing with health issues has caused her to think more about the possibility of him dying.  The patient talked at length about the above stressors.  She tried to be funny; this is her usual defense against facing her problems.  She was able to try and describe her feelings including frustration, helplessness, sadness, and overwhelmed.  She reports she has resolved her brother's alcoholism in her mind and that she has tried to help him and he did not accept and that she is done.  I suspect that if he were to ask her for help she would respond to him favorably.  Suicidal/Homicidal: No  Plan: Return again in 2 weeks.  Diagnosis: Axis I: Major depressive disorder, GAD    Axis II: No diagnosis    Salley Scarlet, [facility] 01/19/2012

## 2012-01-24 ENCOUNTER — Encounter (HOSPITAL_COMMUNITY): Payer: Self-pay | Admitting: Psychology

## 2012-01-24 ENCOUNTER — Ambulatory Visit (INDEPENDENT_AMBULATORY_CARE_PROVIDER_SITE_OTHER): Payer: BC Managed Care – PPO | Admitting: Psychology

## 2012-01-24 DIAGNOSIS — F909 Attention-deficit hyperactivity disorder, unspecified type: Secondary | ICD-10-CM

## 2012-01-24 DIAGNOSIS — F331 Major depressive disorder, recurrent, moderate: Secondary | ICD-10-CM

## 2012-01-24 LAB — LIPID PANEL
Cholesterol: 227 mg/dL — ABNORMAL HIGH (ref 0–200)
LDL Cholesterol: 147 mg/dL — ABNORMAL HIGH (ref 0–99)
Triglycerides: 189 mg/dL — ABNORMAL HIGH (ref <150)

## 2012-01-24 LAB — COMPLETE METABOLIC PANEL WITH GFR
ALT: 11 U/L (ref 0–35)
CO2: 20 mEq/L (ref 19–32)
Calcium: 9.1 mg/dL (ref 8.4–10.5)
Chloride: 106 mEq/L (ref 96–112)
Creat: 0.72 mg/dL (ref 0.50–1.10)
GFR, Est African American: >89 mL/min
Glucose, Bld: 100 mg/dL — ABNORMAL HIGH (ref 70–99)

## 2012-01-24 LAB — AMYLASE: Amylase: 42 U/L (ref 0–105)

## 2012-01-24 NOTE — Progress Notes (Signed)
   THERAPIST PROGRESS NOTE  Session Time: 804- 920 am  Participation Level: Active  Behavioral Response: CasualAlertEuthymic  Type of Therapy: Individual Therapy  Treatment Goals addressed: Communication: in relationships and Coping  Interventions: Solution Focused, Strength-based, Psychosocial Skills: marital, coping, problem solving and Supportive  Summary: Kristina Martinez is a 41 y.o. female who presents as pleasant and easily engaged.  She is doing better today than our last visit despite continued stressors.  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  She has been dealing with her sadness related to her friend's disclosure that she has cancer, though she thinks she is managing her emotions.  There has been an increase in marital tension with the discovery that her husband made personal contact with his masseur to discuss his unhappiness in his marriage.  The patient was admittedly hurt and confronted her husband.  She was able to express her feelings to her husband including her disappointment that he did not speak with her about his concerns.  The patient feels uncertain about their future but believes they both are willing to work on their issues and her spouse has agreed to seek marital counseling with her.  She feels stuck in her life and discontent in her career.  I provided her with an online resource to assist her in doing some career exploration and evaluation.  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  I provided the patient with a problem solving format to utilize when evaluating next steps as a way to get her thinking on paper and not in her mind where she much is occurring.  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  Her brother and sister in law are relocating to [place].  She plans to stay out of dealing with her brother's alcoholism and plans to let her sister in law deal with this.  Suicidal/Homicidal: No  Plan: Return again in 1-2 week.  Diagnosis: Axis I: Major depressive disorder, recurrent; GAD    Axis II: No diagnosis    Salley Scarlet, [facility] 01/24/2012

## 2012-02-01 ENCOUNTER — Ambulatory Visit (HOSPITAL_COMMUNITY): Payer: Self-pay | Admitting: Psychology

## 2012-02-01 ENCOUNTER — Encounter: Payer: Self-pay | Admitting: *Deleted

## 2012-02-01 ENCOUNTER — Emergency Department (INDEPENDENT_AMBULATORY_CARE_PROVIDER_SITE_OTHER)
Admission: EM | Admit: 2012-02-01 | Discharge: 2012-02-01 | Disposition: A | Discharge status: Home / Self Care | Payer: BC Managed Care – PPO | Source: Home / Self Care

## 2012-02-01 DIAGNOSIS — J029 Acute pharyngitis, unspecified: Secondary | ICD-10-CM

## 2012-02-01 LAB — POCT RAPID STREP A (OFFICE): Rapid Strep A Screen: NEGATIVE

## 2012-02-01 MED ORDER — AMOXICILLIN 875 MG PO TABS: ORAL_TABLET | ORAL | Status: DC

## 2012-02-01 NOTE — ED Provider Notes (Signed)
History     CSN: [id number]  Arrival date & time 02/01/12  1410   First MD Initiated Contact with Patient 02/01/12 1412      Chief Complaint  Patient presents with  . Sore Throat   HPI SORE THROAT  Onset: 3-4 days  Description: sore throat, post nasal drip, mild cough  Modifying factors: baseline hx/o allergies   Symptoms  Fever:  no URI symptoms: mild Cough: mild Headache: no Rash:  no Swollen glands:   yes Recent Strep Exposure: no LUQ pain: no Heartburn/brash: no Allergy Symptoms: yes  Red Flags STD exposure: no Breathing difficulty: no Drooling: no Trismus: no    Past Medical History  Diagnosis Date  . Obese   . ADD (attention deficit disorder)   . Depression   . IBS (irritable bowel syndrome)   . Ovarian cyst   . PMDD (premenstrual dysphoric disorder)   . Achilles tendon injury     Past Surgical History  Procedure Date  . Laparoscopic cholecystectomy 2011    Family History  Problem Relation Age of Onset  . Skin cancer Mother     skin  . Prostate cancer Father   . Heart disease Father     AMI  . Heart failure Father   . Stroke Brother 40  . Breast cancer Maternal Grandmother   . Uterine cancer Maternal Grandmother     ? cervix  . Breast cancer Paternal Grandmother     History  Substance Use Topics  . Smoking status: Never Smoker   . Smokeless tobacco: Never Used  . Alcohol Use: Yes     1/ in 6 months    OB History    Grav Para Term Preterm Abortions TAB SAB Ect Mult Living                  Review of Systems  All other systems reviewed and are negative.    Allergies  Review of patient's allergies indicates no known allergies.  Home Medications   Current Outpatient Rx  Name Route Sig Dispense Refill  . OMEGA-3 FATTY ACIDS 1000 MG PO CAPS Oral Take 2 g by mouth daily.      Marland Kitchen FLUTICASONE PROPIONATE 50 MCG/ACT NA SUSP Nasal Place 2 sprays into the nose daily. 32 g 2  . LEVONORGEST-ETH ESTRAD 91-DAY 0.15-0.03 MG PO TABS  Oral Take 1 tablet by mouth daily. 3 Package 1  . METHYLPHENIDATE HCL 20 MG PO TABS Oral Take 1 tablet (20 mg total) by mouth daily. 30 tablet 0  . ONE-DAILY MULTI VITAMINS PO TABS Oral Take 1 tablet by mouth daily.    Marland Kitchen OMEPRAZOLE 40 MG PO CPDR Oral Take 1 capsule (40 mg total) by mouth daily. 90 capsule 1    BP 131/82  Pulse 95  Temp 98.6 F (37 C) (Oral)  Resp 18  Ht 5\' 7"  (1.702 m)  Wt 248 lb 12 oz (112.832 kg)  BMI 38.96 kg/m2  SpO2 98%  LMP 10/27/2011  Physical Exam  Constitutional: She appears well-developed and well-nourished.  HENT:  Head: Normocephalic and atraumatic.  Right Ear: External ear normal.  Mouth/Throat: No oropharyngeal exudate.       +nasal erythema, rhinorrhea bilaterally, + post oropharyngeal erythema   Mild L TM erythema     Eyes: Conjunctivae are normal. Pupils are equal, round, and reactive to light.  Neck: Normal range of motion. Neck supple.  Cardiovascular: Normal rate and regular rhythm.   Pulmonary/Chest: Effort normal and breath sounds  normal.  Abdominal: Soft.  Musculoskeletal: Normal range of motion.  Neurological: She is alert.  Skin: Skin is warm.    ED Course  Procedures (including critical care time)   Labs Reviewed  POCT RAPID STREP A (OFFICE)   No results found.   1. Pharyngitis       MDM  Likely viral pharyngitis with allergic overlap.  Discussed symptomatic management and supportive care.  Given mild L ear TM swelling, prophylactic amox rx given if sxs fail to improve in 3-4 days.  Handout given.  Infectious red flags reviewed.     The patient and/or caregiver has been counseled thoroughly with regard to treatment plan and/or medications prescribed including dosage, schedule, interactions, rationale for use, and possible side effects and they verbalize understanding. Diagnoses and expected course of recovery discussed and will return if not improved as expected or if the condition worsens. Patient and/or  caregiver verbalized understanding.             Floydene Flock, MD 02/01/12 1525

## 2012-02-01 NOTE — ED Notes (Signed)
Pt c/o sore throat, irritation to the roof of her mouth, and swollen lymph nodes x 4 days. Denies fever. She has taken IBF.

## 2012-02-01 NOTE — ED Provider Notes (Signed)
AGree with care plan.  

## 2012-02-08 ENCOUNTER — Ambulatory Visit (INDEPENDENT_AMBULATORY_CARE_PROVIDER_SITE_OTHER): Payer: BC Managed Care – PPO | Admitting: Psychology

## 2012-02-08 DIAGNOSIS — F909 Attention-deficit hyperactivity disorder, unspecified type: Secondary | ICD-10-CM

## 2012-02-08 DIAGNOSIS — F331 Major depressive disorder, recurrent, moderate: Secondary | ICD-10-CM

## 2012-02-08 NOTE — Progress Notes (Signed)
   THERAPIST PROGRESS NOTE  Session Time: 305- 412 pm  Participation Level: Active  Behavioral Response: CasualAlertEuthymic  Type of Therapy: Individual Therapy  Treatment Goals addressed: Anxiety, Communication: in relationships and Coping  Interventions: Solution Focused, Strength-based, Psychosocial Skills: coping, communication, decision making and Supportive  Summary: Kristina Martinez is a 41 y.o. female who presents as pleasant and easily engaged.  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  This is as close to her dream job that she can get at this time (her dream job being in Engineer, technical sales).  The patient isn't sure why she is struggling so much and questions if depression is what is causing her to feel stunted.  We reviewed the symptoms of depression and she admits she is depressed.  Her husband is having significant depressive and anxiety issues at this time that she believes is a contributing factor.  The patient has thought recently about leaving but doesn't have a full time job and doesn't think she can made it  She loves her husband and wants to work on their marriage. She has the name and phone number of a family therapist but has not made the call and when discussing she became tearful.  I explored her tearfulness and she reports she fears that in doing marital therapy it will not work and that their marriage will end. She suggested to him that the stress on him of their large home is causing increased issues and that they need to sell, however he is unwilling to do so since they will not recoup the entire amount paid.  The patient thinks that if they lose $50,000 but they repair their marriage and have less stress due to the upkeep that it would be wiser.  Suicidal/Homicidal: No  Plan: Return again  in 1 week.  Diagnosis: Axis I: Major depressive disorder, recurrent; ADHD    Axis II: No diagnosis    Salley Scarlet, [facility] 02/08/2012

## 2012-02-08 NOTE — Patient Instructions (Addendum)
**  When setting goals set a not satisfied, a satisfied, and a beyond expectations. **LOGICAL vs. Emotional 1- Determine how many stores in each of the major markets you would like to make contact. 2- Research stores and contact people. 3- Set a drop date to initiate contact. 4- Review current client list and target those customers in [place], [place], [place] and [place]. 5- Organize plan for markets with due dates one week prior to opening. 6- Select appropriate clothing to wear at each event. 7- Set financial and contact goals for each market. 8- List of materials to transport to each market and send to hotel prior to arrival. 9- Take spiral notebooks and create one for weekly, quarterly and yearly goals.

## 2012-02-09 ENCOUNTER — Encounter (HOSPITAL_COMMUNITY): Payer: Self-pay | Admitting: Psychology

## 2012-02-15 ENCOUNTER — Ambulatory Visit (HOSPITAL_COMMUNITY): Payer: Self-pay | Admitting: Psychology

## 2012-02-22 ENCOUNTER — Ambulatory Visit (INDEPENDENT_AMBULATORY_CARE_PROVIDER_SITE_OTHER): Payer: BC Managed Care – PPO | Admitting: Psychology

## 2012-02-22 DIAGNOSIS — F909 Attention-deficit hyperactivity disorder, unspecified type: Secondary | ICD-10-CM

## 2012-02-22 DIAGNOSIS — F331 Major depressive disorder, recurrent, moderate: Secondary | ICD-10-CM

## 2012-02-22 NOTE — Patient Instructions (Addendum)
1- Take your emotional self first. 2- Decide how you want to manage your mood based on you and not based on what you think Kristina Martinez needs. 3- Invite Kristina Martinez  to a session. 4- Consider sharing your feelings with Kristina Martinez in an intellectual format. 5- Ask Kristina Martinez to share his vulnerabilities with you.  If Kristina Martinez gives an example that appears unusual to you, ask him to consider putting away the feelings he has             about it and thinking about it purely from a logical viewpoint. 6- Before speaking, consider how/what you want to say to Kristina Martinez so that he has a better chance of hearing you.

## 2012-02-24 ENCOUNTER — Encounter (HOSPITAL_COMMUNITY): Payer: Self-pay | Admitting: Psychology

## 2012-02-24 NOTE — Progress Notes (Signed)
   THERAPIST PROGRESS NOTE  Session Time: 306- 400 pm  Participation Level: Active  Behavioral Response: CasualAlertSad  Type of Therapy: Individual Therapy  Treatment Goals addressed: Anxiety, Communication: with husband, how to be transparent and communicate concerns and Coping  Interventions: Solution Focused, Strength-based, Psychosocial Skills: coping, communication and Supportive  Summary: Kristina Martinez is a 41 y.o. female who presents on time for her appointment appearing casually groomed.  Her lack of attentiveness to her appearance generally means something has brought her down.  She reports work is going well and she is starting to write down a schedule and she feels good about this.  Her marriage has been troubling her.  She confronted her spouse on his ongoing dialogue with his masseuse.  Her husband was upset and told her he would not make any more contact and instead of stopping at that he disabled his cell phone.  The patient thinks his behavior is extreme however doesn't know how to talk to him so that he can hear her.  She admits that communication has been an issue their entire married life and it is not easier now.  Her spouse is easily offended and very emotional yet will not talk about what he is feeling.  He remains very intellectually focused.  I talked about her being emotional and he doesn't relate well to this and she agrees.  I suggested that she consider how she could approach him in a way that he could 'hear' her.  She suggested that he might respond to a 'feelings outline' where she can express what she is feeling in a simple format.  The patient doesn't want their marriage to deteriorate any further and wants to be in the marriage.  She attempted to secure a marital therapy appointment with my referral Harriett Buerckholtz, however she is moving to [place].  [name] provided the name of another provider and the patient plans to make contact for an  appointment.   We talked about the importance of self care and the patient is going to make an effort to do this.  In addition, she is going to decide how she wants to manage her mood based on what she thinks she needs and not based on what you think Scott needs.  We discussed inviting Scott to a session and her talking the time to decide what she wishes to convey to [place] before speaking.  Suicidal/Homicidal: No  Plan: Return again in 1 weeks.  Diagnosis: Axis I: Major depressive disorder, recurrent; GAD    Axis II: No diagnosis    Salley Scarlet, [facility] 02/24/2012

## 2012-03-09 ENCOUNTER — Ambulatory Visit (INDEPENDENT_AMBULATORY_CARE_PROVIDER_SITE_OTHER): Payer: BC Managed Care – PPO | Admitting: Psychology

## 2012-03-09 DIAGNOSIS — F411 Generalized anxiety disorder: Secondary | ICD-10-CM

## 2012-03-09 DIAGNOSIS — F909 Attention-deficit hyperactivity disorder, unspecified type: Secondary | ICD-10-CM

## 2012-03-09 DIAGNOSIS — F331 Major depressive disorder, recurrent, moderate: Secondary | ICD-10-CM

## 2012-03-09 DIAGNOSIS — F419 Anxiety disorder, unspecified: Secondary | ICD-10-CM

## 2012-03-10 ENCOUNTER — Encounter (HOSPITAL_COMMUNITY): Payer: Self-pay | Admitting: Psychology

## 2012-03-10 DIAGNOSIS — F419 Anxiety disorder, unspecified: Secondary | ICD-10-CM | POA: Insufficient documentation

## 2012-03-10 NOTE — Progress Notes (Signed)
THERAPIST PROGRESS NOTE  Session Time: 310- 400 pm  Participation Level: Active  Behavioral Response: CasualAlertEuthymic  Type of Therapy: Individual Therapy  Treatment Goals addressed: Communication: in relationships and Coping  Interventions: Solution Focused, Strength-based, Psychosocial Skills: communicating thoughts and feelings, coping, planning and Supportive  Summary: ANIKE Martinez is a 41 y.o. female who presents as pleasant and easily engaged.  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  The goal set by the owner was 10 new companies on board and she had 12 companies agree to work with them.  The owner was not as happy but the patient feels satisfied that she exceeded the goal.  She completed the entire display herself and even when the owner came and started moving things around she ultimately agreed that the patient had the best display and put everything back in place.  She approached the owner about selling a line of jewelery since the patient cannot make any money just selling from the [employer]; the owner turned her request down and she is frustrated about this.  She has known the owner for 18 years and has always had a good relationship but has heard from other friends that she can be very vengeful and she doesn't want this to occur.  The patient brainstormed how to best approach her to discuss the financial picture.  I suggested she needed to determine with her spouse what was a reasonable income and how long were they willing to hold on for her to achieve the income.  The patient has hopes that she will one day take over the business since the owner is 39 and her daughters have no interest in the bridal business.  I suggested that making decisions regarding her approach would be exceedingly important.  The patient and her husband have engaged marriage counseling with Ophelia Shoulder  (referred from [facility]).  She thinks that it is going well but most of the issues have concentrated on her husband.  He has told her that he thinks that the counselor is on the patient's side because they are both female and because I referred them to this counselor.  The patient corrected her husband and told him I did not refer to this counselor; the counselor I initially referred to is relocating and she referred the couple to their current counselor.  She reports that they had a big fit today about his ongoing contact with his masseuse.  She reports that he is the person who initiates contact and that the masseuse has simply responded to his texts.  She has read his texts and there is nothing inappropriate about the way the masseuse speaks to him.  She shared her feelings with him and his immediate reaction was to proceed with divorce.  She explained this was not her intent. I also asked that they consider developing a regular time to make contact especially when she is away on business.  She admits that he says he loves when she goes out of town but immediately wishes she were back home. I talked with the patient about the importance of healthy communication and provided some examples.  I also wondered out loud if the reason he was contacting the masseuse was because she was emotionally available to him and the patient was not.  She became tearful and admits that this is likely true.  She talked about not having to time to  talk to him and I asked her to consider her priorities.  Suicidal/Homicidal: No  Plan: Return again in 2 weeks or as schedule permits.  Diagnosis: Axis I: Major depressive disorder    Axis II: No diagnosis    Kristina Martinez, [facility] 03/10/2012

## 2012-03-23 ENCOUNTER — Ambulatory Visit (HOSPITAL_COMMUNITY): Payer: Self-pay | Admitting: Psychology

## 2012-03-30 ENCOUNTER — Ambulatory Visit (INDEPENDENT_AMBULATORY_CARE_PROVIDER_SITE_OTHER): Payer: BC Managed Care – PPO | Admitting: Family Medicine

## 2012-03-30 ENCOUNTER — Encounter: Payer: Self-pay | Admitting: Family Medicine

## 2012-03-30 VITALS — BP 150/89 | HR 82 | Ht 67.0 in | Wt 250.0 lb

## 2012-03-30 DIAGNOSIS — IMO0001 Reserved for inherently not codable concepts without codable children: Secondary | ICD-10-CM

## 2012-03-30 DIAGNOSIS — M899 Disorder of bone, unspecified: Secondary | ICD-10-CM

## 2012-03-30 DIAGNOSIS — N819 Female genital prolapse, unspecified: Secondary | ICD-10-CM

## 2012-03-30 DIAGNOSIS — M898X9 Other specified disorders of bone, unspecified site: Secondary | ICD-10-CM

## 2012-03-30 DIAGNOSIS — M791 Myalgia, unspecified site: Secondary | ICD-10-CM

## 2012-03-30 DIAGNOSIS — Z23 Encounter for immunization: Secondary | ICD-10-CM

## 2012-03-30 DIAGNOSIS — R5383 Other fatigue: Secondary | ICD-10-CM

## 2012-03-30 DIAGNOSIS — M7989 Other specified soft tissue disorders: Secondary | ICD-10-CM

## 2012-03-30 DIAGNOSIS — G5600 Carpal tunnel syndrome, unspecified upper limb: Secondary | ICD-10-CM

## 2012-03-30 DIAGNOSIS — R5381 Other malaise: Secondary | ICD-10-CM

## 2012-03-30 NOTE — Progress Notes (Signed)
Subjective:    Patient ID: Kristina Martinez, female    DOB: Sep 18, 1970, 41 y.o.   MRN: [id number]  HPI Fatigue - Says feels like she can sleep all time and feels like this for year. No fevers or swollen lumps or bumps or lymph nodes. She's been very active lately. She sleeps fair.  Has had bone pain in her thighs.  Seems to be anytime with activity or without.  Says has had her whole life but getting worse over the last year. No triggers or alleviating symptoms..    Says felt like something is coming out of her vagina when she urinates.  WE had noticed some prolapse on exam during her CPE.  Has been doing here Kegels she said it scared her couple times so much in the emergency room. No significant pelvic pain.  Feet and ankle have been more swollen but has been on her feet more often.  BP are normally 120/70s.   Has been more noticeable in the last 3 weeks. Has been traveling more and eating more salt.  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   A year of hand numbnes when bends her wrist. Can happen at night. She sleeps with her wrists flexed under her chin.    Says no weakness.  Saw a chiropractor for this in the past and felt that it was helpful. She says she also did therapy one time and that was helpful as well. She has never worn braces or splints. She's never been diagnosed with carpal tunnel syndrome. She says it does bother her when she flexes her wrist and when she's been taxing on her foam for period of time.    She also feels she has plantar fasciitis. She said she had it several years ago and used inserts and it really helped. She is getting symptoms again she feels like it is exacerbated by the fact she's been seen on her feet a lot for the last month. She has been wearing very flat shoes which she said tends to aggravate it. Today she has on a good pair of sneakers.   Review of Systems No chest pain or shortness of breath.     BP 150/89   Pulse 82  Ht 5\' 7"  (1.702 m)  Wt 250 lb (113.399 kg)  BMI 39.16 kg/m2    No Known Allergies  Past Medical History  Diagnosis Date  . Obese   . ADD (attention deficit disorder)   . Depression   . IBS (irritable bowel syndrome)   . Ovarian cyst   . PMDD (premenstrual dysphoric disorder)   . Achilles tendon injury     Past Surgical History  Procedure Date  . Laparoscopic cholecystectomy 2011    History   Social History  . Marital Status: Married    Spouse Name: N/A    Number of Children: N/A  . Years of Education: N/A   Occupational History  . Not on file.   Social History Main Topics  . Smoking status: Never Smoker   . Smokeless tobacco: Never Used  . Alcohol Use: Yes     1/ in 6 months  . Drug Use: No  . Sexually Active: Not on file   Other Topics Concern  . Not on file   Social History Narrative  . No narrative on file    Family History  Problem Relation Age of Onset  . Skin cancer Mother  skin  . Prostate cancer Father   . Heart disease Father     AMI  . Heart failure Father   . Stroke Brother 40  . Breast cancer Maternal Grandmother   . Uterine cancer Maternal Grandmother     ? cervix  . Breast cancer Paternal Grandmother     Outpatient Encounter Prescriptions as of 03/30/2012  Medication Sig Dispense Refill  . fish oil-omega-3 fatty acids 1000 MG capsule Take 2 g by mouth daily.        . fluticasone (FLONASE) 50 MCG/ACT nasal spray Place 2 sprays into the nose daily.  32 g  2  . levonorgestrel-ethinyl estradiol (SEASONALE,INTROVALE,JOLESSA) 0.15-0.03 MG tablet Take 1 tablet by mouth daily.  3 Package  1  . methylphenidate (RITALIN) 20 MG tablet Take 1 tablet (20 mg total) by mouth daily.  30 tablet  0  . Multiple Vitamin (MULTIVITAMIN) tablet Take 1 tablet by mouth daily.      Marland Kitchen omeprazole (PRILOSEC) 40 MG capsule Take 1 capsule (40 mg total) by mouth daily.  90 capsule  1   Facility-Administered Encounter Medications as of 03/30/2012    Medication Dose Route Frequency Provider Last Rate Last Dose  . ipratropium (ATROVENT) 0.02 % nebulizer solution 0.5 mg  0.5 mg Nebulization Once Hassan Rowan, MD           Objective:   Physical Exam  Constitutional: She is oriented to person, place, and time. She appears well-developed and well-nourished.  HENT:  Head: Normocephalic and atraumatic.  Right Ear: External ear normal.  Left Ear: External ear normal.  Nose: Nose normal.  Mouth/Throat: Oropharynx is clear and moist.       TMs and canals are clear.   Eyes: Conjunctivae normal and EOM are normal. Pupils are equal, round, and reactive to light.  Neck: Neck supple. No thyromegaly present.  Cardiovascular: Normal rate, regular rhythm and normal heart sounds.   Pulmonary/Chest: Effort normal and breath sounds normal. She has no wheezes.  Abdominal: Soft. Bowel sounds are normal. She exhibits no distension and no mass. There is no tenderness. There is no rebound and no guarding.  Musculoskeletal:       Bilat trace edema of LE. Bilateral wrist with normal range of motion. Her pulse 2+ bilaterally. Negative Phalen's and negative Tinel's sign.   Lymphadenopathy:    She has no cervical adenopathy.  Neurological: She is alert and oriented to person, place, and time.  Skin: Skin is warm and dry.  Psychiatric: She has a normal mood and affect.          Assessment & Plan:  FAtigue - unclear etiology. We'll check her thyroid as well as check her for anemia. We will also check for B12 deficiency.  Lower extremity edema-I suspect this is mostly from standing on her feet and also from eating a high salt diet that she's been traveling. But we will rule out anemia as well as check for thyroid disease. We will also check her electrolytes. Discussed importance of getting back on a low salt diet and getting regular exercise. No redness or tenderness to suggest a DVT. Her blood pressures also significantly elevated today. And this can  also increase ankle edema.  Elevated blood pressure-she says she got upset at a family member right before she came and feels that that is why her blood pressures elevated. I would like to recheck this in a couple weeks and keep an eye on it. Followup in one month.  Bone  Pain - Will check vit D level.  An is clear etiology. She really has had this symptom for most of her life it has just been exacerbated recently. She is also overweight and does not exercise regularly which could be contributing to some weakness in the upper thighs.  Plantar fascitis - handout given. She's had this before and says that inserts really help. Encouraged her to do this again and I will give her hand on the stretching exercises and icing. If she's not noticing some improvement in the next 3-4 weeks and our office a call and she can schedule with Dr. Benjamin Stain for possible injections.  Pelvic prolapse - Will refer to GYn for further eval.   Carpal Tunnel - most likely based on her history thought neg for phalens and Tinnels sign. I went ahead and gave her cockup splint for her right arm. I offered to do both but she wanted to try with one wrist first. I encouraged her to wear them at bedtime if she's not improving over the next 3-4 weeks then please call the office and let me know. At that point I would consider further workup to see if it could be coming from her elbows or her neck. Otherwise followup in one month.  Flu vaccine given today.

## 2012-03-31 LAB — CBC WITH DIFFERENTIAL/PLATELET
Basophils Absolute: 0.1 10*3/uL (ref 0.0–0.1)
Basophils Relative: 1 % (ref 0–1)
Eosinophils Relative: 6 % — ABNORMAL HIGH (ref 0–5)
HCT: 36.6 % (ref 36.0–46.0)
Hemoglobin: 12 g/dL (ref 12.0–15.0)
MCH: 25.5 pg — ABNORMAL LOW (ref 26.0–34.0)
MCHC: 32.8 g/dL (ref 30.0–36.0)
MCV: 77.9 fL — ABNORMAL LOW (ref 78.0–100.0)
Monocytes Absolute: 0.6 10*3/uL (ref 0.1–1.0)
Monocytes Relative: 6 % (ref 3–12)
RDW: 15.5 % (ref 11.5–15.5)

## 2012-03-31 LAB — T4, FREE: Free T4: 1.01 ng/dL (ref 0.80–1.80)

## 2012-03-31 LAB — CK: Total CK: 72 U/L (ref 7–177)

## 2012-04-12 ENCOUNTER — Telehealth: Payer: Self-pay

## 2012-04-12 NOTE — Telephone Encounter (Signed)
Called pt to schedule appt from referral from Dr. Darra Lis office and pt states that the symptoms she was having could have been due to a UTI she had where she was seen in a [place] hospital over the weekend and was diagnosed having a UTI and received treatment. Pt states she will take the medicine she have for treatment and if she still have the symptoms will call office to schedule appointment.

## 2012-04-19 ENCOUNTER — Telehealth: Payer: Self-pay | Admitting: *Deleted

## 2012-04-19 NOTE — Telephone Encounter (Signed)
Pt notified and transferred to schedule appt.

## 2012-04-19 NOTE — Telephone Encounter (Signed)
It is not normal.  Recommend come in for U/A and cultlure.

## 2012-04-19 NOTE — Telephone Encounter (Signed)
Pt was in [place] 10 days ago and had to be seen for UTI. Was given antibiotics for 10 days and has 4 days left of the med. Still seeing blood in urine and didn't know if this was normal and would clear up or if needed to do something else. Please advise

## 2012-04-20 ENCOUNTER — Encounter: Payer: Self-pay | Admitting: Family Medicine

## 2012-04-20 ENCOUNTER — Ambulatory Visit (INDEPENDENT_AMBULATORY_CARE_PROVIDER_SITE_OTHER): Payer: BC Managed Care – PPO | Admitting: Family Medicine

## 2012-04-20 VITALS — BP 130/70 | HR 107 | Ht 67.0 in | Wt 248.0 lb

## 2012-04-20 DIAGNOSIS — N390 Urinary tract infection, site not specified: Secondary | ICD-10-CM

## 2012-04-20 LAB — POCT URINALYSIS DIPSTICK
Bilirubin, UA: NEGATIVE
Glucose, UA: NEGATIVE
Ketones, UA: NEGATIVE
Nitrite, UA: NEGATIVE

## 2012-04-20 MED ORDER — SULFAMETHOXAZOLE-TRIMETHOPRIM 800-160 MG PO TABS: 1.0000 | ORAL_TABLET | Freq: Two times a day (BID) | ORAL | Status: DC

## 2012-04-20 NOTE — Progress Notes (Signed)
  Subjective:    Patient ID: Kristina Martinez, female    DOB: [birth year], 41 y.o.   MRN: [id number]  HPI 10 days ago in [place] and went to the ER and dx with cystitis and given 14 days of cipro.  She has had blood in the urine since then.  Was visible earlier abut not today.  Had a lot of pressure when started and is mostly better but not completely. Had a lot of burning but not completely resolved. No fever, odor, or itching vaginally.  She has back pain on the right.  Father had bladder cancer in his 27s. Was not a smoker.  She has never been a smoker. No nausea or vomiting. Has been having urgency on and off for about 6 weeks.  No fever.    Review of Systems     Objective:   Physical Exam  Constitutional: She is oriented to person, place, and time. She appears well-developed and well-nourished.  HENT:  Head: Normocephalic and atraumatic.  Cardiovascular: Normal rate, regular rhythm and normal heart sounds.   Pulmonary/Chest: Effort normal and breath sounds normal.  Musculoskeletal:       No CVA tenderness   Neurological: She is alert and oriented to person, place, and time.  Skin: Skin is warm and dry.  Psychiatric: She has a normal mood and affect. Her behavior is normal.          Assessment & Plan:  UTI- we will call the hospital in [place] and see if they perform a urine culture. Her urine dipstick today is positive for blood and leukocytes. We'll change her antibiotic today we will send her sample for culture as well. Says we have those results back we can make sure that what she is taking a sensitive. If she completes the current course of antibiotics and is still not having resolution of her symptoms then recommend further evaluation by urology. Call if she starts to have any fevers or increase in back pain.

## 2012-04-20 NOTE — Addendum Note (Signed)
Addended by: Judie Petit A on: 04/20/2012 10:41 AM   Modules accepted: Orders

## 2012-04-20 NOTE — Patient Instructions (Addendum)
Urinary Tract Infection Urinary tract infections (UTIs) can develop anywhere along your urinary tract. Your urinary tract is your body's drainage system for removing wastes and extra water. Your urinary tract includes two kidneys, two ureters, a bladder, and a urethra. Your kidneys are a pair of bean-shaped organs. Each kidney is about the size of your fist. They are located below your ribs, one on each side of your spine. CAUSES Infections are caused by microbes, which are microscopic organisms, including fungi, viruses, and bacteria. These organisms are so small that they can only be seen through a microscope. Bacteria are the microbes that most commonly cause UTIs. SYMPTOMS  Symptoms of UTIs may vary by age and gender of the patient and by the location of the infection. Symptoms in young women typically include a frequent and intense urge to urinate and a painful, burning feeling in the bladder or urethra during urination. Older women and men are more likely to be tired, shaky, and weak and have muscle aches and abdominal pain. A fever may mean the infection is in your kidneys. Other symptoms of a kidney infection include pain in your back or sides below the ribs, nausea, and vomiting. DIAGNOSIS To diagnose a UTI, your caregiver will ask you about your symptoms. Your caregiver also will ask to provide a urine sample. The urine sample will be tested for bacteria and white blood cells. White blood cells are made by your body to help fight infection. TREATMENT  Typically, UTIs can be treated with medication. Because most UTIs are caused by a bacterial infection, they usually can be treated with the use of antibiotics. The choice of antibiotic and length of treatment depend on your symptoms and the type of bacteria causing your infection. HOME CARE INSTRUCTIONS  If you were prescribed antibiotics, take them exactly as your caregiver instructs you. Finish the medication even if you feel better after you  have only taken some of the medication.  Drink enough water and fluids to keep your urine clear or pale yellow.  Avoid caffeine, tea, and carbonated beverages. They tend to irritate your bladder.  Empty your bladder often. Avoid holding urine for long periods of time.  Empty your bladder before and after sexual intercourse.  After a bowel movement, women should cleanse from front to back. Use each tissue only once. SEEK MEDICAL CARE IF:   You have back pain.  You develop a fever.  Your symptoms do not begin to resolve within 3 days. SEEK IMMEDIATE MEDICAL CARE IF:   You have severe back pain or lower abdominal pain.  You develop chills.  You have nausea or vomiting.  You have continued burning or discomfort with urination. MAKE SURE YOU:   Understand these instructions.  Will watch your condition.  Will get help right away if you are not doing well or get worse. Document Released: 03/24/2005 Document Revised: 12/14/2011 Document Reviewed: 07/23/2011 [employer] Patient Information 2013 [employer].  

## 2012-04-20 NOTE — Addendum Note (Signed)
Addended by: Judie Petit A on: 04/20/2012 12:11 PM   Modules accepted: Orders

## 2012-04-21 ENCOUNTER — Telehealth: Payer: Self-pay | Admitting: Family Medicine

## 2012-04-21 NOTE — Telephone Encounter (Signed)
Please call patient and let her know I did get a copy for urine culture from [facility] emergency department. She did grow out Escherichia coli which is the most common urinary tract organism. It was sensitive to Cipro which they placed her on. I will definitely be interested to see her for repeat culture grows out any bacteria. Continue antibiotic for the weekends and she is still symptomatic.

## 2012-04-21 NOTE — Telephone Encounter (Signed)
Pt.notified

## 2012-04-23 LAB — URINE CULTURE

## 2012-04-24 ENCOUNTER — Telehealth: Payer: Self-pay | Admitting: *Deleted

## 2012-04-24 NOTE — Telephone Encounter (Signed)
Patient states, that since you changed the ABT's that she is starting to feel better and will call back about referral to urology.

## 2012-04-24 NOTE — Telephone Encounter (Signed)
Message copied by Florestine Avers on Mon Apr 24, 2012 11:13 AM ------      Message from: Nani Gasser D      Created: Sun Apr 23, 2012  8:35 PM       Call pt: Urine culture grew out insignificant growth. If she is still having sxs I would like to refer her to Urology.

## 2012-04-24 NOTE — Telephone Encounter (Signed)
Message copied by Florestine Avers on Mon Apr 24, 2012 11:01 AM ------      Message from: Nani Gasser D      Created: Sun Apr 23, 2012  8:35 PM       Call pt: Urine culture grew out insignificant growth. If she is still having sxs I would like to refer her to Urology.

## 2012-04-24 NOTE — Telephone Encounter (Signed)
Message copied by Florestine Avers on Mon Apr 24, 2012 11:44 AM ------      Message from: Nani Gasser D      Created: Sun Apr 23, 2012  8:35 PM       Call pt: Urine culture grew out insignificant growth. If she is still having sxs I would like to refer her to Urology.

## 2012-04-25 ENCOUNTER — Encounter: Payer: Self-pay | Admitting: Obstetrics & Gynecology

## 2012-05-12 ENCOUNTER — Other Ambulatory Visit: Payer: Self-pay | Admitting: *Deleted

## 2012-05-12 MED ORDER — METHYLPHENIDATE HCL 20 MG PO TABS: 20.0000 mg | ORAL_TABLET | Freq: Every day | ORAL | Status: DC

## 2012-06-14 ENCOUNTER — Ambulatory Visit: Payer: Self-pay | Admitting: Family Medicine

## 2012-06-27 ENCOUNTER — Encounter (HOSPITAL_COMMUNITY): Payer: Self-pay | Admitting: Psychology

## 2012-06-27 DIAGNOSIS — F909 Attention-deficit hyperactivity disorder, unspecified type: Secondary | ICD-10-CM

## 2012-06-27 DIAGNOSIS — F331 Major depressive disorder, recurrent, moderate: Secondary | ICD-10-CM

## 2012-06-27 DIAGNOSIS — F419 Anxiety disorder, unspecified: Secondary | ICD-10-CM

## 2012-06-27 NOTE — Progress Notes (Signed)
Patient ID: Kristina Martinez, female   DOB: 07-Jul-1970, 41 y.o.   MRN: [id number]  Outpatient Therapist Discharge Summary   Discharge Date:  06/27/2012 Reason for Discharge:  No contact in more than 90 days Diagnosis:  Axis I:   1. Major depressive disorder, recurrent episode, moderate   2. Anxiety   3. ADHD (attention deficit hyperactivity disorder)     Axis II:  None  Axis III:  Headache, heart murmur, back pain, sinusitis, hiatal hernia    Comments:  Resume therapy as requested.  Salley Scarlet

## 2012-08-10 ENCOUNTER — Ambulatory Visit: Payer: Self-pay | Admitting: Family Medicine

## 2012-08-11 ENCOUNTER — Encounter: Payer: Self-pay | Admitting: Family Medicine

## 2012-08-11 ENCOUNTER — Ambulatory Visit (INDEPENDENT_AMBULATORY_CARE_PROVIDER_SITE_OTHER): Payer: BC Managed Care – PPO | Admitting: Family Medicine

## 2012-08-11 VITALS — BP 143/87 | HR 114 | Ht 67.0 in | Wt 250.0 lb

## 2012-08-11 DIAGNOSIS — F329 Major depressive disorder, single episode, unspecified: Secondary | ICD-10-CM

## 2012-08-11 DIAGNOSIS — N390 Urinary tract infection, site not specified: Secondary | ICD-10-CM

## 2012-08-11 DIAGNOSIS — R300 Dysuria: Secondary | ICD-10-CM

## 2012-08-11 LAB — POCT URINALYSIS DIPSTICK
Bilirubin, UA: NEGATIVE
Glucose, UA: NEGATIVE
Nitrite, UA: NEGATIVE

## 2012-08-11 MED ORDER — BUPROPION HCL ER (XL) 150 MG PO TB24: 150.0000 mg | ORAL_TABLET | Freq: Every day | ORAL | Status: DC

## 2012-08-11 MED ORDER — CIPROFLOXACIN HCL 500 MG PO TABS: 500.0000 mg | ORAL_TABLET | Freq: Two times a day (BID) | ORAL | Status: DC

## 2012-08-11 NOTE — Progress Notes (Signed)
  Subjective:    Patient ID: Kristina Martinez, female    DOB: 1971-06-27, 42 y.o.   MRN: [id number]  HPI F/u depression  -   Says at the point she feels like she needs a medication.  She ws seeing a therapist for personal reason. She is also seeing a marriage counsel.  Has tried citalopram but made her shakey and lightheaded. Tried prozac and felt too flat. Says can fall alseep fine but hard time staying asleep and then can't get out of bed. She was seeing Kristina Martinez her downstairs for counseling. Kristina Martinez he has transferred to a new office. She became very tearful during the office visit today and said she did not talk about the details of it.   UTI x 1 week.  Urinary frequency and dysuria.  No heamturia or fever or back pain.  Has been drinking cranberry juice and lots of fluids.  No worsening or alleviating symptoms. She is a little bit of pain in the right flank but also for down the stairs about a week ago and says his sensorium percent.  Has had more reflux lately.  Has been having brash.  She has been or stressed. Admist hasn't made good food choices. She or he takes Prilosec 20 mg once daily. He's also been taking TUMS and Maalox for breakthrough symptoms. No vomiting or diarrhea. No abdominal pain.  Review of Systems     Objective:   Physical Exam  Constitutional: She is oriented to person, place, and time. She appears well-developed and well-nourished.  HENT:  Head: Normocephalic and atraumatic.  Eyes: Conjunctivae and EOM are normal.  Cardiovascular: Normal rate.   Pulmonary/Chest: Effort normal.  Neurological: She is alert and oriented to person, place, and time.  Skin: Skin is dry. No pallor.  Psychiatric: She has a normal mood and affect. Her behavior is normal.  Very tearful today.           Assessment & Plan:  MDD - PHQ- 9 score of 18 (severe). Discussed options. She would like to start medication. She had side effects with fluoxetine and citalopram in the past. We'll  start with Wellbutrin under 50 mg extended release. Followup in 4-6 weeks and we can adjust her dose at that time. Call if any concerns or side effects or problems.  UTI - UA + for leuk and blood.  Positive for UTI. Will treat with Cipro. We'll send for culture and call the results once they're available.  GERD- Increase Prilosec to 40mg   BID.  Given H.O on reflux.  Call if not improving over the next week to 2 weeks.

## 2012-08-15 LAB — URINALYSIS, MICROSCOPIC ONLY

## 2012-08-16 LAB — URINE CULTURE: Organism ID, Bacteria: NO GROWTH

## 2012-08-18 ENCOUNTER — Other Ambulatory Visit: Payer: Self-pay | Admitting: Family Medicine

## 2012-08-18 DIAGNOSIS — R3915 Urgency of urination: Secondary | ICD-10-CM

## 2012-08-18 DIAGNOSIS — R300 Dysuria: Secondary | ICD-10-CM

## 2012-10-16 ENCOUNTER — Other Ambulatory Visit: Payer: Self-pay | Admitting: *Deleted

## 2012-10-16 ENCOUNTER — Encounter: Payer: Self-pay | Admitting: *Deleted

## 2012-10-16 MED ORDER — BUPROPION HCL ER (XL) 150 MG PO TB24: 150.0000 mg | ORAL_TABLET | Freq: Every day | ORAL | Status: DC

## 2012-10-20 ENCOUNTER — Encounter: Payer: Self-pay | Admitting: Family Medicine

## 2012-10-20 ENCOUNTER — Ambulatory Visit (INDEPENDENT_AMBULATORY_CARE_PROVIDER_SITE_OTHER): Payer: BC Managed Care – PPO | Admitting: Family Medicine

## 2012-10-20 VITALS — BP 125/78 | HR 84 | Wt 248.0 lb

## 2012-10-20 DIAGNOSIS — R3915 Urgency of urination: Secondary | ICD-10-CM

## 2012-10-20 DIAGNOSIS — F329 Major depressive disorder, single episode, unspecified: Secondary | ICD-10-CM

## 2012-10-20 DIAGNOSIS — R300 Dysuria: Secondary | ICD-10-CM

## 2012-10-20 DIAGNOSIS — F32A Depression, unspecified: Secondary | ICD-10-CM

## 2012-10-20 LAB — POCT URINALYSIS DIPSTICK
Blood, UA: NEGATIVE
Glucose, UA: NEGATIVE
Spec Grav, UA: >=1.03

## 2012-10-20 MED ORDER — BUPROPION HCL ER (XL) 150 MG PO TB24: 150.0000 mg | ORAL_TABLET | Freq: Every day | ORAL | Status: DC

## 2012-10-20 MED ORDER — CIPROFLOXACIN HCL 500 MG PO TABS: 500.0000 mg | ORAL_TABLET | Freq: Two times a day (BID) | ORAL | Status: AC

## 2012-10-20 MED ORDER — METHYLPHENIDATE HCL 20 MG PO TABS: 20.0000 mg | ORAL_TABLET | Freq: Every day | ORAL | Status: DC

## 2012-10-20 NOTE — Progress Notes (Signed)
  Subjective:    Patient ID: Kristina Martinez, female    DOB: 05-30-1971, 42 y.o.   MRN: [id number]  HPI Depression - Dong well on it. Had HA the first month.  No SE now.  Says has constipation on it as well but taking  Stool softener.    UTI sxs started about 7 days ago.  Having more urgency than dysruria. No fever or back pain.  Given Gala Murdoch for her bladder through Urology. She's on the side effect profile that it can cause headaches and constipation says she has not started it yet. She wanted to get well regulated on the Wellbutrin first. No hematuria.  Review of Systems     Objective:   Physical Exam  Constitutional: She is oriented to person, place, and time. She appears well-developed and well-nourished.  HENT:  Head: Normocephalic and atraumatic.  Cardiovascular: Normal rate, regular rhythm and normal heart sounds.   Pulmonary/Chest: Effort normal and breath sounds normal.  Abdominal: Soft. There is no tenderness.  Musculoskeletal:  No CVA tenderness   Neurological: She is alert and oriented to person, place, and time.  Skin: Skin is warm and dry.  Psychiatric: She has a normal mood and affect. Her behavior is normal.          Assessment & Plan:  Depression - controlled. PHQ 9 score of 4 today. She is happy with current regimen. F/U in 2 months.    UTI- she is symptomatic but her urinalysis is negative. Since Friday and the weekend I will go ahead and treat her and then sent a culture for confirmation. We will call her with the results once available. Make sure drinking plan fluids to help flush the bladder. Avoid irritants like caffeine.

## 2012-10-20 NOTE — Patient Instructions (Signed)
Urinary Tract Infection Urinary tract infections (UTIs) can develop anywhere along your urinary tract. Your urinary tract is your body's drainage system for removing wastes and extra water. Your urinary tract includes two kidneys, two ureters, a bladder, and a urethra. Your kidneys are a pair of bean-shaped organs. Each kidney is about the size of your fist. They are located below your ribs, one on each side of your spine. CAUSES Infections are caused by microbes, which are microscopic organisms, including fungi, viruses, and bacteria. These organisms are so small that they can only be seen through a microscope. Bacteria are the microbes that most commonly cause UTIs. SYMPTOMS  Symptoms of UTIs may vary by age and gender of the patient and by the location of the infection. Symptoms in young women typically include a frequent and intense urge to urinate and a painful, burning feeling in the bladder or urethra during urination. Older women and men are more likely to be tired, shaky, and weak and have muscle aches and abdominal pain. A fever may mean the infection is in your kidneys. Other symptoms of a kidney infection include pain in your back or sides below the ribs, nausea, and vomiting. DIAGNOSIS To diagnose a UTI, your caregiver will ask you about your symptoms. Your caregiver also will ask to provide a urine sample. The urine sample will be tested for bacteria and white blood cells. White blood cells are made by your body to help fight infection. TREATMENT  Typically, UTIs can be treated with medication. Because most UTIs are caused by a bacterial infection, they usually can be treated with the use of antibiotics. The choice of antibiotic and length of treatment depend on your symptoms and the type of bacteria causing your infection. HOME CARE INSTRUCTIONS  If you were prescribed antibiotics, take them exactly as your caregiver instructs you. Finish the medication even if you feel better after you  have only taken some of the medication.  Drink enough water and fluids to keep your urine clear or pale yellow.  Avoid caffeine, tea, and carbonated beverages. They tend to irritate your bladder.  Empty your bladder often. Avoid holding urine for long periods of time.  Empty your bladder before and after sexual intercourse.  After a bowel movement, women should cleanse from front to back. Use each tissue only once. SEEK MEDICAL CARE IF:   You have back pain.  You develop a fever.  Your symptoms do not begin to resolve within 3 days. SEEK IMMEDIATE MEDICAL CARE IF:   You have severe back pain or lower abdominal pain.  You develop chills.  You have nausea or vomiting.  You have continued burning or discomfort with urination. MAKE SURE YOU:   Understand these instructions.  Will watch your condition.  Will get help right away if you are not doing well or get worse. Document Released: 03/24/2005 Document Revised: 12/14/2011 Document Reviewed: 07/23/2011 [employer] Patient Information 2013 [employer].  

## 2012-10-23 LAB — URINE CULTURE
Colony Count: NO GROWTH
Organism ID, Bacteria: NO GROWTH

## 2012-11-22 ENCOUNTER — Other Ambulatory Visit: Payer: Self-pay | Admitting: *Deleted

## 2012-11-22 MED ORDER — LEVONORGEST-ETH ESTRAD 91-DAY 0.15-0.03 MG PO TABS: 1.0000 | ORAL_TABLET | Freq: Every day | ORAL | Status: DC

## 2012-12-08 ENCOUNTER — Ambulatory Visit (INDEPENDENT_AMBULATORY_CARE_PROVIDER_SITE_OTHER): Payer: BC Managed Care – PPO | Admitting: Sports Medicine

## 2012-12-08 ENCOUNTER — Encounter: Payer: Self-pay | Admitting: Sports Medicine

## 2012-12-08 VITALS — BP 130/82 | HR 82

## 2012-12-08 DIAGNOSIS — M722 Plantar fascial fibromatosis: Secondary | ICD-10-CM

## 2012-12-08 DIAGNOSIS — M7710 Lateral epicondylitis, unspecified elbow: Secondary | ICD-10-CM

## 2012-12-08 DIAGNOSIS — M7711 Lateral epicondylitis, right elbow: Secondary | ICD-10-CM | POA: Insufficient documentation

## 2012-12-08 MED ORDER — MELOXICAM 15 MG PO TABS: ORAL_TABLET | ORAL | Status: DC

## 2012-12-08 NOTE — Assessment & Plan Note (Signed)
Bilateral heel cups. Home rehab. Mobic. Return in 4 weeks for consideration of orthotics injection if no better.

## 2012-12-08 NOTE — Assessment & Plan Note (Signed)
Mobic. Counter force brace Home exercises. Return in 2 weeks regarding this, injection if no better.

## 2012-12-08 NOTE — Progress Notes (Signed)
  Subjective:    CC: Pain  HPI: Right elbow pain: Localized at the lateral upper condyle, present for several days. Has changed her work routine, and tends to type with her right arm outstretched. Pain is localized, radiates into the mid upper arm, and mid forearm, but not to the fingers. Pain is moderate, stable. Better than yesterday. Has not used any oral analgesics.  Bilateral heel pain: Present for months, worse with the first few steps in the morning, worse at the calcaneal insertion of the plantar fascia, no radiation, moderate.   Past medical history, Surgical history, Family history not pertinant except as noted below, Social history, Allergies, and medications have been entered into the medical record, reviewed, and no changes needed.   Review of Systems: No fevers, chills, night sweats, weight loss, chest pain, or shortness of breath.   Objective:    General: Well Developed, well nourished, and in no acute distress.  Neuro: Alert and oriented x3, extra-ocular muscles intact, sensation grossly intact.  HEENT: Normocephalic, atraumatic, pupils equal round reactive to light, neck supple, no masses, no lymphadenopathy, thyroid nonpalpable.  Skin: Warm and dry, no rashes. Cardiac: Regular rate and rhythm, no murmurs rubs or gallops, no lower extremity edema.  Respiratory: Clear to auscultation bilaterally. Not using accessory muscles, speaking in full sentences. Right Elbow: Unremarkable to inspection. Range of motion full pronation, supination, flexion, extension. Strength is full to all of the above directions Stable to varus, valgus stress. Negative moving valgus stress test. Tender to palpation at the lateral upper condyle, reproduction of pain with resisted middle finger extension. Ulnar nerve does not sublux. Negative cubital tunnel Tinel's. Bilateral Foot: No visible erythema or swelling. Range of motion is full in all directions. Strength is 5/5 in all directions. No  hallux valgus. No pes cavus or pes planus. No abnormal callus noted. No pain over the navicular prominence, or base of fifth metatarsal. Tender to palpation of the calcaneal insertion of plantar fascia. No pain at the Achilles insertion. No pain over the calcaneal bursa. No pain of the retrocalcaneal bursa. No tenderness to palpation over the tarsals, metatarsals, or phalanges. No hallux rigidus or limitus. No tenderness palpation over interphalangeal joints. No pain with compression of the metatarsal heads. Neurovascularly intact distally.  Impression and Recommendations:

## 2013-01-11 ENCOUNTER — Telehealth: Payer: Self-pay | Admitting: *Deleted

## 2013-01-11 NOTE — Telephone Encounter (Signed)
Pt called asking for a refill on her ritalin & to possibly increase her dosage on the wellbutrin.  I advised pt that she will need to come in & be seen.  Pt voiced understanding & was transferred to scheduling to make appt.

## 2013-01-12 ENCOUNTER — Encounter: Payer: Self-pay | Admitting: Family Medicine

## 2013-01-12 ENCOUNTER — Ambulatory Visit (INDEPENDENT_AMBULATORY_CARE_PROVIDER_SITE_OTHER): Payer: BC Managed Care – PPO | Admitting: Family Medicine

## 2013-01-12 VITALS — BP 129/86 | HR 81 | Ht 67.0 in | Wt 244.0 lb

## 2013-01-12 DIAGNOSIS — F411 Generalized anxiety disorder: Secondary | ICD-10-CM

## 2013-01-12 DIAGNOSIS — F419 Anxiety disorder, unspecified: Secondary | ICD-10-CM

## 2013-01-12 DIAGNOSIS — F331 Major depressive disorder, recurrent, moderate: Secondary | ICD-10-CM

## 2013-01-12 MED ORDER — METHYLPHENIDATE HCL 20 MG PO TABS: 20.0000 mg | ORAL_TABLET | Freq: Every day | ORAL | Status: DC

## 2013-01-12 MED ORDER — BUPROPION HCL ER (XL) 150 MG PO TB24: 150.0000 mg | ORAL_TABLET | Freq: Two times a day (BID) | ORAL | Status: DC

## 2013-01-12 NOTE — Progress Notes (Signed)
  Subjective:    Patient ID: Kristina Martinez, female    DOB: 05-29-1971, 42 y.o.   MRN: [id number]  HPI She feels still lackof motivation. Having more sad days. Recently had a friend pass away.  Selling her in-laws home and just spent 2 weeks going through all their stuff and cleaning out the house. .  She has been teary.  She feels lack of motivation. Still have constipation and diarhea when first started the medcation but that has resolved. She has just been more emotional over the last month.    Review of Systems     Objective:   Physical Exam  Constitutional: She is oriented to person, place, and time. She appears well-developed and well-nourished.  HENT:  Head: Normocephalic and atraumatic.  Cardiovascular: Normal rate, regular rhythm and normal heart sounds.   Pulmonary/Chest: Effort normal and breath sounds normal.  Neurological: She is alert and oriented to person, place, and time.  Skin: Skin is warm and dry.  Psychiatric: She has a normal mood and affect. Her behavior is normal.          Assessment & Plan:  Depression/anxiety-Uncontrolled.  discussed different options. Will increase the Wellbutrin to 300 mg and see her back a month. I think she'll tolerate this well but she has any side effects please let me know. As they're going to 300 mg tablet she preferred to take two, 150 mg tabs. That way she can easily go back down if she wanted to. This is reasonable. Sent to mail order pharmacy. Also consider grief counseling if she still struggling after losing her friend recently.

## 2013-01-16 ENCOUNTER — Encounter: Payer: Self-pay | Admitting: Sports Medicine

## 2013-01-16 ENCOUNTER — Telehealth: Payer: Self-pay | Admitting: *Deleted

## 2013-01-16 ENCOUNTER — Ambulatory Visit (INDEPENDENT_AMBULATORY_CARE_PROVIDER_SITE_OTHER): Payer: BC Managed Care – PPO | Admitting: Sports Medicine

## 2013-01-16 VITALS — BP 132/90 | HR 90 | Wt 243.0 lb

## 2013-01-16 DIAGNOSIS — M722 Plantar fascial fibromatosis: Secondary | ICD-10-CM

## 2013-01-16 DIAGNOSIS — M7711 Lateral epicondylitis, right elbow: Secondary | ICD-10-CM

## 2013-01-16 DIAGNOSIS — M7710 Lateral epicondylitis, unspecified elbow: Secondary | ICD-10-CM

## 2013-01-16 MED ORDER — HYDROCODONE-ACETAMINOPHEN 5-325 MG PO TABS: 1.0000 | ORAL_TABLET | Freq: Three times a day (TID) | ORAL | Status: DC | PRN

## 2013-01-16 NOTE — Telephone Encounter (Signed)
Pt notified of rx. 

## 2013-01-16 NOTE — Assessment & Plan Note (Addendum)
Ultrasound-guided needle tenotomy. Exercises. Return in one month for this.  She did have a significant amount of pain after the injection, hydrocodone.

## 2013-01-16 NOTE — Progress Notes (Signed)
  Subjective:    CC: followup  HPI: Right lateral epicondylitis: Has really not been doing her exercises but she has been wearing the brace, pain is unchanged, localized to the lateral upper condyle, moderate, persistent, no radiation.  Bilateral plantar fasciitis: No better with exercises, pain is still persistent at the calcaneal insertion of the plantar fascia, moderate, persistent, no radiation.  Past medical history, Surgical history, Family history not pertinant except as noted below, Social history, Allergies, and medications have been entered into the medical record, reviewed, and no changes needed.   Review of Systems: No fevers, chills, night sweats, weight loss, chest pain, or shortness of breath.   Objective:    General: Well Developed, well nourished, and in no acute distress.  Neuro: Alert and oriented x3, extra-ocular muscles intact, sensation grossly intact.  HEENT: Normocephalic, atraumatic, pupils equal round reactive to light, neck supple, no masses, no lymphadenopathy, thyroid nonpalpable.  Skin: Warm and dry, no rashes. Cardiac: Regular rate and rhythm, no murmurs rubs or gallops, no lower extremity edema.  Respiratory: Clear to auscultation bilaterally. Not using accessory muscles, speaking in full sentences.  Procedure: Real-time Ultrasound Guided Injection of right lateral upper condyle Device: GE Logiq E  Verbal informed consent obtained.  Time-out conducted.  Noted no overlying erythema, induration, or other signs of local infection.  Skin prepped in a sterile fashion.  Local anesthesia: Topical Ethyl chloride.  With sterile technique and under real time ultrasound guidance:  25-gauge needle advanced to the origin of the common extensor tendon, 1 cc Kenalog 40, 2 cc lidocaine injected easily. Needle fenestration/tenotomy of the common extensor tendon origin was performed. Completed without difficulty  Pain immediately resolved suggesting accurate placement of  the medication.  Advised to call if fevers/chills, erythema, induration, drainage, or persistent bleeding.  Images permanently stored and available for review in the ultrasound unit.  Impression: Technically successful ultrasound guided injection.  Procedure: Real-time Ultrasound Guided Injection of left plantar fascia Device: GE Logiq E  Verbal informed consent obtained.  Time-out conducted.  Noted no overlying erythema, induration, or other signs of local infection.  Skin prepped in a sterile fashion.  Local anesthesia: Topical Ethyl chloride.  With sterile technique and under real time ultrasound guidance:  25-gauge needle advanced just deep to the calcaneal insertion of the plantar fascia, 1 cc Kenalog 40, 3 cc lidocaine injected easily. Completed without difficulty  Pain immediately resolved suggesting accurate placement of the medication.  Advised to call if fevers/chills, erythema, induration, drainage, or persistent bleeding.  Images permanently stored and available for review in the ultrasound unit.  Impression: Technically successful ultrasound guided injection.  Impression and Recommendations:

## 2013-01-16 NOTE — Telephone Encounter (Signed)
This is postprocedural pain. Prescription for hydrocodone as in my out box.

## 2013-01-16 NOTE — Assessment & Plan Note (Signed)
Injection of the left plantar fascia as above. Return for custom orthotics. Do exercises.

## 2013-01-16 NOTE — Telephone Encounter (Signed)
Pt calls in tears stating that after her tennis elbow injection her whole entire arm is throbbing.  [facility] [address] [facility] is her pharm.

## 2013-01-24 ENCOUNTER — Ambulatory Visit (INDEPENDENT_AMBULATORY_CARE_PROVIDER_SITE_OTHER): Payer: BC Managed Care – PPO | Admitting: Sports Medicine

## 2013-01-24 ENCOUNTER — Encounter: Payer: Self-pay | Admitting: Sports Medicine

## 2013-01-24 VITALS — BP 129/83 | HR 75 | Wt 243.0 lb

## 2013-01-24 DIAGNOSIS — M722 Plantar fascial fibromatosis: Secondary | ICD-10-CM

## 2013-01-24 DIAGNOSIS — M7711 Lateral epicondylitis, right elbow: Secondary | ICD-10-CM

## 2013-01-24 DIAGNOSIS — M7710 Lateral epicondylitis, unspecified elbow: Secondary | ICD-10-CM

## 2013-01-24 NOTE — Assessment & Plan Note (Signed)
Resolve after injection. 

## 2013-01-24 NOTE — Assessment & Plan Note (Signed)
Overall resolved after injection. Custom orthotics as above.

## 2013-01-24 NOTE — Progress Notes (Signed)
    Patient was fitted for a : standard, cushioned, semi-rigid orthotic. The orthotic was heated and afterward the patient stood on the orthotic blank positioned on the orthotic stand. The patient was positioned in subtalar neutral position and 10 degrees of ankle dorsiflexion in a weight bearing stance. After completion of molding, a stable base was applied to the orthotic blank. The blank was ground to a stable position for weight bearing. Size: 8 Base: Blue EVA Additional Posting and Padding: None The patient ambulated these, and they were very comfortable.  I spent 40 minutes with this patient, greater than 50% was face-to-face time counseling regarding the below diagnosis.   

## 2013-03-23 ENCOUNTER — Encounter: Payer: Self-pay | Admitting: Family Medicine

## 2013-03-23 ENCOUNTER — Ambulatory Visit (INDEPENDENT_AMBULATORY_CARE_PROVIDER_SITE_OTHER): Payer: BC Managed Care – PPO | Admitting: Family Medicine

## 2013-03-23 VITALS — BP 130/87 | HR 100 | Wt 243.0 lb

## 2013-03-23 DIAGNOSIS — R51 Headache: Secondary | ICD-10-CM

## 2013-03-23 MED ORDER — KETOROLAC TROMETHAMINE 60 MG/2ML IM SOLN
60.0000 mg | Freq: Once | INTRAMUSCULAR | Status: AC
  Administered 2013-03-23: 60 mg via INTRAMUSCULAR

## 2013-03-23 MED ORDER — BUTALBITAL-APAP-CAFFEINE 50-325-40 MG PO TABS: 1.0000 | ORAL_TABLET | Freq: Two times a day (BID) | ORAL | Status: DC | PRN

## 2013-03-23 MED ORDER — PROMETHAZINE HCL 25 MG/ML IJ SOLN
25.0000 mg | Freq: Once | INTRAMUSCULAR | Status: AC
  Administered 2013-03-23: 25 mg via INTRAMUSCULAR

## 2013-03-23 NOTE — Addendum Note (Signed)
Addended by: Deno Etienne on: 03/23/2013 05:49 PM   Modules accepted: Orders

## 2013-03-23 NOTE — Progress Notes (Signed)
  Subjective:    Patient ID: Kristina Martinez, female    DOB: Nov 21, 1970, 42 y.o.   MRN: [id number]  HPI HA x 3 weeks. Using execedrin migraine and IBU and helps some.  At base of skull and over left eye. Used to get migraines in her 13s.  No sign neck pain.  Left eye has been twitching on the lower lid.  No fever. No URI sxs.  + nausea.  Rates them as moderate.   cna throb at times. No known triggers such as increased stress, poor sleep, increased caffeine or change in caffeine, or dietary changes.  HA is mild currently  after taking 4 ibuprofen, about an hour ago.  Review of Systems     Objective:   Physical Exam  Constitutional: She is oriented to person, place, and time. She appears well-developed and well-nourished.  HENT:  Head: Normocephalic and atraumatic.  Eyes: Conjunctivae are normal. Pupils are equal, round, and reactive to light.  Neck: Neck supple. No thyromegaly present.  Cardiovascular: Normal rate, regular rhythm and normal heart sounds.   Musculoskeletal:  Neck with normal range of motion. She's a little bit tight in her trapezius muscles bilaterally but nontender. Nontender over the cervical spine or cervical paraspinous muscles.  Lymphadenopathy:    She has no cervical adenopathy.  Neurological: She is alert and oriented to person, place, and time.  Skin: Skin is warm and dry.  Psychiatric: She has a normal mood and affect. Her behavior is normal.          Assessment & Plan:  Headache-this sounds very much like migraine headache. Will give her injection of Toradol and Phenergan. She'll go home and rest. I also gave her prescription for Fioricet to use as needed to see if it's helpful or not. She's leaving town on Monday. If she's still unable to break the headache cycle then consider a round of steroids. She does not have any tenderness or soreness in the neck muscles to suggest musculoskeletal cause.

## 2013-04-10 ENCOUNTER — Emergency Department (HOSPITAL_BASED_OUTPATIENT_CLINIC_OR_DEPARTMENT_OTHER): Payer: BC Managed Care – PPO

## 2013-04-10 ENCOUNTER — Emergency Department (HOSPITAL_BASED_OUTPATIENT_CLINIC_OR_DEPARTMENT_OTHER)
Admission: EM | Admit: 2013-04-10 | Discharge: 2013-04-10 | Disposition: A | Discharge status: Home / Self Care | Payer: BC Managed Care – PPO | Attending: Emergency Medicine | Admitting: Emergency Medicine

## 2013-04-10 ENCOUNTER — Encounter (HOSPITAL_BASED_OUTPATIENT_CLINIC_OR_DEPARTMENT_OTHER): Payer: Self-pay | Admitting: Emergency Medicine

## 2013-04-10 DIAGNOSIS — F329 Major depressive disorder, single episode, unspecified: Secondary | ICD-10-CM | POA: Insufficient documentation

## 2013-04-10 DIAGNOSIS — N390 Urinary tract infection, site not specified: Secondary | ICD-10-CM

## 2013-04-10 DIAGNOSIS — Z3202 Encounter for pregnancy test, result negative: Secondary | ICD-10-CM | POA: Insufficient documentation

## 2013-04-10 DIAGNOSIS — F3289 Other specified depressive episodes: Secondary | ICD-10-CM | POA: Insufficient documentation

## 2013-04-10 DIAGNOSIS — Z8742 Personal history of other diseases of the female genital tract: Secondary | ICD-10-CM | POA: Insufficient documentation

## 2013-04-10 DIAGNOSIS — Z87828 Personal history of other (healed) physical injury and trauma: Secondary | ICD-10-CM | POA: Insufficient documentation

## 2013-04-10 DIAGNOSIS — Z79899 Other long term (current) drug therapy: Secondary | ICD-10-CM | POA: Insufficient documentation

## 2013-04-10 DIAGNOSIS — E669 Obesity, unspecified: Secondary | ICD-10-CM | POA: Insufficient documentation

## 2013-04-10 DIAGNOSIS — K5900 Constipation, unspecified: Secondary | ICD-10-CM | POA: Insufficient documentation

## 2013-04-10 DIAGNOSIS — F988 Other specified behavioral and emotional disorders with onset usually occurring in childhood and adolescence: Secondary | ICD-10-CM | POA: Insufficient documentation

## 2013-04-10 DIAGNOSIS — Z9889 Other specified postprocedural states: Secondary | ICD-10-CM | POA: Insufficient documentation

## 2013-04-10 DIAGNOSIS — K859 Acute pancreatitis without necrosis or infection, unspecified: Secondary | ICD-10-CM | POA: Insufficient documentation

## 2013-04-10 LAB — CBC WITH DIFFERENTIAL/PLATELET
Basophils Absolute: 0.1 K/uL (ref 0.0–0.1)
Basophils Relative: 1 % (ref 0–1)
Eosinophils Absolute: 0.5 K/uL (ref 0.0–0.7)
Eosinophils Relative: 5 % (ref 0–5)
HCT: 36.1 % (ref 36.0–46.0)
Hemoglobin: 11.7 g/dL — ABNORMAL LOW (ref 12.0–15.0)
Lymphocytes Relative: 22 % (ref 12–46)
Lymphs Abs: 2.5 K/uL (ref 0.7–4.0)
MCH: 24.9 pg — ABNORMAL LOW (ref 26.0–34.0)
MCHC: 32.4 g/dL (ref 30.0–36.0)
MCV: 77 fL — ABNORMAL LOW (ref 78.0–100.0)
Monocytes Absolute: 0.7 K/uL (ref 0.1–1.0)
Monocytes Relative: 6 % (ref 3–12)
Neutro Abs: 7.6 K/uL (ref 1.7–7.7)
Neutrophils Relative %: 67 % (ref 43–77)
Platelets: 395 K/uL (ref 150–400)
RBC: 4.69 MIL/uL (ref 3.87–5.11)
RDW: 15.6 % — ABNORMAL HIGH (ref 11.5–15.5)
WBC: 11.4 K/uL — ABNORMAL HIGH (ref 4.0–10.5)

## 2013-04-10 LAB — URINE MICROSCOPIC-ADD ON

## 2013-04-10 LAB — PREGNANCY, URINE: Preg Test, Ur: NEGATIVE

## 2013-04-10 LAB — LIPASE, BLOOD: Lipase: 89 U/L — ABNORMAL HIGH (ref 11–59)

## 2013-04-10 LAB — COMPREHENSIVE METABOLIC PANEL
ALT: 12 U/L (ref 0–35)
AST: 14 U/L (ref 0–37)
Alkaline Phosphatase: 125 U/L — ABNORMAL HIGH (ref 39–117)
BUN: 8 mg/dL (ref 6–23)
CO2: 24 mEq/L (ref 19–32)
Calcium: 9.5 mg/dL (ref 8.4–10.5)
Chloride: 101 mEq/L (ref 96–112)
GFR calc Af Amer: >90 mL/min (ref >90)
GFR calc non Af Amer: >90 mL/min (ref >90)
Glucose, Bld: 125 mg/dL — ABNORMAL HIGH (ref 70–99)
Potassium: 3.7 mEq/L (ref 3.5–5.1)
Sodium: 137 mEq/L (ref 135–145)
Total Bilirubin: 0.3 mg/dL (ref 0.3–1.2)

## 2013-04-10 LAB — URINALYSIS, ROUTINE W REFLEX MICROSCOPIC
Bilirubin Urine: NEGATIVE
Glucose, UA: NEGATIVE mg/dL
Hgb urine dipstick: NEGATIVE
Ketones, ur: NEGATIVE mg/dL
Nitrite: NEGATIVE
Protein, ur: NEGATIVE mg/dL
Specific Gravity, Urine: 1.019 (ref 1.005–1.030)
Urobilinogen, UA: 0.2 mg/dL (ref 0.0–1.0)
pH: 5.5 (ref 5.0–8.0)

## 2013-04-10 MED ORDER — PANTOPRAZOLE SODIUM 40 MG IV SOLR
40.0000 mg | Freq: Once | INTRAVENOUS | Status: AC
  Administered 2013-04-10: 40 mg via INTRAVENOUS
  Filled 2013-04-10: qty 40

## 2013-04-10 MED ORDER — FENTANYL CITRATE 0.05 MG/ML IJ SOLN
100.0000 ug | Freq: Once | INTRAMUSCULAR | Status: AC
  Administered 2013-04-10: 100 ug via INTRAVENOUS
  Filled 2013-04-10: qty 2

## 2013-04-10 MED ORDER — ONDANSETRON HCL 4 MG/2ML IJ SOLN
4.0000 mg | Freq: Once | INTRAMUSCULAR | Status: AC
  Administered 2013-04-10: 4 mg via INTRAVENOUS

## 2013-04-10 MED ORDER — HYDROMORPHONE HCL PF 1 MG/ML IJ SOLN
1.0000 mg | Freq: Once | INTRAMUSCULAR | Status: AC
  Administered 2013-04-10: 1 mg via INTRAVENOUS
  Filled 2013-04-10: qty 1

## 2013-04-10 MED ORDER — IOHEXOL 300 MG/ML  SOLN
100.0000 mL | Freq: Once | INTRAMUSCULAR | Status: AC | PRN
  Administered 2013-04-10: 100 mL via INTRAVENOUS

## 2013-04-10 MED ORDER — SODIUM CHLORIDE 0.9 % IV SOLN
INTRAVENOUS | Status: DC
  Administered 2013-04-10: 04:00:00 via INTRAVENOUS

## 2013-04-10 MED ORDER — ONDANSETRON 8 MG PO TBDP: 8.0000 mg | ORAL_TABLET | Freq: Three times a day (TID) | ORAL | Status: DC | PRN

## 2013-04-10 MED ORDER — ONDANSETRON HCL 4 MG/2ML IJ SOLN
4.0000 mg | Freq: Once | INTRAMUSCULAR | Status: DC
  Filled 2013-04-10: qty 2

## 2013-04-10 MED ORDER — NITROFURANTOIN MONOHYD MACRO 100 MG PO CAPS: 100.0000 mg | ORAL_CAPSULE | Freq: Two times a day (BID) | ORAL | Status: DC

## 2013-04-10 MED ORDER — NITROFURANTOIN MONOHYD MACRO 100 MG PO CAPS
100.0000 mg | ORAL_CAPSULE | Freq: Once | ORAL | Status: AC
  Administered 2013-04-10: 100 mg via ORAL
  Filled 2013-04-10: qty 1

## 2013-04-10 MED ORDER — HYDROMORPHONE HCL 4 MG PO TABS: 2.0000 mg | ORAL_TABLET | ORAL | Status: DC | PRN

## 2013-04-10 NOTE — ED Provider Notes (Signed)
CSN: [id number]     Arrival date & time 04/10/13  7829 History   First MD Initiated Contact with Patient 04/10/13 [phone]     Chief Complaint  Patient presents with  . Abdominal Pain   (Consider location/radiation/quality/duration/timing/severity/associated sxs/prior Treatment) HPI This is a 42 year old female with epigastric pain since yesterday morning. She rates the pain as an 8/10. It is worse with palpation, movement or eating. She has some transient relief with ibuprofen. She has had nausea but no vomiting. She is somewhat constipated. She is unaware of having fever. She is taking MiraLax without relief. The pain is characterized as similar to previous gallbladder pain although she is now status post cholecystectomy. She feels like her abdomen is distended.  Past Medical History  Diagnosis Date  . Obese   . ADD (attention deficit disorder)   . Depression   . IBS (irritable bowel syndrome)   . Ovarian cyst   . PMDD (premenstrual dysphoric disorder)   . Achilles tendon injury    Past Surgical History  Procedure Laterality Date  . Laparoscopic cholecystectomy  2011   Family History  Problem Relation Age of Onset  . Skin cancer Mother     skin  . Prostate cancer Father   . Heart disease Father     AMI  . Heart failure Father   . Stroke Brother 40  . Breast cancer Maternal Grandmother   . Uterine cancer Maternal Grandmother     ? cervix  . Breast cancer Paternal Grandmother    History  Substance Use Topics  . Smoking status: Never Smoker   . Smokeless tobacco: Never Used  . Alcohol Use: Yes     Comment: 1/ in 6 months   OB History   Grav Para Term Preterm Abortions TAB SAB Ect Mult Living                 Review of Systems  All other systems reviewed and are negative.    Allergies  Review of patient's allergies indicates no known allergies.  Home Medications   Current Outpatient Rx  Name  Route  Sig  Dispense  Refill  . buPROPion (WELLBUTRIN XL) 150 MG 24  hr tablet   Oral   Take 1 tablet (150 mg total) by mouth 2 (two) times daily.   180 tablet   0   . butalbital-acetaminophen-caffeine (FIORICET) 50-325-40 MG per tablet   Oral   Take 1 tablet by mouth 2 (two) times daily as needed for headache.   14 tablet   0   . levonorgestrel-ethinyl estradiol (SEASONALE,INTROVALE,JOLESSA) 0.15-0.03 MG tablet   Oral   Take 1 tablet by mouth daily.   3 Package   3   . methylphenidate (RITALIN) 20 MG tablet   Oral   Take 1 tablet (20 mg total) by mouth daily.   30 tablet   0   . omeprazole (PRILOSEC) 40 MG capsule   Oral   Take 1 capsule (40 mg total) by mouth daily.   90 capsule   1   . polyethylene glycol (MIRALAX / GLYCOLAX) packet   Oral   Take 17 g by mouth daily.         Marland Kitchen EXPIRED: fluticasone (FLONASE) 50 MCG/ACT nasal spray   Nasal   Place 2 sprays into the nose daily.   32 g   2   . Multiple Vitamin (MULTIVITAMIN) tablet   Oral   Take 1 tablet by mouth daily.  BP 149/91  Pulse 74  Temp(Src) 98.1 F (36.7 C) (Oral)  Resp 22  SpO2 98%  LMP 03/11/2013  Physical Exam General: Well-developed, well-nourished female in no acute distress; appearance consistent with age of record HENT: normocephalic; atraumatic Eyes: pupils equal, round and reactive to light; extraocular muscles intact Neck: supple Heart: regular rate and rhythm Lungs: clear to auscultation bilaterally Abdomen: soft; nondistended; epigastric tenderness and less prominent left upper quadrant tenderness; no masses or hepatosplenomegaly; bowel sounds decreased Extremities: No deformity; full range of motion; pulses normal; no edema Neurologic: Awake, alert and oriented; motor function intact in all extremities and symmetric; no facial droop Skin: Warm and dry Psychiatric: Mildly anxious    ED Course  Procedures (including critical care time)    MDM   Nursing notes and vitals signs, including pulse oximetry, reviewed.  Summary of  this visit's results, reviewed by myself:  Labs:  Results for orders placed during the hospital encounter of 04/10/13 (from the past 24 hour(s))  LIPASE, BLOOD     Status: Abnormal   Collection Time    04/10/13  4:15 AM      Result Value Range   Lipase 89 (*) 11 - 59 U/L  COMPREHENSIVE METABOLIC PANEL     Status: Abnormal   Collection Time    04/10/13  4:15 AM      Result Value Range   Sodium 137  135 - 145 mEq/L   Potassium 3.7  3.5 - 5.1 mEq/L   Chloride 101  96 - 112 mEq/L   CO2 24  19 - 32 mEq/L   Glucose, Bld 125 (*) 70 - 99 mg/dL   BUN 8  6 - 23 mg/dL   Creatinine, Ser 1.61  0.50 - 1.10 mg/dL   Calcium 9.5  8.4 - 09.6 mg/dL   Total Protein 7.3  6.0 - 8.3 g/dL   Albumin 3.3 (*) 3.5 - 5.2 g/dL   AST 14  0 - 37 U/L   ALT 12  0 - 35 U/L   Alkaline Phosphatase 125 (*) 39 - 117 U/L   Total Bilirubin 0.3  0.3 - 1.2 mg/dL   GFR calc non Af Amer >90  >90 mL/min   GFR calc Af Amer >90  >90 mL/min  CBC WITH DIFFERENTIAL     Status: Abnormal   Collection Time    04/10/13  4:15 AM      Result Value Range   WBC 11.4 (*) 4.0 - 10.5 K/uL   RBC 4.69  3.87 - 5.11 MIL/uL   Hemoglobin 11.7 (*) 12.0 - 15.0 g/dL   HCT 04.5  40.9 - 81.1 %   MCV 77.0 (*) 78.0 - 100.0 fL   MCH 24.9 (*) 26.0 - 34.0 pg   MCHC 32.4  30.0 - 36.0 g/dL   RDW 91.4 (*) 78.2 - 95.6 %   Platelets 395  150 - 400 K/uL   Neutrophils Relative % 67  43 - 77 %   Neutro Abs 7.6  1.7 - 7.7 K/uL   Lymphocytes Relative 22  12 - 46 %   Lymphs Abs 2.5  0.7 - 4.0 K/uL   Monocytes Relative 6  3 - 12 %   Monocytes Absolute 0.7  0.1 - 1.0 K/uL   Eosinophils Relative 5  0 - 5 %   Eosinophils Absolute 0.5  0.0 - 0.7 K/uL   Basophils Relative 1  0 - 1 %   Basophils Absolute 0.1  0.0 - 0.1 K/uL  URINALYSIS, ROUTINE W REFLEX MICROSCOPIC     Status: Abnormal   Collection Time    04/10/13  5:00 AM      Result Value Range   Color, Urine YELLOW  YELLOW   APPearance CLOUDY (*) CLEAR   Specific Gravity, Urine 1.019  1.005 - 1.030    pH 5.5  5.0 - 8.0   Glucose, UA NEGATIVE  NEGATIVE mg/dL   Hgb urine dipstick NEGATIVE  NEGATIVE   Bilirubin Urine NEGATIVE  NEGATIVE   Ketones, ur NEGATIVE  NEGATIVE mg/dL   Protein, ur NEGATIVE  NEGATIVE mg/dL   Urobilinogen, UA 0.2  0.0 - 1.0 mg/dL   Nitrite NEGATIVE  NEGATIVE   Leukocytes, UA SMALL (*) NEGATIVE  PREGNANCY, URINE     Status: None   Collection Time    04/10/13  5:00 AM      Result Value Range   Preg Test, Ur NEGATIVE  NEGATIVE  URINE MICROSCOPIC-ADD ON     Status: Abnormal   Collection Time    04/10/13  5:00 AM      Result Value Range   Squamous Epithelial / LPF FEW (*) RARE   WBC, UA 7-10  <3 WBC/hpf   RBC / HPF 0-2  <3 RBC/hpf   Bacteria, UA FEW (*) RARE   Urine-Other MUCOUS PRESENT      Imaging Studies: Ct Abdomen Pelvis W Contrast  04/10/2013   CLINICAL DATA:  Elevated lipase.  EXAM: CT ABDOMEN AND PELVIS WITH CONTRAST  TECHNIQUE: Multidetector CT imaging of the abdomen and pelvis was performed using the standard protocol following bolus administration of intravenous contrast.  CONTRAST:  OMNIPAQUE IOHEXOL 300 MG/ML  SOLN  COMPARISON:  01/28/2010  FINDINGS: BODY WALL: Fat containing umbilical hernia.  LOWER CHEST:  Mediastinum: Moderate sliding-type hiatal hernia.  Lungs/pleura: No consolidation.  ABDOMEN/PELVIS:  Liver: No focal abnormality.  Biliary: Cholecystectomy.  Pancreas: Minimal fat stranding around the uncinate process. No fluid collection. No ductal enlargement.  Spleen: Unremarkable.  Adrenals: Unremarkable.  Kidneys and ureters: No hydronephrosis or stone.  Bladder: Unremarkable.  Bowel: No obstruction. Normal appendix. Moderate number of colonic diverticula.  Retroperitoneum: No mass or adenopathy.  Peritoneum: No free fluid or gas.  Reproductive: Unremarkable.  Vascular: No acute abnormality.  OSSEOUS: L5 pars defects with focally advanced degenerative disc disease and mild anterolisthesis. No suspicious lytic or blastic lesions.  IMPRESSION:  1. Subtle inflammatory changes around the pancreatic uncinate, correlating with history of pancreatitis. No fluid collection or necrosis. 2. Cholecystectomy. 3. Hiatal hernia. 4. Colonic diverticulosis.   Electronically Signed   By: Tiburcio Pea M.D.   On: 04/10/2013 05:56   6:08 AM Pain controlled with IV medications. Dilaudid has worked better than fentanyl. Her lipase and CT findings are consistent with a mild pancreatitis. Admission does not appear indicated at this time. She will followup with her primary care physician Dr. Adele Dan.     Hanley Seamen, MD 04/10/13 [phone]

## 2013-04-10 NOTE — ED Notes (Signed)
C/o upper abd pain since Monday morning, denies any vomiting or fever. Pt has had issues with constipation but no relief from Miralax tonight.

## 2013-04-10 NOTE — ED Notes (Signed)
Urine specimen requested and labeled specimen container provided

## 2013-04-11 ENCOUNTER — Telehealth: Payer: Self-pay | Admitting: *Deleted

## 2013-04-11 LAB — URINE CULTURE: Colony Count: >=100000

## 2013-04-11 NOTE — Telephone Encounter (Signed)
Pt informed to stay with clear liquids. Her results from the hospital are in teh computer and pt will be here tomorrow at 10am to se you.  Meyer Cory, LPN

## 2013-04-11 NOTE — Telephone Encounter (Signed)
Pt was seen at [facility] yesterday for pancreatitis. She is asking about what and when she should be able to eat. Also would you like her to f/u with you and when? Meyer Cory, LPN

## 2013-04-11 NOTE — Telephone Encounter (Signed)
She needs to stay on a liquid diet until she follows up. We probably should schedule her for either Thursday or Friday this week. Call tp get records.

## 2013-04-12 ENCOUNTER — Encounter: Payer: Self-pay | Admitting: Family Medicine

## 2013-04-12 ENCOUNTER — Other Ambulatory Visit: Payer: Self-pay | Admitting: Family Medicine

## 2013-04-12 ENCOUNTER — Ambulatory Visit (INDEPENDENT_AMBULATORY_CARE_PROVIDER_SITE_OTHER): Payer: BC Managed Care – PPO | Admitting: Family Medicine

## 2013-04-12 VITALS — BP 142/85 | HR 92 | Wt 234.0 lb

## 2013-04-12 DIAGNOSIS — K859 Acute pancreatitis without necrosis or infection, unspecified: Secondary | ICD-10-CM

## 2013-04-12 MED ORDER — PROMETHAZINE HCL 25 MG/ML IJ SOLN
50.0000 mg | Freq: Once | INTRAMUSCULAR | Status: AC
  Administered 2013-04-12: 50 mg via INTRAMUSCULAR

## 2013-04-12 NOTE — Progress Notes (Signed)
Subjective:    Patient ID: Kristina Martinez, female    DOB: [birth year], 42 y.o.   MRN: [id number]  HPI Was dx w/ pancreatitis on 04/10/13 at the ED. D/c home with phenergan , pain meds.  Still very nauseated.  Also dx with UTI bc felt so nauseated she couldn't take it.  Using hydromorphone for the pain.  She is still have some back pain. She is on clear liquids for about the last 60 hours.  Taking stool softerner twice a day. She acutlaly felt a little better yesterday than today. Hasn't had CM since Monday (4 days). Hx of constipation. + bloated.     Her husband is here with her today and is driving.  Review of Systems  BP 142/85  Pulse 92  Wt 234 lb (106.142 kg)  BMI 36.64 kg/m2  LMP 03/11/2013    No Known Allergies  Past Medical History  Diagnosis Date  . Obese   . ADD (attention deficit disorder)   . Depression   . IBS (irritable bowel syndrome)   . Ovarian cyst   . PMDD (premenstrual dysphoric disorder)   . Achilles tendon injury     Past Surgical History  Procedure Laterality Date  . Laparoscopic cholecystectomy  2011    History   Social History  . Marital Status: Married    Spouse Name: N/A    Number of Children: N/A  . Years of Education: N/A   Occupational History  . Not on file.   Social History Main Topics  . Smoking status: Never Smoker   . Smokeless tobacco: Never Used  . Alcohol Use: Yes     Comment: 1/ in 6 months  . Drug Use: No  . Sexual Activity: Not on file   Other Topics Concern  . Not on file   Social History Narrative  . No narrative on file    Family History  Problem Relation Age of Onset  . Skin cancer Mother     skin  . Prostate cancer Father   . Heart disease Father     AMI  . Heart failure Father   . Stroke Brother 40  . Breast cancer Maternal Grandmother   . Uterine cancer Maternal Grandmother     ? cervix  . Breast cancer Paternal Grandmother     Outpatient Encounter Prescriptions as of 04/12/2013  Medication Sig  Dispense Refill  . buPROPion (WELLBUTRIN XL) 150 MG 24 hr tablet Take 1 tablet (150 mg total) by mouth 2 (two) times daily.  180 tablet  0  . butalbital-acetaminophen-caffeine (FIORICET) 50-325-40 MG per tablet Take 1 tablet by mouth 2 (two) times daily as needed for headache.  14 tablet  0  . HYDROmorphone (DILAUDID) 4 MG tablet Take 0.5-1 tablets (2-4 mg total) by mouth every 4 (four) hours as needed for pain.  30 tablet  0  . levonorgestrel-ethinyl estradiol (SEASONALE,INTROVALE,JOLESSA) 0.15-0.03 MG tablet Take 1 tablet by mouth daily.  3 Package  3  . methylphenidate (RITALIN) 20 MG tablet Take 1 tablet (20 mg total) by mouth daily.  30 tablet  0  . Multiple Vitamin (MULTIVITAMIN) tablet Take 1 tablet by mouth daily.      . nitrofurantoin, macrocrystal-monohydrate, (MACROBID) 100 MG capsule Take 1 capsule (100 mg total) by mouth 2 (two) times daily. X 7 days  14 capsule  0  . ondansetron (ZOFRAN ODT) 8 MG disintegrating tablet Take 1 tablet (8 mg total) by mouth every 8 (eight) hours as needed for  nausea.  20 tablet  0  . fluticasone (FLONASE) 50 MCG/ACT nasal spray Place 2 sprays into the nose daily.  32 g  2  . omeprazole (PRILOSEC) 40 MG capsule Take 1 capsule (40 mg total) by mouth daily.  90 capsule  1  . [DISCONTINUED] polyethylene glycol (MIRALAX / GLYCOLAX) packet Take 17 g by mouth daily.       Facility-Administered Encounter Medications as of 04/12/2013  Medication Dose Route Frequency Provider Last Rate Last Dose  . ipratropium (ATROVENT) 0.02 % nebulizer solution 0.5 mg  0.5 mg Nebulization Once Hassan Rowan, MD              Objective:   Physical Exam  Constitutional: She is oriented to person, place, and time. She appears well-developed and well-nourished.  HENT:  Head: Normocephalic and atraumatic.  Cardiovascular: Normal rate, regular rhythm and normal heart sounds.   Pulmonary/Chest: Effort normal and breath sounds normal.  Abdominal: Soft. Bowel sounds are normal.  She exhibits no distension and no mass. There is tenderness. There is no rebound and no guarding.  Very tender over the epigastrium.  Neurological: She is alert and oriented to person, place, and time.  Skin: Skin is warm and dry.  Psychiatric: She has a normal mood and affect. Her behavior is normal.          Assessment & Plan:  Acute pancreatitis  - she actually feels a little bit worse today. She's not vomiting but still very nauseated. We'll give him Phenergan 50 mg IM injection today. For Zofran is helping but does seem to be giving her headache. Really push fluids as far as she can today to try to stay really hydrated. We will check her pancreatic enzymes today as well as a CBC. If she's not able to keep up with her fluids or starts vomiting then she will need to go to the emergency department and likely be admitted. There was no known cause. She has no history of very high triglyceride levels. She's had her gallbladder removed the stone is unlikely. She doesn't drink alchol.   For now continue liquid diet. We did discuss how to gradually advance diet as she is feeling better. Most importantly needs to work on hydration.  UTI- make sure complete antibiotic. If she feels like she can't keep it down then please let me know and we cannot give a Rocephin injection. Next  Constipation-chronic. I did encourage her to consider using a glycerin suppository of the next couple days if she doesn't have a bowel movement. He thinks with at least help with the bloating and maybe even the nausea. I want her to avoid MiraLax for now until the pancreatitis is improved.

## 2013-04-13 ENCOUNTER — Telehealth: Payer: Self-pay | Admitting: *Deleted

## 2013-04-13 LAB — CBC
HCT: 37.2 % (ref 36.0–46.0)
Hemoglobin: 12.1 g/dL (ref 12.0–15.0)
MCH: 24.4 pg — ABNORMAL LOW (ref 26.0–34.0)
MCV: 75.2 fL — ABNORMAL LOW (ref 78.0–100.0)
RBC: 4.95 MIL/uL (ref 3.87–5.11)

## 2013-04-13 NOTE — Telephone Encounter (Signed)
Pt told that she can advance slowly on her meals.Kristina Martinez

## 2013-04-14 ENCOUNTER — Encounter (HOSPITAL_COMMUNITY): Payer: Self-pay | Admitting: *Deleted

## 2013-04-14 ENCOUNTER — Inpatient Hospital Stay (HOSPITAL_BASED_OUTPATIENT_CLINIC_OR_DEPARTMENT_OTHER)
Admission: EM | Admit: 2013-04-14 | Discharge: 2013-04-16 | DRG: 440 | Disposition: A | Discharge status: Home / Self Care | Payer: BC Managed Care – PPO | Attending: Internal Medicine | Admitting: Internal Medicine

## 2013-04-14 DIAGNOSIS — M51379 Other intervertebral disc degeneration, lumbosacral region without mention of lumbar back pain or lower extremity pain: Secondary | ICD-10-CM

## 2013-04-14 DIAGNOSIS — N926 Irregular menstruation, unspecified: Secondary | ICD-10-CM

## 2013-04-14 DIAGNOSIS — F419 Anxiety disorder, unspecified: Secondary | ICD-10-CM

## 2013-04-14 DIAGNOSIS — R109 Unspecified abdominal pain: Secondary | ICD-10-CM

## 2013-04-14 DIAGNOSIS — E860 Dehydration: Secondary | ICD-10-CM

## 2013-04-14 DIAGNOSIS — R112 Nausea with vomiting, unspecified: Secondary | ICD-10-CM

## 2013-04-14 DIAGNOSIS — F988 Other specified behavioral and emotional disorders with onset usually occurring in childhood and adolescence: Secondary | ICD-10-CM

## 2013-04-14 DIAGNOSIS — M5137 Other intervertebral disc degeneration, lumbosacral region: Secondary | ICD-10-CM

## 2013-04-14 DIAGNOSIS — J329 Chronic sinusitis, unspecified: Secondary | ICD-10-CM

## 2013-04-14 DIAGNOSIS — F331 Major depressive disorder, recurrent, moderate: Secondary | ICD-10-CM

## 2013-04-14 DIAGNOSIS — F909 Attention-deficit hyperactivity disorder, unspecified type: Secondary | ICD-10-CM

## 2013-04-14 DIAGNOSIS — M7711 Lateral epicondylitis, right elbow: Secondary | ICD-10-CM

## 2013-04-14 DIAGNOSIS — K859 Acute pancreatitis without necrosis or infection, unspecified: Principal | ICD-10-CM

## 2013-04-14 DIAGNOSIS — M722 Plantar fascial fibromatosis: Secondary | ICD-10-CM

## 2013-04-14 DIAGNOSIS — R51 Headache: Secondary | ICD-10-CM

## 2013-04-14 DIAGNOSIS — K589 Irritable bowel syndrome without diarrhea: Secondary | ICD-10-CM | POA: Diagnosis present

## 2013-04-14 DIAGNOSIS — M5412 Radiculopathy, cervical region: Secondary | ICD-10-CM

## 2013-04-14 DIAGNOSIS — R748 Abnormal levels of other serum enzymes: Secondary | ICD-10-CM

## 2013-04-14 DIAGNOSIS — R011 Cardiac murmur, unspecified: Secondary | ICD-10-CM

## 2013-04-14 DIAGNOSIS — N83209 Unspecified ovarian cyst, unspecified side: Secondary | ICD-10-CM

## 2013-04-14 DIAGNOSIS — E785 Hyperlipidemia, unspecified: Secondary | ICD-10-CM | POA: Diagnosis present

## 2013-04-14 DIAGNOSIS — R61 Generalized hyperhidrosis: Secondary | ICD-10-CM

## 2013-04-14 DIAGNOSIS — I880 Nonspecific mesenteric lymphadenitis: Secondary | ICD-10-CM

## 2013-04-14 DIAGNOSIS — M549 Dorsalgia, unspecified: Secondary | ICD-10-CM

## 2013-04-14 DIAGNOSIS — K449 Diaphragmatic hernia without obstruction or gangrene: Secondary | ICD-10-CM

## 2013-04-14 HISTORY — DX: Migraine, unspecified, not intractable, without status migrainosus: G43.909

## 2013-04-14 HISTORY — DX: Syncope and collapse: R55

## 2013-04-14 LAB — CBC WITH DIFFERENTIAL/PLATELET
Basophils Absolute: 0.1 10*3/uL (ref 0.0–0.1)
Basophils Relative: 1 % (ref 0–1)
Eosinophils Absolute: 0.4 10*3/uL (ref 0.0–0.7)
HCT: 42.2 % (ref 36.0–46.0)
Hemoglobin: 13.9 g/dL (ref 12.0–15.0)
Lymphs Abs: 2.2 10*3/uL (ref 0.7–4.0)
MCH: 24.9 pg — ABNORMAL LOW (ref 26.0–34.0)
MCHC: 32.9 g/dL (ref 30.0–36.0)
MCV: 75.5 fL — ABNORMAL LOW (ref 78.0–100.0)
Monocytes Absolute: 0.7 10*3/uL (ref 0.1–1.0)
Monocytes Relative: 6 % (ref 3–12)
Neutro Abs: 8.3 10*3/uL — ABNORMAL HIGH (ref 1.7–7.7)
RDW: 16.8 % — ABNORMAL HIGH (ref 11.5–15.5)

## 2013-04-14 LAB — URINALYSIS, ROUTINE W REFLEX MICROSCOPIC
Ketones, ur: >80 mg/dL — AB
Nitrite: NEGATIVE
Protein, ur: NEGATIVE mg/dL
Urobilinogen, UA: 1 mg/dL (ref 0.0–1.0)
pH: 5.5 (ref 5.0–8.0)

## 2013-04-14 LAB — COMPREHENSIVE METABOLIC PANEL
Albumin: 4 g/dL (ref 3.5–5.2)
Alkaline Phosphatase: 170 U/L — ABNORMAL HIGH (ref 39–117)
BUN: 11 mg/dL (ref 6–23)
Calcium: 10.1 mg/dL (ref 8.4–10.5)
Chloride: 98 mEq/L (ref 96–112)
Creatinine, Ser: 0.8 mg/dL (ref 0.50–1.10)
GFR calc Af Amer: >90 mL/min (ref >90)
Glucose, Bld: 93 mg/dL (ref 70–99)
Total Bilirubin: 0.3 mg/dL (ref 0.3–1.2)

## 2013-04-14 LAB — BASIC METABOLIC PANEL
CO2: 21 mEq/L (ref 19–32)
Calcium: 8.7 mg/dL (ref 8.4–10.5)
Creatinine, Ser: 0.8 mg/dL (ref 0.50–1.10)
GFR calc Af Amer: >90 mL/min (ref >90)
GFR calc non Af Amer: 88 mL/min — ABNORMAL LOW (ref >90)
Glucose, Bld: 86 mg/dL (ref 70–99)
Sodium: 138 mEq/L (ref 135–145)

## 2013-04-14 LAB — URINE MICROSCOPIC-ADD ON

## 2013-04-14 LAB — CG4 I-STAT (LACTIC ACID): Lactic Acid, Venous: 0.95 mmol/L (ref 0.5–2.2)

## 2013-04-14 LAB — LIPASE, BLOOD: Lipase: 26 U/L (ref 11–59)

## 2013-04-14 MED ORDER — ACETAMINOPHEN 650 MG RE SUPP: 650.0000 mg | Freq: Four times a day (QID) | RECTAL | Status: DC | PRN

## 2013-04-14 MED ORDER — ONDANSETRON HCL 4 MG/2ML IJ SOLN: 4.0000 mg | Freq: Three times a day (TID) | INTRAMUSCULAR | Status: DC | PRN

## 2013-04-14 MED ORDER — ACETAMINOPHEN 325 MG PO TABS: 650.0000 mg | ORAL_TABLET | Freq: Four times a day (QID) | ORAL | Status: DC | PRN

## 2013-04-14 MED ORDER — ENOXAPARIN SODIUM 40 MG/0.4ML ~~LOC~~ SOLN
40.0000 mg | Freq: Every day | SUBCUTANEOUS | Status: DC
  Administered 2013-04-15: 40 mg via SUBCUTANEOUS
  Filled 2013-04-14 (×2): qty 0.4

## 2013-04-14 MED ORDER — HYDROMORPHONE HCL PF 1 MG/ML IJ SOLN
1.0000 mg | Freq: Once | INTRAMUSCULAR | Status: AC
  Administered 2013-04-14: 1 mg via INTRAVENOUS
  Filled 2013-04-14: qty 1

## 2013-04-14 MED ORDER — ONDANSETRON HCL 4 MG PO TABS: 4.0000 mg | ORAL_TABLET | Freq: Four times a day (QID) | ORAL | Status: DC | PRN

## 2013-04-14 MED ORDER — HYDROMORPHONE HCL PF 1 MG/ML IJ SOLN: 0.5000 mg | INTRAMUSCULAR | Status: DC | PRN

## 2013-04-14 MED ORDER — PROMETHAZINE HCL 25 MG/ML IJ SOLN
INTRAMUSCULAR | Status: AC
  Filled 2013-04-14: qty 1

## 2013-04-14 MED ORDER — SODIUM CHLORIDE 0.9 % IV BOLUS (SEPSIS)
1000.0000 mL | Freq: Once | INTRAVENOUS | Status: AC
  Administered 2013-04-14: 1000 mL via INTRAVENOUS

## 2013-04-14 MED ORDER — SODIUM CHLORIDE 0.9 % IV SOLN
INTRAVENOUS | Status: DC
  Administered 2013-04-15 – 2013-04-16 (×4): via INTRAVENOUS

## 2013-04-14 MED ORDER — ONDANSETRON HCL 4 MG/2ML IJ SOLN: 4.0000 mg | Freq: Four times a day (QID) | INTRAMUSCULAR | Status: DC | PRN

## 2013-04-14 MED ORDER — OXYCODONE HCL 5 MG PO TABS: 5.0000 mg | ORAL_TABLET | ORAL | Status: DC | PRN

## 2013-04-14 MED ORDER — SODIUM CHLORIDE 0.9 % IV SOLN: INTRAVENOUS | Status: AC

## 2013-04-14 MED ORDER — PROMETHAZINE HCL 25 MG/ML IJ SOLN
12.5000 mg | Freq: Once | INTRAMUSCULAR | Status: AC
  Administered 2013-04-14: 12.5 mg via INTRAVENOUS

## 2013-04-14 MED ORDER — PANTOPRAZOLE SODIUM 40 MG IV SOLR
40.0000 mg | Freq: Once | INTRAVENOUS | Status: AC
  Administered 2013-04-14: 40 mg via INTRAVENOUS
  Filled 2013-04-14: qty 40

## 2013-04-14 MED ORDER — HYDROMORPHONE HCL PF 1 MG/ML IJ SOLN: 1.0000 mg | INTRAMUSCULAR | Status: AC | PRN

## 2013-04-14 MED ORDER — ALUM & MAG HYDROXIDE-SIMETH 200-200-20 MG/5ML PO SUSP
30.0000 mL | Freq: Four times a day (QID) | ORAL | Status: DC | PRN
Start: 2013-04-14 — End: 2013-04-15

## 2013-04-14 MED ORDER — SODIUM CHLORIDE 0.9 % IV BOLUS (SEPSIS)
2000.0000 mL | Freq: Once | INTRAVENOUS | Status: AC
  Administered 2013-04-14: 2000 mL via INTRAVENOUS

## 2013-04-14 MED ORDER — ZOLPIDEM TARTRATE 5 MG PO TABS: 5.0000 mg | ORAL_TABLET | Freq: Every evening | ORAL | Status: DC | PRN

## 2013-04-14 MED ORDER — METOCLOPRAMIDE HCL 5 MG/ML IJ SOLN
10.0000 mg | Freq: Once | INTRAMUSCULAR | Status: AC
  Administered 2013-04-14: 10 mg via INTRAVENOUS
  Filled 2013-04-14: qty 2

## 2013-04-14 MED ORDER — PANTOPRAZOLE SODIUM 40 MG IV SOLR
40.0000 mg | Freq: Two times a day (BID) | INTRAVENOUS | Status: DC
  Administered 2013-04-15 (×3): 40 mg via INTRAVENOUS
  Filled 2013-04-14 (×5): qty 40

## 2013-04-14 MED ORDER — SODIUM CHLORIDE 0.9 % IV SOLN
INTRAVENOUS | Status: DC
  Administered 2013-04-14: 20:00:00 via INTRAVENOUS

## 2013-04-14 NOTE — ED Notes (Signed)
Pt seen here on the 14th for pancreatitis.  Pt denies ETOH.  Pt states she is not feeling better since discharge.  Pt having N/V/D.

## 2013-04-14 NOTE — ED Notes (Signed)
Dr Beaton at bedside 

## 2013-04-14 NOTE — ED Provider Notes (Signed)
Patient's anion gap improved after IV fluids.  Patient continued being nauseated and felt like she was going to vomit.  Patient be admitted for IV fluids and further evaluation.  Case was discussed with the hospitalist at [facility].  Nelia Shi, MD 04/14/13 [id number]

## 2013-04-14 NOTE — ED Notes (Signed)
Pt ambulatory to [facility]

## 2013-04-14 NOTE — ED Provider Notes (Signed)
CSN: [id number]     Arrival date & time 04/14/13  1414 History  This chart was scribed for Kristina Horn, MD by Danella Maiers, ED Scribe. This patient was seen in room MH09/MH09 and the patient's care was started at 3:15 PM.   Chief Complaint  Patient presents with  . Abdominal Pain  . Emesis   HPI HPI Comments: Kristina Martinez is a 42 y.o. female who presents to the Emergency Department complaining of gradual onset abdominal pain and nausea over the last several days worsening today. She was seen here Dx pancreatitis on 10/14 with mildly elevated lipase and mild pancreatic inflammation on CT. She had clear liquids for the past few days and felt better but not resolved then tried eating chicken broth and toast last night and ate today and pain and nausea got worse. No BMs for the past four days until she had diarrhea this morning, 4 episodes, no blood. She reports general weakness but no LOC. No confusion. She was placed on antibiotics Tuesday for possible UTI, slight dysuria today but no vaginal bleeding or discharge today. She reports getting a headache from taking Zofran. She has not taken dilaudid in the last few days. She is no fever no chest pain no shortness of breath no vomiting no trauma no rash.  Past Medical History  Diagnosis Date  . Obese   . ADD (attention deficit disorder)   . Depression   . IBS (irritable bowel syndrome)   . Ovarian cyst   . PMDD (premenstrual dysphoric disorder)   . Achilles tendon injury    Past Surgical History  Procedure Laterality Date  . Laparoscopic cholecystectomy  2011   Family History  Problem Relation Age of Onset  . Skin cancer Mother     skin  . Prostate cancer Father   . Heart disease Father     AMI  . Heart failure Father   . Stroke Brother 40  . Breast cancer Maternal Grandmother   . Uterine cancer Maternal Grandmother     ? cervix  . Breast cancer Paternal Grandmother    History  Substance Use Topics  . Smoking status: Never  Smoker   . Smokeless tobacco: Never Used  . Alcohol Use: Yes     Comment: 1/ in 6 months   OB History   Grav Para Term Preterm Abortions TAB SAB Ect Mult Living                 Review of Systems 10 Systems reviewed and are negative for acute change except as noted in the HPI. Allergies  Review of patient's allergies indicates no known allergies.  Home Medications   Current Outpatient Rx  Name  Route  Sig  Dispense  Refill  . buPROPion (WELLBUTRIN XL) 150 MG 24 hr tablet   Oral   Take 1 tablet (150 mg total) by mouth 2 (two) times daily.   180 tablet   0   . butalbital-acetaminophen-caffeine (FIORICET) 50-325-40 MG per tablet   Oral   Take 1 tablet by mouth 2 (two) times daily as needed for headache.   14 tablet   0   . EXPIRED: fluticasone (FLONASE) 50 MCG/ACT nasal spray   Nasal   Place 2 sprays into the nose daily.   32 g   2   . HYDROmorphone (DILAUDID) 4 MG tablet   Oral   Take 0.5-1 tablets (2-4 mg total) by mouth every 4 (four) hours as needed for pain.  30 tablet   0   . levonorgestrel-ethinyl estradiol (SEASONALE,INTROVALE,JOLESSA) 0.15-0.03 MG tablet   Oral   Take 1 tablet by mouth daily.   3 Package   3   . methylphenidate (RITALIN) 20 MG tablet   Oral   Take 1 tablet (20 mg total) by mouth daily.   30 tablet   0   . Multiple Vitamin (MULTIVITAMIN) tablet   Oral   Take 1 tablet by mouth daily.         . nitrofurantoin, macrocrystal-monohydrate, (MACROBID) 100 MG capsule   Oral   Take 1 capsule (100 mg total) by mouth 2 (two) times daily. X 7 days   14 capsule   0   . EXPIRED: omeprazole (PRILOSEC) 40 MG capsule   Oral   Take 1 capsule (40 mg total) by mouth daily.   90 capsule   1   . ondansetron (ZOFRAN ODT) 8 MG disintegrating tablet   Oral   Take 1 tablet (8 mg total) by mouth every 8 (eight) hours as needed for nausea.   20 tablet   0    BP 156/109  Pulse 111  Temp(Src) 97.6 F (36.4 C) (Oral)  Resp 16  Ht 5\' 7"   (1.702 m)  Wt 234 lb (106.142 kg)  BMI 36.64 kg/m2  SpO2 99%  LMP 03/11/2013 Physical Exam  Nursing note and vitals reviewed. Constitutional:  Awake, alert, nontoxic appearance.  HENT:  Head: Atraumatic.  Eyes: Right eye exhibits no discharge. Left eye exhibits no discharge.  Neck: Neck supple.  Cardiovascular: Normal rate and regular rhythm.   No murmur heard. Pulmonary/Chest: Effort normal and breath sounds normal. No respiratory distress. She has no wheezes. She has no rales. She exhibits no tenderness.  Abdominal: Soft. Bowel sounds are normal. She exhibits no distension and no mass. There is tenderness. There is no rebound and no guarding.  Moderate diffuse tenderness without rebound  Genitourinary:  No CVA tenderness or lumbar tenderness  Musculoskeletal: She exhibits no edema and no tenderness.  Baseline ROM, no obvious new focal weakness.  Neurological:  Mental status and motor strength appears baseline for patient and situation.  Skin: No rash noted.  Psychiatric: She has a normal mood and affect.    ED Course  Procedures (including critical care time) Medications  0.9 %  sodium chloride infusion (not administered)  sodium chloride 0.9 % bolus 2,000 mL (0 mLs Intravenous Stopped 04/14/13 1755)  HYDROmorphone (DILAUDID) injection 1 mg (1 mg Intravenous Given 04/14/13 1535)  metoCLOPramide (REGLAN) injection 10 mg (10 mg Intravenous Given 04/14/13 1535)  pantoprazole (PROTONIX) injection 40 mg (40 mg Intravenous Given 04/14/13 1534)  sodium chloride 0.9 % bolus 1,000 mL (1,000 mLs Intravenous New Bag/Given 04/14/13 1754)    DIAGNOSTIC STUDIES: Oxygen Saturation is 99% on RA, normal by my interpretation.    COORDINATION OF CARE: 3:21 PM- Discussed treatment plan with pt. Pt agrees to plan.Patient / Family / Caregiver understand and agree with initial ED impression and plan with expectations set for ED visit.  1750- Pt offered Obs elsewhere, prefers to stay in ED  with po fluid challenge, continue IVF, and repeat BMP at 1900 to recheck anion gap suspected elevated due to dehydration; clinically doubt sepsis. Suspect contaminated urine.   Labs Review Labs Reviewed  CBC WITH DIFFERENTIAL - Abnormal; Notable for the following:    WBC 11.7 (*)    RBC 5.59 (*)    MCV 75.5 (*)    MCH 24.9 (*)  RDW 16.8 (*)    Platelets 465 (*)    Neutro Abs 8.3 (*)    All other components within normal limits  COMPREHENSIVE METABOLIC PANEL - Abnormal; Notable for the following:    CO2 18 (*)    Total Protein 8.5 (*)    Alkaline Phosphatase 170 (*)    GFR calc non Af Amer 88 (*)    All other components within normal limits  URINALYSIS, ROUTINE W REFLEX MICROSCOPIC - Abnormal; Notable for the following:    Color, Urine AMBER (*)    APPearance CLOUDY (*)    Hgb urine dipstick SMALL (*)    Bilirubin Urine MODERATE (*)    Ketones, ur >80 (*)    Leukocytes, UA SMALL (*)    All other components within normal limits  URINE MICROSCOPIC-ADD ON - Abnormal; Notable for the following:    Squamous Epithelial / LPF MANY (*)    Bacteria, UA MANY (*)    Casts HYALINE CASTS (*)    Crystals CA OXALATE CRYSTALS (*)    All other components within normal limits  LIPASE, BLOOD  BASIC METABOLIC PANEL   Imaging Review No results found.  EKG Interpretation   None       MDM   1. Abdominal pain   2. Pancreatitis     Dispo pending; care endorsed to Dr. Radford Pax.     I personally performed the services described in this documentation, which was scribed in my presence. The recorded information has been reviewed and is accurate.    Kristina Horn, MD 04/14/13 [phone]

## 2013-04-14 NOTE — ED Notes (Signed)
Dr. Radford Pax states to continue NS IV @125  ml/hr s/p 3 Liters of NS infused.  Also, may remove cardiac monitor at this time.

## 2013-04-14 NOTE — H&P (Signed)
[facility] History and Physical  Kristina Martinez ZOX:[id number] DOB: [birth year] DOA: 04/14/2013  Referring physician:  EDP PCP: Nani Gasser, MD  Specialists:   Chief Complaint:  Nausea and Vomiting  HPI: Kristina Martinez is a 42 y.o. female who was seen at the [facility] ED due to continue nausea and vomiting and inability to hold down foods and liquids since she had been diagnosed with Acute pancreatitis  5 days ago.   She reports that she did follow up with her PCP 2 days ago and had labs that revealed improvement in her lipase level and her symptoms had been improving. The next day she was called and told by her PCP to advance her diet from clears to solid foods, but she could not hold down any foods or liquids and began to have increased vomiting nausea and epigastric ABD pain.   She was referred to the [facility] ED and from the sent for admission to [facility].   Her lipase level had decreased from 89 to 13 and then increased slightly to 26 today on re-check in the ED.      Review of Systems: The patient denies anorexia, fever, chills, headaches, weight loss, vision loss, diplopia, dizziness, decreased hearing, rhinitis, hoarseness, chest pain, syncope, dyspnea on exertion, peripheral edema, balance deficits, cough, hemoptysis, diarrhea, constipation, hematemesis, melena, hematochezia, severe indigestion/heartburn, dysuria, hematuria, incontinence, muscle weakness, suspicious skin lesions, transient blindness, difficulty walking, depression, unusual weight change, abnormal bleeding, enlarged lymph nodes, angioedema, and breast masses.    Past Medical History  Diagnosis Date  . Obese   . ADD (attention deficit disorder)   . Depression   . IBS (irritable bowel syndrome)   . Ovarian cyst   . PMDD (premenstrual dysphoric disorder)   . Achilles tendon injury     Past Surgical History  Procedure Laterality Date  . Laparoscopic cholecystectomy  2011    Prior to Admission  medications   Medication Sig Start Date End Date Taking? Authorizing Provider  buPROPion (WELLBUTRIN XL) 150 MG 24 hr tablet Take 1 tablet (150 mg total) by mouth 2 (two) times daily. 01/12/13   Agapito Games, MD  butalbital-acetaminophen-caffeine (FIORICET) [phone] MG per tablet Take 1 tablet by mouth 2 (two) times daily as needed for headache. 03/23/13   Agapito Games, MD  fluticasone [facility]) 50 MCG/ACT nasal spray Place 2 sprays into the nose daily. 10/07/11 10/06/12  Agapito Games, MD  HYDROmorphone (DILAUDID) 4 MG tablet Take 0.5-1 tablets (2-4 mg total) by mouth every 4 (four) hours as needed for pain. 04/10/13   Carlisle Beers Molpus, MD  levonorgestrel-ethinyl estradiol (SEASONALE,INTROVALE,JOLESSA) 0.15-0.03 MG tablet Take 1 tablet by mouth daily. 11/22/12   Agapito Games, MD  methylphenidate (RITALIN) 20 MG tablet Take 1 tablet (20 mg total) by mouth daily. 01/12/13 01/12/14  Agapito Games, MD  Multiple Vitamin (MULTIVITAMIN) tablet Take 1 tablet by mouth daily.    Historical Provider, MD  nitrofurantoin, macrocrystal-monohydrate, (MACROBID) 100 MG capsule Take 1 capsule (100 mg total) by mouth 2 (two) times daily. X 7 days 04/10/13   Carlisle Beers Molpus, MD  omeprazole (PRILOSEC) 40 MG capsule Take 1 capsule (40 mg total) by mouth daily. 10/07/11 04/10/13  Agapito Games, MD  ondansetron (ZOFRAN ODT) 8 MG disintegrating tablet Take 1 tablet (8 mg total) by mouth every 8 (eight) hours as needed for nausea. 04/10/13   Hanley Seamen, MD    No Known Allergies      Social  History:   Married   reports that she has never smoked. She has never used smokeless tobacco. She reports that she drinks alcohol. She reports that she does not use illicit drugs.     Family History  Problem Relation Age of Onset  . Skin cancer Mother     skin  . Prostate cancer Father   . Heart disease Father     AMI  . Heart failure Father   . Stroke Brother 40  . Breast cancer Maternal  Grandmother   . Uterine cancer Maternal Grandmother     ? cervix  . Breast cancer Paternal Grandmother      Physical Exam:  GEN: Pleasant Obese  42 y.o. Caucasian female  examined  and in no acute distress; cooperative with exam Filed Vitals:   04/14/13 1545 04/14/13 1638 04/14/13 2018 04/14/13 2246  BP: 110/69 153/80 135/90 141/84  Pulse:   79 75  Temp:   98.6 F (37 C) 98.1 F (36.7 C)  TempSrc:   Oral Oral  Resp: 11 10 13 15   Height:    5\' 7"  (1.702 m)  Weight:    105.5 kg (232 lb 9.4 oz)  SpO2: 95% 98% 99% 98%   Blood pressure 141/84, pulse 75, temperature 98.1 F (36.7 C), temperature source Oral, resp. rate 15, height 5\' 7"  (1.702 m), weight 105.5 kg (232 lb 9.4 oz), last menstrual period 03/11/2013, SpO2 98.00%. PSYCH: She is alert and oriented x4; does not appear anxious does not appear depressed; affect is normal HEENT: Normocephalic and Atraumatic, Mucous membranes pink; PERRLA; EOM intact; Fundi:  Benign;  No scleral icterus, Nares: Patent, Oropharynx: Clear, Fair Dentition, Neck:  FROM, no cervical lymphadenopathy nor thyromegaly or carotid bruit; no JVD; Breasts:: Not examined CHEST WALL: No tenderness CHEST: Normal respiration, clear to auscultation bilaterally HEART: Regular rate and rhythm; no murmurs rubs or gallops BACK: No kyphosis or scoliosis; no CVA tenderness ABDOMEN: Positive Bowel Sounds, Obese, soft non-tender; no masses, no organomegaly, no pannus; no intertriginous candida. Rectal Exam: Not done EXTREMITIES: No cyanosis, clubbing or edema; no ulcerations. Genitalia: not examined PULSES: 2+ and symmetric SKIN: Normal hydration no rash or ulceration CNS: Cranial nerves 2-12 grossly intact no focal neurologic deficit    Labs on Admission:  Basic Metabolic Panel:  Recent Labs Lab 04/10/13 0415 04/14/13 1515 04/14/13 1910  NA 137 136 138  K 3.7 3.8 4.1  CL 101 98 105  CO2 24 18* 21  GLUCOSE 125* 93 86  BUN 8 11 10   CREATININE 0.70 0.80  0.80  CALCIUM 9.5 10.1 8.7   Liver Function Tests:  Recent Labs Lab 04/10/13 0415 04/14/13 1515  AST 14 19  ALT 12 18  ALKPHOS 125* 170*  BILITOT 0.3 0.3  PROT 7.3 8.5*  ALBUMIN 3.3* 4.0    Recent Labs Lab 04/10/13 0415 04/12/13 1040 04/14/13 1515  LIPASE 89* 13 26  AMYLASE  --  27  --    No results found for this basename: AMMONIA,  in the last 168 hours CBC:  Recent Labs Lab 04/10/13 0415 04/12/13 1040 04/14/13 1515  WBC 11.4* 10.9* 11.7*  NEUTROABS 7.6  --  8.3*  HGB 11.7* 12.1 13.9  HCT 36.1 37.2 42.2  MCV 77.0* 75.2* 75.5*  PLT 395 462* 465*   Cardiac Enzymes: No results found for this basename: CKTOTAL, CKMB, CKMBINDEX, TROPONINI,  in the last 168 hours  BNP (last 3 results) No results found for this basename: PROBNP,  in the  last 8760 hours CBG: No results found for this basename: GLUCAP,  in the last 168 hours  Radiological Exams on Admission: No results found.   Assessment/Plan Principal Problem:   Nausea and vomiting Active Problems:   Dehydration   Pancreatitis, acute   ADD    1.  Nausea and Vomiting - due to resolving Acute Pancreatitis,  PRN IV phenergan and IVFs, and IV Protonix q 12 hours, Monitor Lipase levels, and advance diet as tolerated.    2.  Dehydration- due to #1.    3.  Acute Pancreatitis- Lipase level has improved , monitor trend.    4.   ADD/ADHD- Continue ritalin Rx.    5.  DVT prophylaxis with Lovenox.        Code Status:    FULL CODE      Family Communication:  Husband at Bedside    Disposition Plan:  Inpatient        Time spent:  60 minutes  Ron Flener [facility] Pager [phone]  If 7PM-7AM, please contact night-coverage [email or web address] Password [email or web address] 04/14/2013, 11:26 PM

## 2013-04-14 NOTE — Plan of Care (Signed)
42 yo F with acute pancreatitis a couple of days ago, but still had n/v when she tried to restart POs over the weekend, in for obs and IVF, nausea control.

## 2013-04-14 NOTE — ED Notes (Signed)
Pt c/o nausea, Dr. Radford Pax informed.  Order received.  [facility] here and will assume care of the patient.

## 2013-04-15 ENCOUNTER — Encounter (HOSPITAL_COMMUNITY): Payer: Self-pay | Admitting: Internal Medicine

## 2013-04-15 DIAGNOSIS — R109 Unspecified abdominal pain: Secondary | ICD-10-CM

## 2013-04-15 DIAGNOSIS — K859 Acute pancreatitis without necrosis or infection, unspecified: Principal | ICD-10-CM

## 2013-04-15 DIAGNOSIS — R112 Nausea with vomiting, unspecified: Secondary | ICD-10-CM

## 2013-04-15 DIAGNOSIS — E860 Dehydration: Secondary | ICD-10-CM

## 2013-04-15 DIAGNOSIS — F988 Other specified behavioral and emotional disorders with onset usually occurring in childhood and adolescence: Secondary | ICD-10-CM

## 2013-04-15 LAB — BASIC METABOLIC PANEL
BUN: 6 mg/dL (ref 6–23)
CO2: 17 mEq/L — ABNORMAL LOW (ref 19–32)
Chloride: 105 mEq/L (ref 96–112)
Creatinine, Ser: 0.7 mg/dL (ref 0.50–1.10)
GFR calc Af Amer: >90 mL/min (ref >90)
GFR calc non Af Amer: >90 mL/min (ref >90)
Glucose, Bld: 74 mg/dL (ref 70–99)
Potassium: 3.8 mEq/L (ref 3.5–5.1)
Sodium: 138 mEq/L (ref 135–145)

## 2013-04-15 LAB — CBC
HCT: 33.3 % — ABNORMAL LOW (ref 36.0–46.0)
Hemoglobin: 10.8 g/dL — ABNORMAL LOW (ref 12.0–15.0)
MCH: 24.8 pg — ABNORMAL LOW (ref 26.0–34.0)
MCHC: 32.4 g/dL (ref 30.0–36.0)
MCV: 76.4 fL — ABNORMAL LOW (ref 78.0–100.0)
RBC: 4.36 MIL/uL (ref 3.87–5.11)

## 2013-04-15 LAB — LIPASE, BLOOD: Lipase: 20 U/L (ref 11–59)

## 2013-04-15 MED ORDER — PROMETHAZINE HCL 25 MG PO TABS: 25.0000 mg | ORAL_TABLET | Freq: Four times a day (QID) | ORAL | Status: DC | PRN

## 2013-04-15 MED ORDER — BUPROPION HCL ER (XL) 150 MG PO TB24
150.0000 mg | ORAL_TABLET | Freq: Two times a day (BID) | ORAL | Status: DC
  Administered 2013-04-15 (×3): 150 mg via ORAL
  Filled 2013-04-15 (×5): qty 1

## 2013-04-15 MED ORDER — FLUTICASONE PROPIONATE 50 MCG/ACT NA SUSP
2.0000 | Freq: Every day | NASAL | Status: DC
  Filled 2013-04-15: qty 16

## 2013-04-15 MED ORDER — METHYLPHENIDATE HCL 5 MG PO TABS: 20.0000 mg | ORAL_TABLET | Freq: Every day | ORAL | Status: DC

## 2013-04-15 MED ORDER — NITROFURANTOIN MONOHYD MACRO 100 MG PO CAPS
100.0000 mg | ORAL_CAPSULE | Freq: Two times a day (BID) | ORAL | Status: DC
  Administered 2013-04-15 (×3): 100 mg via ORAL
  Filled 2013-04-15 (×5): qty 1

## 2013-04-15 MED ORDER — BUTALBITAL-APAP-CAFFEINE 50-325-40 MG PO TABS
1.0000 | ORAL_TABLET | Freq: Two times a day (BID) | ORAL | Status: DC | PRN
  Administered 2013-04-15: 1 via ORAL
  Filled 2013-04-15: qty 1

## 2013-04-15 MED ORDER — LEVONORGEST-ETH ESTRAD 91-DAY 0.15-0.03 MG PO TABS: 1.0000 | ORAL_TABLET | Freq: Every day | ORAL | Status: DC

## 2013-04-15 MED ORDER — PROMETHAZINE HCL 25 MG RE SUPP: 12.5000 mg | Freq: Four times a day (QID) | RECTAL | Status: DC | PRN

## 2013-04-15 MED ORDER — PROMETHAZINE HCL 25 MG/ML IJ SOLN: 12.5000 mg | Freq: Four times a day (QID) | INTRAMUSCULAR | Status: DC | PRN

## 2013-04-15 MED ORDER — BUTALBITAL-APAP-CAFFEINE 50-325-40 MG PO TABS: 1.0000 | ORAL_TABLET | Freq: Two times a day (BID) | ORAL | Status: DC | PRN

## 2013-04-15 NOTE — Progress Notes (Signed)
Patient admitted by Dr. Lovell Sheehan this AM.  Please see H&p.  Mild case of pancreatitis.  Clears- advance as tolerated.  GB out, check lipid panel  Marlin Canary DO

## 2013-04-16 DIAGNOSIS — E785 Hyperlipidemia, unspecified: Secondary | ICD-10-CM

## 2013-04-16 LAB — COMPREHENSIVE METABOLIC PANEL
ALT: 13 U/L (ref 0–35)
AST: 12 U/L (ref 0–37)
Alkaline Phosphatase: 112 U/L (ref 39–117)
BUN: 4 mg/dL — ABNORMAL LOW (ref 6–23)
CO2: 23 mEq/L (ref 19–32)
Calcium: 8.6 mg/dL (ref 8.4–10.5)
Chloride: 103 mEq/L (ref 96–112)
GFR calc Af Amer: >90 mL/min (ref >90)
GFR calc non Af Amer: >90 mL/min (ref >90)
Glucose, Bld: 104 mg/dL — ABNORMAL HIGH (ref 70–99)
Sodium: 137 mEq/L (ref 135–145)
Total Bilirubin: 0.2 mg/dL — ABNORMAL LOW (ref 0.3–1.2)
Total Protein: 6.1 g/dL (ref 6.0–8.3)

## 2013-04-16 LAB — CBC
HCT: 31.4 % — ABNORMAL LOW (ref 36.0–46.0)
Hemoglobin: 10.4 g/dL — ABNORMAL LOW (ref 12.0–15.0)
MCH: 25.1 pg — ABNORMAL LOW (ref 26.0–34.0)
MCHC: 33.1 g/dL (ref 30.0–36.0)
RBC: 4.15 MIL/uL (ref 3.87–5.11)

## 2013-04-16 LAB — LIPID PANEL
Cholesterol: 213 mg/dL — ABNORMAL HIGH (ref 0–200)
HDL: 27 mg/dL — ABNORMAL LOW (ref >39)
Total CHOL/HDL Ratio: 7.9 RATIO
Triglycerides: 167 mg/dL — ABNORMAL HIGH (ref <150)
VLDL: 33 mg/dL (ref 0–40)

## 2013-04-16 MED ORDER — PROMETHAZINE HCL 25 MG PO TABS: 25.0000 mg | ORAL_TABLET | Freq: Four times a day (QID) | ORAL | Status: DC | PRN

## 2013-04-16 NOTE — Discharge Summary (Signed)
Physician Discharge Summary  Kristina Martinez HCW:[id number] DOB: 02-May-1971 DOA: 04/14/2013  PCP: Kristina Gasser, MD  Admit date: 04/14/2013 Discharge date: 04/16/2013  Time spent: 35 minutes  Recommendations for Outpatient Follow-up:  1. LDL high- recheck after lifestyle changes  Discharge Diagnoses:  Principal Problem:   Nausea and vomiting Active Problems:   ADD   Dehydration   Pancreatitis, acute   HLD (hyperlipidemia)   Discharge Condition: improved  Diet recommendation: low Na bland  Filed Weights   04/14/13 1427 04/14/13 2246  Weight: 106.142 kg (234 lb) 105.5 kg (232 lb 9.4 oz)    History of present illness:  Kristina Martinez is a 42 y.o. female who was seen at the [facility] ED due to continue nausea and vomiting and inability to hold down foods and liquids since she had been diagnosed with Acute pancreatitis 5 days ago. She reports that she did follow up with her PCP 2 days ago and had labs that revealed improvement in her lipase level and her symptoms had been improving. The next day she was called and told by her PCP to advance her diet from clears to solid foods, but she could not hold down any foods or liquids and began to have increased vomiting nausea and epigastric ABD pain. She was referred to the [facility] ED and from the sent for admission to [facility]. Her lipase level had decreased from 89 to 13 and then increased slightly to 26 today on re-check in the ED   Hospital Course:  Nausea and Vomiting - due to resolving Acute Pancreatitis, resolved, eating well Dehydration- due to #1.  Acute Pancreatitis- Lipase level has improved , resolved  ADD/ADHD- Continue ritalin Rx.   Procedures:  none  Consultations:  none  Discharge Exam: Filed Vitals:   04/16/13 0601  BP: 138/78  Pulse: 91  Temp: 97.9 F (36.6 C)  Resp: 19    General: A+Ox3, NAD Cardiovascular: rrr Respiratory: clear anterior  Discharge Instructions  Discharge Orders   Future Orders Complete By Expires   Discharge instructions  As directed    Comments:     Bland diet, eat as tolerated- avoid alcohol   Increase activity slowly  As directed        Medication List    STOP taking these medications       nitrofurantoin (macrocrystal-monohydrate) 100 MG capsule  Commonly known as:  MACROBID      TAKE these medications       buPROPion 150 MG 24 hr tablet  Commonly known as:  WELLBUTRIN XL  Take 150 mg by mouth daily.     butalbital-acetaminophen-caffeine 50-325-40 MG per tablet  Commonly known as:  FIORICET, ESGIC  Take 1 tablet by mouth 2 (two) times daily as needed for headache.     cetirizine 10 MG tablet  Commonly known as:  ZYRTEC  Take 10 mg by mouth daily as needed for allergies.     fluticasone 50 MCG/ACT nasal spray  Commonly known as:  FLONASE  Place 2 sprays into the nose daily as needed for rhinitis.     HYDROmorphone 4 MG tablet  Commonly known as:  DILAUDID  Take 2-4 mg by mouth every 4 (four) hours as needed for pain.     levonorgestrel-ethinyl estradiol 0.15-0.03 MG tablet  Commonly known as:  SEASONALE,INTROVALE,JOLESSA  Take 1 tablet by mouth daily.     methylphenidate 20 MG tablet  Commonly known as:  RITALIN  Take 20 mg by mouth daily as needed (  for additional concentration).     omeprazole 20 MG capsule  Commonly known as:  PRILOSEC  Take 20 mg by mouth daily.     promethazine 25 MG tablet  Commonly known as:  PHENERGAN  Take 1 tablet (25 mg total) by mouth every 6 (six) hours as needed.       Allergies  Allergen Reactions  . Zofran [Ondansetron Hcl]     Migraine       The results of significant diagnostics from this hospitalization (including imaging, microbiology, ancillary and laboratory) are listed below for reference.    Significant Diagnostic Studies: Ct Abdomen Pelvis W Contrast  04/10/2013   CLINICAL DATA:  Elevated lipase.  EXAM: CT ABDOMEN AND PELVIS WITH CONTRAST  TECHNIQUE:  Multidetector CT imaging of the abdomen and pelvis was performed using the standard protocol following bolus administration of intravenous contrast.  CONTRAST:  OMNIPAQUE IOHEXOL 300 MG/ML  SOLN  COMPARISON:  01/28/2010  FINDINGS: BODY WALL: Fat containing umbilical hernia.  LOWER CHEST:  Mediastinum: Moderate sliding-type hiatal hernia.  Lungs/pleura: No consolidation.  ABDOMEN/PELVIS:  Liver: No focal abnormality.  Biliary: Cholecystectomy.  Pancreas: Minimal fat stranding around the uncinate process. No fluid collection. No ductal enlargement.  Spleen: Unremarkable.  Adrenals: Unremarkable.  Kidneys and ureters: No hydronephrosis or stone.  Bladder: Unremarkable.  Bowel: No obstruction. Normal appendix. Moderate number of colonic diverticula.  Retroperitoneum: No mass or adenopathy.  Peritoneum: No free fluid or gas.  Reproductive: Unremarkable.  Vascular: No acute abnormality.  OSSEOUS: L5 pars defects with focally advanced degenerative disc disease and mild anterolisthesis. No suspicious lytic or blastic lesions.  IMPRESSION: 1. Subtle inflammatory changes around the pancreatic uncinate, correlating with history of pancreatitis. No fluid collection or necrosis. 2. Cholecystectomy. 3. Hiatal hernia. 4. Colonic diverticulosis.   Electronically Signed   By: Tiburcio Pea M.D.   On: 04/10/2013 05:56    Microbiology: Recent Results (from the past 240 hour(s))  URINE CULTURE     Status: None   Collection Time    04/10/13  5:00 AM      Result Value Range Status   Specimen Description URINE, RANDOM   Final   Special Requests NONE   Final   Culture  Setup Time     Final   Value: 04/10/2013 09:38     Performed at [facility] Count     Final   Value: >=100,000 COLONIES/ML     Performed at [facility]   Culture     Final   Value: LACTOBACILLUS SPECIES     Note: Standardized susceptibility testing for this organism is not available.     Performed at [facility]    Report Status 04/11/2013 FINAL   Final     Labs: Basic Metabolic Panel:  Recent Labs Lab 04/10/13 0415 04/14/13 1515 04/14/13 1910 04/15/13 0535 04/16/13 0550  NA 137 136 138 138 137  K 3.7 3.8 4.1 3.8 3.5  CL 101 98 105 105 103  CO2 24 18* 21 17* 23  GLUCOSE 125* 93 86 74 104*  BUN 8 11 10 6  4*  CREATININE 0.70 0.80 0.80 0.70 0.69  CALCIUM 9.5 10.1 8.7 8.4 8.6   Liver Function Tests:  Recent Labs Lab 04/10/13 0415 04/14/13 1515 04/16/13 0550  AST 14 19 12   ALT 12 18 13   ALKPHOS 125* 170* 112  BILITOT 0.3 0.3 0.2*  PROT 7.3 8.5* 6.1  ALBUMIN 3.3* 4.0 2.8*    Recent  Labs Lab 04/10/13 0415 04/12/13 1040 04/14/13 1515 04/15/13 0535 04/16/13 0550  LIPASE 89* 13 26 20 21   AMYLASE  --  27  --   --   --    No results found for this basename: AMMONIA,  in the last 168 hours CBC:  Recent Labs Lab 04/10/13 0415 04/12/13 1040 04/14/13 1515 04/15/13 0535 04/16/13 0550  WBC 11.4* 10.9* 11.7* 7.6 5.6  NEUTROABS 7.6  --  8.3*  --   --   HGB 11.7* 12.1 13.9 10.8* 10.4*  HCT 36.1 37.2 42.2 33.3* 31.4*  MCV 77.0* 75.2* 75.5* 76.4* 75.7*  PLT 395 462* 465* 325 293   Cardiac Enzymes: No results found for this basename: CKTOTAL, CKMB, CKMBINDEX, TROPONINI,  in the last 168 hours BNP: BNP (last 3 results) No results found for this basename: PROBNP,  in the last 8760 hours CBG: No results found for this basename: GLUCAP,  in the last 168 hours     Signed:  Marlin Canary  [facility] 04/16/2013, 8:13 AM

## 2013-04-16 NOTE — Progress Notes (Signed)
DC home with husband, verbally understood DC instructions, no questions ask 

## 2013-04-24 ENCOUNTER — Encounter: Payer: Self-pay | Admitting: Family Medicine

## 2013-04-24 ENCOUNTER — Other Ambulatory Visit: Payer: Self-pay | Admitting: Family Medicine

## 2013-04-24 ENCOUNTER — Ambulatory Visit (INDEPENDENT_AMBULATORY_CARE_PROVIDER_SITE_OTHER): Payer: BC Managed Care – PPO | Admitting: Family Medicine

## 2013-04-24 VITALS — BP 132/81 | HR 94 | Wt 232.0 lb

## 2013-04-24 DIAGNOSIS — R1013 Epigastric pain: Secondary | ICD-10-CM

## 2013-04-24 DIAGNOSIS — Z23 Encounter for immunization: Secondary | ICD-10-CM

## 2013-04-24 DIAGNOSIS — K859 Acute pancreatitis without necrosis or infection, unspecified: Secondary | ICD-10-CM

## 2013-04-24 NOTE — Progress Notes (Signed)
  Subjective:    Patient ID: Kristina Martinez, female    DOB: 1970-12-18, 42 y.o.   MRN: [id number]  HPI F/u pancreatitis - She is much better. Was was admitted about 2 days after I had seen her.  She has felt a raw nawing feeling in her epigatric pain.  Feels a little worse after she eats.  Taking prilosec 20mg s.  No fever, chills, or sweats. Bowels are back to normal.   Review of Systems     Objective:   Physical Exam  Constitutional: She is oriented to person, place, and time. She appears well-developed and well-nourished.  HENT:  Head: Normocephalic and atraumatic.  Cardiovascular: Normal rate, regular rhythm and normal heart sounds.   Pulmonary/Chest: Effort normal and breath sounds normal.  Abdominal: Soft. Bowel sounds are normal. She exhibits no distension and no mass. There is tenderness. There is no rebound and no guarding.  TTP in the epigastric area, left upper quadrant, right lower quadrant, left lower quad. In mildly tender around the umbilicus.  Neurological: She is alert and oriented to person, place, and time.  Skin: Skin is warm and dry.  Psychiatric: She has a normal mood and affect. Her behavior is normal.          Assessment & Plan:  Acute pancreatitis - overall she significantly improved his eating a regular diet and says she's trying to stay hydrated. The she has had some epigastric discomfort the last couple of days. We'll recheck her enzymes just to make sure that they're not elevating and check a CBC with differential and liver enzymes as well. Encourage her to continue with a bland diet and make sure staying well hydrated. Call if she's not improving after increasing her PPI to twice a day over the next 3-4 days.

## 2013-04-25 LAB — FOLATE: Folate: 7 ng/mL

## 2013-04-25 LAB — COMPLETE METABOLIC PANEL WITH GFR
ALT: 27 U/L (ref 0–35)
Albumin: 3.9 g/dL (ref 3.5–5.2)
Alkaline Phosphatase: 117 U/L (ref 39–117)
BUN: 10 mg/dL (ref 6–23)
Calcium: 9.3 mg/dL (ref 8.4–10.5)
Creat: 0.7 mg/dL (ref 0.50–1.10)
GFR, Est Non African American: >89 mL/min
Glucose, Bld: 112 mg/dL — ABNORMAL HIGH (ref 70–99)
Potassium: 4 mEq/L (ref 3.5–5.3)
Sodium: 137 mEq/L (ref 135–145)
Total Bilirubin: 0.4 mg/dL (ref 0.3–1.2)

## 2013-04-25 LAB — CBC WITH DIFFERENTIAL/PLATELET
Basophils Absolute: 0.1 10*3/uL (ref 0.0–0.1)
Basophils Relative: 1 % (ref 0–1)
Eosinophils Absolute: 0.4 10*3/uL (ref 0.0–0.7)
Eosinophils Relative: 5 % (ref 0–5)
HCT: 36.1 % (ref 36.0–46.0)
Hemoglobin: 11.6 g/dL — ABNORMAL LOW (ref 12.0–15.0)
MCH: 24.2 pg — ABNORMAL LOW (ref 26.0–34.0)
MCHC: 32.1 g/dL (ref 30.0–36.0)
MCV: 75.2 fL — ABNORMAL LOW (ref 78.0–100.0)
Monocytes Absolute: 0.5 10*3/uL (ref 0.1–1.0)
Monocytes Relative: 6 % (ref 3–12)
Neutro Abs: 4.8 10*3/uL (ref 1.7–7.7)
RDW: 17.1 % — ABNORMAL HIGH (ref 11.5–15.5)

## 2013-04-25 LAB — LIPASE: Lipase: 11 U/L (ref 0–75)

## 2013-04-25 LAB — VITAMIN B12: Vitamin B-12: 592 pg/mL (ref 211–911)

## 2013-06-04 ENCOUNTER — Other Ambulatory Visit: Payer: Self-pay | Admitting: *Deleted

## 2013-06-04 MED ORDER — METHYLPHENIDATE HCL 20 MG PO TABS: 20.0000 mg | ORAL_TABLET | Freq: Every day | ORAL | Status: DC | PRN

## 2013-06-05 ENCOUNTER — Encounter: Payer: Self-pay | Admitting: Sports Medicine

## 2013-06-05 ENCOUNTER — Ambulatory Visit (INDEPENDENT_AMBULATORY_CARE_PROVIDER_SITE_OTHER): Payer: BC Managed Care – PPO | Admitting: Sports Medicine

## 2013-06-05 VITALS — BP 133/76 | HR 90 | Wt 232.0 lb

## 2013-06-05 DIAGNOSIS — G5620 Lesion of ulnar nerve, unspecified upper limb: Secondary | ICD-10-CM

## 2013-06-05 DIAGNOSIS — G5621 Lesion of ulnar nerve, right upper limb: Secondary | ICD-10-CM | POA: Insufficient documentation

## 2013-06-05 NOTE — Assessment & Plan Note (Signed)
I think this does represent a mild neuropraxia. She does have weakness of the hand intrinsics as well as pain into the fourth and fifth fingers. Velcro wrist brace. Mobic for 2 weeks. Return to see me in 3 weeks.

## 2013-06-05 NOTE — Progress Notes (Addendum)
  Subjective:    CC: Wrist pain  HPI: Kristina Martinez this pleasant 42 year old female reached for the refrigerator, as she grasp it she felt a sharp pain in her wrist reading into the fourth and fifth fingers. Since then she's had weakness with some numbness and tingling into the fourth and fifth fingers. Overall is better than yesterday, but fairly persistent and moderate. No pain in the neck, no pain at the cubital tunnel.  Past medical history, Surgical history, Family history not pertinant except as noted below, Social history, Allergies, and medications have been entered into the medical record, reviewed, and no changes needed.   Review of Systems: No fevers, chills, night sweats, weight loss, chest pain, or shortness of breath.   Objective:    General: Well Developed, well nourished, and in no acute distress.  Neuro: Alert and oriented x3, extra-ocular muscles intact, sensation grossly intact.  HEENT: Normocephalic, atraumatic, pupils equal round reactive to light, neck supple, no masses, no lymphadenopathy, thyroid nonpalpable.  Skin: Warm and dry, no rashes. Cardiac: Regular rate and rhythm, no murmurs rubs or gallops, no lower extremity edema.  Respiratory: Clear to auscultation bilaterally. Not using accessory muscles, speaking in full sentences. Right hand and wrist: No discrete areas of tenderness to palpation, no thenar or hyperthenar atrophy, no tenderness to palpation of the cubital tunnel. She has excellent strength of the thenar muscles, she does have weakness to flexion at the metacarpophalangeal joints of the fourth and fifth digits and weakness of the hand intrinsics, particularly adduction/palmar interossei. Negative Tinel sign over the median nerve and Guyon's canal.  Impression and Recommendations:

## 2013-06-18 ENCOUNTER — Other Ambulatory Visit: Payer: Self-pay | Admitting: *Deleted

## 2013-06-18 MED ORDER — BUPROPION HCL ER (XL) 150 MG PO TB24: 150.0000 mg | ORAL_TABLET | Freq: Every day | ORAL | Status: DC

## 2013-07-17 ENCOUNTER — Other Ambulatory Visit: Payer: Self-pay | Admitting: Family Medicine

## 2013-07-17 MED ORDER — METHYLPHENIDATE HCL 20 MG PO TABS: 20.0000 mg | ORAL_TABLET | Freq: Every day | ORAL | Status: DC | PRN

## 2013-08-19 ENCOUNTER — Other Ambulatory Visit: Payer: Self-pay | Admitting: Family Medicine

## 2013-08-19 ENCOUNTER — Encounter: Payer: Self-pay | Admitting: Family Medicine

## 2013-08-20 ENCOUNTER — Other Ambulatory Visit: Payer: Self-pay | Admitting: *Deleted

## 2013-08-20 ENCOUNTER — Other Ambulatory Visit: Payer: Self-pay | Admitting: Family Medicine

## 2013-08-20 ENCOUNTER — Telehealth: Payer: Self-pay | Admitting: *Deleted

## 2013-08-20 MED ORDER — ALPRAZOLAM 0.5 MG PO TABS: 0.2500 mg | ORAL_TABLET | Freq: Every day | ORAL | Status: DC | PRN

## 2013-08-20 MED ORDER — BUPROPION HCL ER (XL) 150 MG PO TB24: 150.0000 mg | ORAL_TABLET | Freq: Two times a day (BID) | ORAL | Status: DC

## 2013-08-20 MED ORDER — METHYLPHENIDATE HCL 20 MG PO TABS: 20.0000 mg | ORAL_TABLET | Freq: Every day | ORAL | Status: DC | PRN

## 2013-08-20 NOTE — Telephone Encounter (Signed)
Pt states she felt better when she was taking Bupropion 150mg  BID. The last rx sent in to [facility] was for 1 tab daily but the rx before that one was 1 tab BID. Please advise how this medication can be sent.  Oscar La, LPN

## 2013-08-20 NOTE — Telephone Encounter (Signed)
Pt states that she felt better when she was taking 300

## 2013-08-20 NOTE — Telephone Encounter (Signed)
It looks like she was on BID. Not sure why changed. Ok to refill for BID

## 2013-09-14 ENCOUNTER — Encounter: Payer: Self-pay | Admitting: Emergency Medicine

## 2013-09-14 ENCOUNTER — Emergency Department
Admission: EM | Admit: 2013-09-14 | Discharge: 2013-09-14 | Disposition: A | Discharge status: Home / Self Care | Payer: BC Managed Care – PPO | Source: Home / Self Care

## 2013-09-14 DIAGNOSIS — L738 Other specified follicular disorders: Secondary | ICD-10-CM

## 2013-09-14 DIAGNOSIS — L678 Other hair color and hair shaft abnormalities: Secondary | ICD-10-CM

## 2013-09-14 DIAGNOSIS — L739 Follicular disorder, unspecified: Secondary | ICD-10-CM

## 2013-09-14 MED ORDER — DOXYCYCLINE HYCLATE 50 MG PO CAPS: 50.0000 mg | ORAL_CAPSULE | Freq: Two times a day (BID) | ORAL | Status: DC

## 2013-09-14 NOTE — Discharge Instructions (Signed)
Minimize sun exposure while taking doxycycline.   Folliculitis  Folliculitis is redness, soreness, and swelling (inflammation) of the hair follicles. This condition can occur anywhere on the body. People with weakened immune systems, diabetes, or obesity have a greater risk of getting folliculitis. CAUSES  Bacterial infection. This is the most common cause.  Fungal infection.  Viral infection.  Contact with certain chemicals, especially oils and tars. Long-term folliculitis can result from bacteria that live in the nostrils. The bacteria may trigger multiple outbreaks of folliculitis over time. SYMPTOMS Folliculitis most commonly occurs on the scalp, thighs, legs, back, buttocks, and areas where hair is shaved frequently. An early sign of folliculitis is a small, white or yellow, pus-filled, itchy lesion (pustule). These lesions appear on a red, inflamed follicle. They are usually less than 0.2 inches (5 mm) wide. When there is an infection of the follicle that goes deeper, it becomes a boil or furuncle. A group of closely packed boils creates a larger lesion (carbuncle). Carbuncles tend to occur in hairy, sweaty areas of the body. DIAGNOSIS  Your caregiver can usually tell what is wrong by doing a physical exam. A sample may be taken from one of the lesions and tested in a lab. This can help determine what is causing your folliculitis. TREATMENT  Treatment may include:  Applying warm compresses to the affected areas.  Taking antibiotic medicines orally or applying them to the skin.  Draining the lesions if they contain a large amount of pus or fluid.  Laser hair removal for cases of long-lasting folliculitis. This helps to prevent regrowth of the hair. HOME CARE INSTRUCTIONS  Apply warm compresses to the affected areas as directed by your caregiver.  If antibiotics are prescribed, take them as directed. Finish them even if you start to feel better.  You may take over-the-counter  medicines to relieve itching.  Do not shave irritated skin.  Follow up with your caregiver as directed. SEEK IMMEDIATE MEDICAL CARE IF:   You have increasing redness, swelling, or pain in the affected area.  You have a fever. MAKE SURE YOU:  Understand these instructions.  Will watch your condition.  Will get help right away if you are not doing well or get worse. Document Released: 08/23/2001 Document Revised: 12/14/2011 Document Reviewed: 09/14/2011 [facility] Patient Information 2014 [place], [place].

## 2013-09-14 NOTE — ED Provider Notes (Signed)
CSN: [id number]     Arrival date & time 09/14/13  1848 History   None    Chief Complaint  Patient presents with  . Rash      HPI Comments: Patient complains of a chronic rash on her face, primarily on the right, for about 3 months and now worse for about 3 weeks.  Over the past several days she has developed rash behind her right ear.  She feels well otherwise.  No oral lesions.  She denies use of topical steroids.  Patient is a 43 y.o. female presenting with rash. The history is provided by the patient.  Rash Pain location: face. Pain quality comment:  Itching Pain severity:  Mild Onset quality:  Gradual Duration:  3 days Timing:  Constant Progression:  Worsening Chronicity:  Chronic Relieved by:  Nothing Worsened by:  Nothing tried Associated symptoms: no chills, no fatigue, no fever and no sore throat   Risk factors: obesity     Past Medical History  Diagnosis Date  . Obese   . ADD (attention deficit disorder)   . Depression   . IBS (irritable bowel syndrome)   . Ovarian cyst   . PMDD (premenstrual dysphoric disorder)   . Achilles tendon injury   . Vaso-vagal reaction   . Migraine    Past Surgical History  Procedure Laterality Date  . Laparoscopic cholecystectomy  2011   Family History  Problem Relation Age of Onset  . Skin cancer Mother     skin  . Prostate cancer Father   . Heart disease Father     AMI  . Heart failure Father   . Stroke Brother 40  . Breast cancer Maternal Grandmother   . Uterine cancer Maternal Grandmother     ? cervix  . Breast cancer Paternal Grandmother    History  Substance Use Topics  . Smoking status: Never Smoker   . Smokeless tobacco: Never Used  . Alcohol Use: Yes     Comment: 1/ in 6 months   OB History   Grav Para Term Preterm Abortions TAB SAB Ect Mult Living                 Review of Systems  Constitutional: Negative for fever, chills and fatigue.  HENT: Negative for sore throat.   Skin: Positive for rash.  All  other systems reviewed and are negative.    Allergies  Zofran  Home Medications   Current Outpatient Rx  Name  Route  Sig  Dispense  Refill  . ALPRAZolam (XANAX) 0.5 MG tablet   Oral   Take 0.5-1 tablets (0.25-0.5 mg total) by mouth daily as needed.   15 tablet   0   . buPROPion (WELLBUTRIN XL) 150 MG 24 hr tablet   Oral   Take 1 tablet (150 mg total) by mouth 2 (two) times daily.   180 tablet   0   . butalbital-acetaminophen-caffeine (FIORICET, ESGIC) 50-325-40 MG per tablet   Oral   Take 1 tablet by mouth 2 (two) times daily as needed for headache.         . cetirizine (ZYRTEC) 10 MG tablet   Oral   Take 10 mg by mouth daily as needed for allergies.         Marland Kitchen doxycycline (VIBRAMYCIN) 50 MG capsule   Oral   Take 1 capsule (50 mg total) by mouth 2 (two) times daily.   28 capsule   0   . fluticasone (FLONASE) 50 MCG/ACT  nasal spray   Nasal   Place 2 sprays into the nose daily as needed for rhinitis.         Marland Kitchen levonorgestrel-ethinyl estradiol (SEASONALE,INTROVALE,JOLESSA) 0.15-0.03 MG tablet   Oral   Take 1 tablet by mouth daily.         . methylphenidate (RITALIN) 20 MG tablet   Oral   Take 1 tablet (20 mg total) by mouth daily as needed (for additional concentration).   30 tablet   0   . omeprazole (PRILOSEC) 20 MG capsule   Oral   Take 20 mg by mouth daily.         . promethazine (PHENERGAN) 25 MG tablet   Oral   Take 1 tablet (25 mg total) by mouth every 6 (six) hours as needed.   15 tablet   0    BP 141/85  Pulse 91  Temp(Src) 98 F (36.7 C) (Oral)  Ht 5\' 7"  (1.702 m)  Wt 231 lb (104.781 kg)  BMI 36.17 kg/m2  SpO2 98% Physical Exam  Nursing note and vitals reviewed. Constitutional: She is oriented to person, place, and time. She appears well-developed and well-nourished. No distress.  Patient is obese (BMI 36.2)  HENT:  Head:    Cheeks and chin has mild macular erythema, less prominent on the left.  Posterior to the right  ear are tiny 0.84mm pustules on erythematous bases 3-18mm diameter, and several 2 to 39mm excoriations on cheeks and chin.  Eyes: Conjunctivae and EOM are normal. Pupils are equal, round, and reactive to light. Right eye exhibits no discharge. Left eye exhibits no discharge.  Neck: Neck supple.  Lymphadenopathy:    She has no cervical adenopathy.  Neurological: She is alert and oriented to person, place, and time.  Skin: Skin is warm and dry.    ED Course  Procedures  none      MDM   1. Folliculitis    Suspect new onset bacterial folliculitis complicating a chronic facial dermatitis (?rosacea) Begin doxycycline 50mg  BID Minimize sun exposure while taking doxycycline. Followup with dermatologist in two weeks.    Kandra Nicolas, MD 09/15/13 [phone]

## 2013-09-14 NOTE — ED Notes (Signed)
Red itchy rash on right side of face, behind rt ear and neck, on face about 3 weeks, behind ear and neck today, worse today

## 2013-10-13 ENCOUNTER — Other Ambulatory Visit: Payer: Self-pay | Admitting: Family Medicine

## 2013-10-30 ENCOUNTER — Encounter: Payer: Self-pay | Admitting: Family Medicine

## 2013-10-31 ENCOUNTER — Other Ambulatory Visit: Payer: Self-pay | Admitting: Family Medicine

## 2013-11-01 ENCOUNTER — Ambulatory Visit (INDEPENDENT_AMBULATORY_CARE_PROVIDER_SITE_OTHER): Payer: BC Managed Care – PPO | Admitting: Family Medicine

## 2013-11-01 ENCOUNTER — Encounter: Payer: Self-pay | Admitting: Family Medicine

## 2013-11-01 VITALS — BP 135/90 | HR 83 | Ht 67.0 in | Wt 233.0 lb

## 2013-11-01 DIAGNOSIS — F329 Major depressive disorder, single episode, unspecified: Secondary | ICD-10-CM

## 2013-11-01 DIAGNOSIS — F3289 Other specified depressive episodes: Secondary | ICD-10-CM

## 2013-11-01 DIAGNOSIS — F411 Generalized anxiety disorder: Secondary | ICD-10-CM

## 2013-11-01 DIAGNOSIS — F32A Depression, unspecified: Secondary | ICD-10-CM

## 2013-11-01 DIAGNOSIS — L719 Rosacea, unspecified: Secondary | ICD-10-CM

## 2013-11-01 MED ORDER — CITALOPRAM HYDROBROMIDE 20 MG PO TABS
ORAL_TABLET | ORAL | Status: DC
Start: 2013-11-01 — End: 2014-01-10

## 2013-11-01 NOTE — Patient Instructions (Signed)
Continue wellbutrin once a day until next Monday, then decrease to every other day for 8 days.   Then start the citalopram tomorrow. Follow instructions on the bottle.

## 2013-11-01 NOTE — Progress Notes (Signed)
   Subjective:    Patient ID: Kristina Martinez, female    DOB: 11/23/1970, 43 y.o.   MRN: [id number]  HPI Followup depression/anxiety-gad-7=14 phq-9=12 pt reports that wellbutrin is making her more anxious She complains of little interest or pleasure in doing things several days a week and feeling down depressed and hopeless several days a week. She's having some sleep issues and feels like she has low energy more than half the days. She also reports feeling bad about herself and having difficulty concentrating. She denies any thoughts of being better off dead or wanting to hurt herself. She says she feels nervous and on edge nearly every day and has difficulty relaxing. She feels like she worries excessively more than half the days. She rates her symptoms as very difficult.  Also got Headaches and constipation initially on the wellbutrin but that has gotten better.  Says has a new stressful job that she started Jan.  Waking up frequently at night and then her brain starting going.Tired during the daytime.  She had been on prozac for years and she felt too flat.     Review of Systems     Objective:   Physical Exam  Constitutional: She is oriented to person, place, and time. She appears well-developed and well-nourished.  HENT:  Head: Normocephalic and atraumatic.  Cardiovascular: Normal rate, regular rhythm and normal heart sounds.   Pulmonary/Chest: Effort normal and breath sounds normal.  Neurological: She is alert and oriented to person, place, and time.  Skin: Skin is warm and dry.  Psychiatric: She has a normal mood and affect. Her behavior is normal.          Assessment & Plan:  Depression/Anxiety- we discussed different options. We'll wean the Wellbutrin. Gave him written taper. I will start citalopram instead. Sugar was taking it years ago and felt like it worked well and did not remember having any night negative side effects with it. Followup in one month to make sure she's  tolerating it well. Over this will help control some of her anxiety symptoms. If not can consider switching to sertraline or Effexor.

## 2013-12-18 ENCOUNTER — Encounter: Payer: Self-pay | Admitting: Family Medicine

## 2013-12-18 ENCOUNTER — Other Ambulatory Visit: Payer: Self-pay | Admitting: Family Medicine

## 2013-12-18 DIAGNOSIS — Z1231 Encounter for screening mammogram for malignant neoplasm of breast: Secondary | ICD-10-CM

## 2013-12-20 ENCOUNTER — Other Ambulatory Visit: Payer: Self-pay | Admitting: *Deleted

## 2013-12-20 MED ORDER — METHYLPHENIDATE HCL 20 MG PO TABS: 20.0000 mg | ORAL_TABLET | Freq: Every day | ORAL | Status: DC | PRN

## 2013-12-20 NOTE — Telephone Encounter (Signed)
rx up front.Kristina Martinez

## 2013-12-25 ENCOUNTER — Ambulatory Visit (INDEPENDENT_AMBULATORY_CARE_PROVIDER_SITE_OTHER): Payer: BC Managed Care – PPO

## 2013-12-25 DIAGNOSIS — Z1231 Encounter for screening mammogram for malignant neoplasm of breast: Secondary | ICD-10-CM

## 2014-01-10 ENCOUNTER — Other Ambulatory Visit: Payer: Self-pay | Admitting: Family Medicine

## 2014-02-07 ENCOUNTER — Other Ambulatory Visit: Payer: Self-pay | Admitting: Family Medicine

## 2014-04-26 ENCOUNTER — Encounter: Payer: Self-pay | Admitting: Emergency Medicine

## 2014-04-26 ENCOUNTER — Emergency Department
Admission: EM | Admit: 2014-04-26 | Discharge: 2014-04-26 | Disposition: A | Discharge status: Home / Self Care | Payer: BC Managed Care – PPO | Source: Home / Self Care | Attending: Emergency Medicine | Admitting: Emergency Medicine

## 2014-04-26 DIAGNOSIS — M722 Plantar fascial fibromatosis: Secondary | ICD-10-CM

## 2014-04-26 DIAGNOSIS — M79671 Pain in right foot: Secondary | ICD-10-CM

## 2014-04-26 NOTE — ED Provider Notes (Signed)
CSN: [id number]     Arrival date & time 04/26/14  1557 History   First MD Initiated Contact with Patient 04/26/14 1559     Chief Complaint  Patient presents with  . Foot Pain   (Consider location/radiation/quality/duration/timing/severity/associated sxs/prior Treatment) HPI Left posterior/inferior heel pain for one day. Was worse this morning, sore, 9 out of 10. Now 5 out of 10 intensity. She has a long-standing history of left plantar fasciitis, she states has been bottom of left heel, intermittently for one year. Has seen Dr. Darene Lamer in the past, who did a therapeutic injection about a year ago. She is recently started exercising and running and that may have exacerbated it. Has not changed running shoes. Denies focal neurologic symptoms. Recalls no specific injury. Past Medical History  Diagnosis Date  . Obese   . ADD (attention deficit disorder)   . Depression   . IBS (irritable bowel syndrome)   . Ovarian cyst   . PMDD (premenstrual dysphoric disorder)   . Achilles tendon injury   . Vaso-vagal reaction   . Migraine    Past Surgical History  Procedure Laterality Date  . Laparoscopic cholecystectomy  2011   Family History  Problem Relation Age of Onset  . Skin cancer Mother     skin  . Prostate cancer Father   . Heart disease Father     AMI  . Heart failure Father   . Stroke Brother 40  . Breast cancer Maternal Grandmother   . Uterine cancer Maternal Grandmother     ? cervix  . Breast cancer Paternal Grandmother    History  Substance Use Topics  . Smoking status: Never Smoker   . Smokeless tobacco: Never Used  . Alcohol Use: Yes     Comment: 1/ in 6 months   OB History   Grav Para Term Preterm Abortions TAB SAB Ect Mult Living                 Review of Systems  All other systems reviewed and are negative.   Allergies  Zofran  Home Medications   Prior to Admission medications   Medication Sig Start Date End Date Taking? Authorizing Provider  ALPRAZolam  Duanne Moron) 0.5 MG tablet Take 0.5-1 tablets (0.25-0.5 mg total) by mouth daily as needed. 08/20/13   Hali Marry, MD  butalbital-acetaminophen-caffeine (FIORICET, ESGIC) [phone] MG per tablet Take 1 tablet by mouth 2 (two) times daily as needed for headache.    Historical Provider, MD  cetirizine (ZYRTEC) 10 MG tablet Take 10 mg by mouth daily as needed for allergies.    Historical Provider, MD  citalopram (CELEXA) 20 MG tablet TAKE 1/2 TABLET BY MOUTH EVERY DAY FOR 1 WEEK THEN TAKE 1 TABLET BY MOUTH EVERY DAY 01/10/14   Hali Marry, MD  fluticasone [facility]) 50 MCG/ACT nasal spray Place 2 sprays into the nose daily as needed for rhinitis.    Historical Provider, MD  Ivermectin (SOOLANTRA) 1 % CREA Apply 1 application topically daily.    Historical Provider, MD  JOLESSA 0.15-0.03 MG tablet TAKE 1 BY MOUTH DAILY    Hali Marry, MD  JOLESSA 0.15-0.03 MG tablet TAKE 1 BY MOUTH DAILY 02/07/14   Hali Marry, MD  methylphenidate (RITALIN) 20 MG tablet Take 1 tablet (20 mg total) by mouth daily as needed (for additional concentration). 12/20/13   Hali Marry, MD  minocycline (MINOCIN,DYNACIN) 100 MG capsule  10/22/13   Historical Provider, MD  omeprazole (PRILOSEC) 20 MG  capsule Take 20 mg by mouth daily.    Historical Provider, MD  promethazine (PHENERGAN) 25 MG tablet Take 1 tablet (25 mg total) by mouth every 6 (six) hours as needed. 04/16/13   Geradine Girt, DO   BP 123/78  Pulse 91  Temp(Src) 98.1 F (36.7 C) (Oral)  Resp 16  Ht 5\' 7"  (1.702 m)  Wt 232 lb (105.235 kg)  BMI 36.33 kg/m2  SpO2 98% Physical Exam  Nursing note and vitals reviewed. Constitutional: She is oriented to person, place, and time. She appears well-developed and well-nourished. No distress.  HENT:  Head: Normocephalic and atraumatic.  Eyes: Conjunctivae and EOM are normal. Pupils are equal, round, and reactive to light. No scleral icterus.  Neck: Normal range of motion.    Cardiovascular: Normal rate.   Pulmonary/Chest: Effort normal.  Abdominal: She exhibits no distension.  Musculoskeletal: Normal range of motion.       Left foot: She exhibits tenderness. She exhibits normal range of motion, no swelling, normal capillary refill, no deformity and no laceration.       Feet:  Neurological: She is alert and oriented to person, place, and time.  Skin: Skin is warm.  Psychiatric: She has a normal mood and affect.    ED Course  Procedures (including critical care time) Labs Review Labs Reviewed - No data to display  Imaging Review No results found.   MDM   1. Right foot pain   2. Plantar fasciitis of right foot   3. Heel pain, right     She declined any x-rays today. Over 25 minutes spent, greater than 50% of the time spent for counseling and coordination of care. Ibuprofen OTC 600 mg 3 times a day after meals. She declined any prescription pain medication. She declined any injections. She declined oral steroid. Discussed various general measures discussed for treating plantar fasciitis. Gave the option of following up with Dr. Darene Lamer or seeing podiatrist, to consider custom orthotics. Precautions discussed. Red flags discussed. Questions invited and answered. Patient voiced understanding and agreement.   Jacqulyn Cane, MD 04/26/14 [phone]

## 2014-04-26 NOTE — ED Notes (Signed)
Reports chronic plantar fascitis in left heel; got therapeutic injection last year from Dr.T; felt it was somewhat improved but has started new exercising/running and today pain returned.

## 2014-05-09 ENCOUNTER — Other Ambulatory Visit: Payer: Self-pay | Admitting: *Deleted

## 2014-05-09 MED ORDER — CITALOPRAM HYDROBROMIDE 20 MG PO TABS: ORAL_TABLET | ORAL | Status: DC

## 2014-05-14 ENCOUNTER — Other Ambulatory Visit: Payer: Self-pay | Admitting: *Deleted

## 2014-05-14 ENCOUNTER — Other Ambulatory Visit: Payer: Self-pay | Admitting: Family Medicine

## 2014-05-14 MED ORDER — METHYLPHENIDATE HCL 20 MG PO TABS: 20.0000 mg | ORAL_TABLET | Freq: Every day | ORAL | Status: DC | PRN

## 2014-05-14 MED ORDER — CITALOPRAM HYDROBROMIDE 20 MG PO TABS: ORAL_TABLET | ORAL | Status: DC

## 2014-05-17 ENCOUNTER — Other Ambulatory Visit: Payer: Self-pay | Admitting: Family Medicine

## 2014-05-28 ENCOUNTER — Ambulatory Visit (INDEPENDENT_AMBULATORY_CARE_PROVIDER_SITE_OTHER): Payer: BC Managed Care – PPO | Admitting: *Deleted

## 2014-05-28 ENCOUNTER — Ambulatory Visit (INDEPENDENT_AMBULATORY_CARE_PROVIDER_SITE_OTHER): Payer: BC Managed Care – PPO | Admitting: Family Medicine

## 2014-05-28 ENCOUNTER — Encounter: Payer: Self-pay | Admitting: Family Medicine

## 2014-05-28 VITALS — BP 159/90 | HR 91 | Temp 98.0°F | Wt 237.0 lb

## 2014-05-28 DIAGNOSIS — F418 Other specified anxiety disorders: Secondary | ICD-10-CM | POA: Diagnosis not present

## 2014-05-28 DIAGNOSIS — Z23 Encounter for immunization: Secondary | ICD-10-CM | POA: Diagnosis not present

## 2014-05-28 MED ORDER — ALPRAZOLAM 0.5 MG PO TABS: 0.2500 mg | ORAL_TABLET | Freq: Every day | ORAL | Status: DC | PRN

## 2014-05-28 MED ORDER — CITALOPRAM HYDROBROMIDE 20 MG PO TABS: 30.0000 mg | ORAL_TABLET | Freq: Every day | ORAL | Status: DC

## 2014-05-28 NOTE — Progress Notes (Addendum)
   Subjective:    Patient ID: Kristina Martinez, female    DOB: [birth year], 43 y.o.   MRN: [id number]  HPI six-month follow-up for depression/anxiety-she does complain of little interest or pleasure doing things more than half the days and also some difficulty with concentration. We had weaned her Wellbutrin and start citalopram I last saw her. Says hard to get motivated. Sleeping well.  Otherwise she feels like the medication is working very well and likes it much better than the Wellbutrin. She denies any major stressors at this time.  Review of Systems     Objective:   Physical Exam  Constitutional: She is oriented to person, place, and time. She appears well-developed and well-nourished.  HENT:  Head: Normocephalic and atraumatic.  Cardiovascular: Normal rate, regular rhythm and normal heart sounds.   Pulmonary/Chest: Effort normal and breath sounds normal.  Neurological: She is alert and oriented to person, place, and time.  Skin: Skin is warm and dry.  Psychiatric: She has a normal mood and affect. Her behavior is normal.          Assessment & Plan:  Depression/anxiety - PHQ 9 score of 7 today. Symptoms are mild. We discussed different options. She would like to try increasing the dose to 30 mg. I'll see her back in 6 months. She hasn't palms or concerns she can call me know and she can always drop back down to 20 mg if desired. Xanax filled today.  Flu vaccine given today.  Time spent 15 minutes, greater than 50% time spent counseling about depression and anxiety.

## 2014-06-12 ENCOUNTER — Telehealth: Payer: Self-pay

## 2014-06-12 ENCOUNTER — Other Ambulatory Visit: Payer: Self-pay

## 2014-06-12 MED ORDER — CITALOPRAM HYDROBROMIDE 20 MG PO TABS: 30.0000 mg | ORAL_TABLET | Freq: Every day | ORAL | Status: DC

## 2014-06-12 NOTE — Telephone Encounter (Signed)
Sent refill to [facility].

## 2014-06-12 NOTE — Telephone Encounter (Signed)
Sent new prescription to [facility].

## 2014-07-25 ENCOUNTER — Other Ambulatory Visit: Payer: Self-pay | Admitting: Family Medicine

## 2014-08-21 ENCOUNTER — Other Ambulatory Visit: Payer: Self-pay

## 2014-08-21 MED ORDER — LEVONORGEST-ETH ESTRAD 91-DAY 0.15-0.03 MG PO TABS: 1.0000 | ORAL_TABLET | Freq: Every day | ORAL | Status: DC

## 2014-10-15 ENCOUNTER — Other Ambulatory Visit: Payer: Self-pay | Admitting: Family Medicine

## 2014-11-17 ENCOUNTER — Other Ambulatory Visit: Payer: Self-pay | Admitting: Family Medicine

## 2014-11-18 NOTE — Telephone Encounter (Signed)
Patient needs appointment with Provider for any further refills. Attempted to contact Pt, no answer. Left voicemail to schedule appt.

## 2014-11-28 ENCOUNTER — Ambulatory Visit (INDEPENDENT_AMBULATORY_CARE_PROVIDER_SITE_OTHER): Payer: 59 | Admitting: Family Medicine

## 2014-11-28 ENCOUNTER — Encounter: Payer: Self-pay | Admitting: Family Medicine

## 2014-11-28 VITALS — BP 138/85 | HR 83 | Wt 250.0 lb

## 2014-11-28 DIAGNOSIS — Z0000 Encounter for general adult medical examination without abnormal findings: Secondary | ICD-10-CM

## 2014-11-28 DIAGNOSIS — F988 Other specified behavioral and emotional disorders with onset usually occurring in childhood and adolescence: Secondary | ICD-10-CM

## 2014-11-28 DIAGNOSIS — F909 Attention-deficit hyperactivity disorder, unspecified type: Secondary | ICD-10-CM

## 2014-11-28 DIAGNOSIS — F418 Other specified anxiety disorders: Secondary | ICD-10-CM | POA: Diagnosis not present

## 2014-11-28 MED ORDER — METHYLPHENIDATE HCL 20 MG PO TABS: 20.0000 mg | ORAL_TABLET | Freq: Every day | ORAL | Status: DC | PRN

## 2014-11-28 MED ORDER — CITALOPRAM HYDROBROMIDE 40 MG PO TABS: 40.0000 mg | ORAL_TABLET | Freq: Every day | ORAL | Status: DC

## 2014-11-28 MED ORDER — ALPRAZOLAM 0.5 MG PO TABS: 0.2500 mg | ORAL_TABLET | Freq: Every day | ORAL | Status: DC | PRN

## 2014-11-28 MED ORDER — LEVONORGEST-ETH ESTRAD 91-DAY 0.15-0.03 MG PO TABS: 1.0000 | ORAL_TABLET | Freq: Every day | ORAL | Status: DC

## 2014-11-28 NOTE — Progress Notes (Signed)
   Subjective:    Patient ID: Kristina Martinez, female    DOB: [birth year], 44 y.o.   MRN: [id number]  HPI Six-month follow-up for depression and anxiety-she comes in today complaining of little anxious or pleasure in doing things several days of the week and feeling down several days a week. Also reports trouble concentrating and feeling bad about herself. She is also requesting a refill on the alprazolam today.She says she feels tired a lot.  She has been working and traveling a lot.  No regular exercise. Overall she has a new job which she really likes. She likes her boss and she has a good relationship with her husband. There is no major stressors going on right now. She says this is difficult for her to stay focused. She'll stare computer for our sometimes before she can really get going.  Follow-up ADHD-she is currently on Ritalin 20 mg daily. No CP or palpitaions.  Doesnb't affect her sleep. REally uses it PRN.    Review of Systems     Objective:   Physical Exam  Constitutional: She is oriented to person, place, and time. She appears well-developed and well-nourished.  HENT:  Head: Normocephalic and atraumatic.  Cardiovascular: Normal rate, regular rhythm and normal heart sounds.   Pulmonary/Chest: Effort normal and breath sounds normal.  Neurological: She is alert and oriented to person, place, and time.  Skin: Skin is warm and dry.  Psychiatric: She has a normal mood and affect. Her behavior is normal.          Assessment & Plan:  Depression/anxiety-PHQ 9 score of 11 today. She rates her symptoms is somewhat difficult. We discussed different options. We discussed therapy but she feels like since she's not necessarily going through anything stressful night right now it would not necessarily be beneficial. We also discussed adjusting her medication. We'll increase to help and 40 mg for at least 6 weeks and see if she feels less apathetic. If not we can consider switching to  something else.  ADD- stable on current regimen. I did encourage her to use the medication a little bit more regularly over the next few weeks and see if that also helps with her motivation and getting things done. Next  She plans on scheduling a physical sometime this summer so we'll go ahead and order blood work today.

## 2014-11-29 ENCOUNTER — Other Ambulatory Visit: Payer: Self-pay | Admitting: Family Medicine

## 2014-11-29 DIAGNOSIS — Z1231 Encounter for screening mammogram for malignant neoplasm of breast: Secondary | ICD-10-CM

## 2014-12-07 ENCOUNTER — Other Ambulatory Visit: Payer: Self-pay | Admitting: Family Medicine

## 2014-12-09 NOTE — Telephone Encounter (Signed)
Requested Rx be sent to mail order pharmacy.

## 2015-01-09 ENCOUNTER — Encounter: Payer: Self-pay | Admitting: Sports Medicine

## 2015-01-09 ENCOUNTER — Ambulatory Visit (INDEPENDENT_AMBULATORY_CARE_PROVIDER_SITE_OTHER): Payer: 59 | Admitting: Sports Medicine

## 2015-01-09 ENCOUNTER — Ambulatory Visit (INDEPENDENT_AMBULATORY_CARE_PROVIDER_SITE_OTHER): Payer: 59

## 2015-01-09 VITALS — BP 143/92 | HR 81 | Ht 67.0 in | Wt 251.0 lb

## 2015-01-09 DIAGNOSIS — M1712 Unilateral primary osteoarthritis, left knee: Secondary | ICD-10-CM

## 2015-01-09 DIAGNOSIS — M25561 Pain in right knee: Secondary | ICD-10-CM | POA: Diagnosis not present

## 2015-01-09 DIAGNOSIS — M25562 Pain in left knee: Secondary | ICD-10-CM | POA: Diagnosis not present

## 2015-01-09 MED ORDER — NAPROXEN-ESOMEPRAZOLE 500-20 MG PO TBEC: 1.0000 | DELAYED_RELEASE_TABLET | Freq: Two times a day (BID) | ORAL | Status: DC

## 2015-01-09 NOTE — Progress Notes (Signed)
   Subjective:    I'm seeing this patient as a consultation for:  Dr. Beatrice Lecher  CC: Left knee pain  HPI: This is a pleasant 44 year old female, for the past several months she's had pain in her left knee, medial joint line and above the patella, moderate, persistent without radiation. Worse with weightbearing, squatting, going up and down stairs. No mechanical symptoms, no trauma. She's tried oral ibuprofen and Aleve without any improvement.  Past medical history, Surgical history, Family history not pertinant except as noted below, Social history, Allergies, and medications have been entered into the medical record, reviewed, and no changes needed.   Review of Systems: No headache, visual changes, nausea, vomiting, diarrhea, constipation, dizziness, abdominal pain, skin rash, fevers, chills, night sweats, weight loss, swollen lymph nodes, body aches, joint swelling, muscle aches, chest pain, shortness of breath, mood changes, visual or auditory hallucinations.   Objective:   General: Well Developed, well nourished, and in no acute distress.  Neuro/Psych: Alert and oriented x3, extra-ocular muscles intact, able to move all 4 extremities, sensation grossly intact. Skin: Warm and dry, no rashes noted.  Respiratory: Not using accessory muscles, speaking in full sentences, trachea midline.  Cardiovascular: Pulses palpable, no extremity edema. Abdomen: Does not appear distended. Left Knee: Visibly swollen with a mild palpable fluid wave and effusion with tenderness fairly severely over the medial joint line and the medial and lateral patellar facets ROM normal in flexion and extension and lower leg rotation. Ligaments with solid consistent endpoints including ACL, PCL, LCL, MCL. Negative Mcmurray's and provocative meniscal tests. Non painful patellar compression. Patellar and quadriceps tendons unremarkable. Hamstring and quadriceps strength is normal.  Procedure: Real-time  Ultrasound Guided Injection of left knee Device: GE Logiq E  Verbal informed consent obtained.  Time-out conducted.  Noted no overlying erythema, induration, or other signs of local infection.  Skin prepped in a sterile fashion.  Local anesthesia: Topical Ethyl chloride.  With sterile technique and under real time ultrasound guidance:  Aspirated 15 mL straw-colored fluid, syringe switched and 2 mL kenalog 40, 4 mL lidocaine injected easily. Completed without difficulty  Pain immediately resolved suggesting accurate placement of the medication.  Advised to call if fevers/chills, erythema, induration, drainage, or persistent bleeding.  Images permanently stored and available for review in the ultrasound unit.  Impression: Technically successful ultrasound guided injection.  Impression and Recommendations:   This case required medical decision making of moderate complexity.

## 2015-01-09 NOTE — Assessment & Plan Note (Signed)
X-rays, Vimovo, discontinue omeprazole, injection, rehabilitation exercises area She will follow-up with PCP to discuss weight loss.  Return to see me in one month.

## 2015-01-15 ENCOUNTER — Ambulatory Visit: Payer: Self-pay

## 2015-01-16 ENCOUNTER — Encounter: Payer: Self-pay | Admitting: Family Medicine

## 2015-01-21 ENCOUNTER — Other Ambulatory Visit: Payer: Self-pay | Admitting: Family Medicine

## 2015-01-28 ENCOUNTER — Other Ambulatory Visit (HOSPITAL_COMMUNITY)
Admission: RE | Admit: 2015-01-28 | Discharge: 2015-01-28 | Disposition: A | Discharge status: Home / Self Care | Payer: 59 | Source: Ambulatory Visit | Attending: Family Medicine | Admitting: Family Medicine

## 2015-01-28 ENCOUNTER — Encounter: Payer: Self-pay | Admitting: Family Medicine

## 2015-01-28 ENCOUNTER — Ambulatory Visit (INDEPENDENT_AMBULATORY_CARE_PROVIDER_SITE_OTHER): Payer: 59 | Admitting: Family Medicine

## 2015-01-28 VITALS — BP 136/83 | HR 86 | Ht 67.0 in | Wt 253.0 lb

## 2015-01-28 DIAGNOSIS — Z124 Encounter for screening for malignant neoplasm of cervix: Secondary | ICD-10-CM | POA: Diagnosis not present

## 2015-01-28 DIAGNOSIS — Z01419 Encounter for gynecological examination (general) (routine) without abnormal findings: Secondary | ICD-10-CM | POA: Insufficient documentation

## 2015-01-28 DIAGNOSIS — Z0189 Encounter for other specified special examinations: Secondary | ICD-10-CM

## 2015-01-28 DIAGNOSIS — Z1151 Encounter for screening for human papillomavirus (HPV): Secondary | ICD-10-CM | POA: Insufficient documentation

## 2015-01-28 DIAGNOSIS — R790 Abnormal level of blood mineral: Secondary | ICD-10-CM

## 2015-01-28 DIAGNOSIS — Z0000 Encounter for general adult medical examination without abnormal findings: Secondary | ICD-10-CM

## 2015-01-28 MED ORDER — ALPRAZOLAM 0.5 MG PO TABS: 0.2500 mg | ORAL_TABLET | Freq: Every day | ORAL | Status: DC | PRN

## 2015-01-28 MED ORDER — METHYLPHENIDATE HCL 20 MG PO TABS: 20.0000 mg | ORAL_TABLET | Freq: Every day | ORAL | Status: DC | PRN

## 2015-01-28 NOTE — Progress Notes (Signed)
  Subjective:     Kristina Martinez is a 43 y.o. female and is here for a comprehensive physical exam. The patient reports no problems.  History   Social History  . Marital Status: Married    Spouse Name: N/A  . Number of Children: N/A  . Years of Education: N/A   Occupational History  . Not on file.   Social History Main Topics  . Smoking status: Never Smoker   . Smokeless tobacco: Never Used  . Alcohol Use: Yes     Comment: 1/ in 6 months  . Drug Use: No  . Sexual Activity: Not on file   Other Topics Concern  . Not on file   Social History Narrative   Health Maintenance  Topic Date Due  . HIV Screening  10/17/1983  . PAP SMEAR  10/22/2014  . INFLUENZA VACCINE  01/27/2015  . TETANUS/TDAP  12/13/2017    The following portions of the patient's history were reviewed and updated as appropriate: allergies, current medications, past family history, past medical history, past social history, past surgical history and problem list.  Review of Systems A comprehensive review of systems was negative.   Objective:    BP 136/83 mmHg  Pulse 86  Ht 5\' 7"  (1.702 m)  Wt 253 lb (114.76 kg)  BMI 39.62 kg/m2 General appearance: alert, cooperative and appears stated age Head: Normocephalic, without obvious abnormality, atraumatic Eyes: conj clear, EOMI, pEERLA Ears: normal TM's and external ear canals both ears Nose: Nares normal. Septum midline. Mucosa normal. No drainage or sinus tenderness. Throat: lips, mucosa, and tongue normal; teeth and gums normal Neck: no adenopathy, no carotid bruit, no JVD, supple, symmetrical, trachea midline and thyroid not enlarged, symmetric, no tenderness/mass/nodules Back: symmetric, no curvature. ROM normal. No CVA tenderness. Lungs: clear to auscultation bilaterally Breasts: normal appearance, no masses or tenderness Heart: regular rate and rhythm, S1, S2 normal, no murmur, click, rub or gallop Abdomen: soft, non-tender; bowel sounds normal; no  masses,  no organomegaly Pelvic: cervix normal in appearance, external genitalia normal, no adnexal masses or tenderness, no cervical motion tenderness, rectovaginal septum normal, uterus normal size, shape, and consistency, vagina normal without discharge and cervix easily friable Extremities: extremities normal, atraumatic, no cyanosis or edema Pulses: 2+ and symmetric Skin: Skin color, texture, turgor normal. No rashes or lesions Lymph nodes: Cervical, supraclavicular, and axillary nodes normal. Neurologic: Alert and oriented X 3, normal strength and tone. Normal symmetric reflexes. Normal coordination and gait    Assessment:    Healthy female exam.     Plan:     See After Visit Summary for Counseling Recommendations  Keep up a regular exercise program and make sure you are eating a healthy diet Try to eat 4 servings of dairy a day, or if you are lactose intolerant take a calcium with vitamin D daily.  Your vaccines are up to date.  Pap smear performed - will call with pathology report in one week.

## 2015-01-28 NOTE — Patient Instructions (Signed)
Keep up a regular exercise program and make sure you are eating a healthy diet Try to eat 4 servings of dairy a day, or if you are lactose intolerant take a calcium with vitamin D daily.  Your vaccines are up to date.   

## 2015-01-29 ENCOUNTER — Ambulatory Visit (INDEPENDENT_AMBULATORY_CARE_PROVIDER_SITE_OTHER): Payer: 59

## 2015-01-29 DIAGNOSIS — Z1231 Encounter for screening mammogram for malignant neoplasm of breast: Secondary | ICD-10-CM | POA: Diagnosis not present

## 2015-01-30 LAB — CYTOLOGY - PAP

## 2015-02-03 ENCOUNTER — Telehealth: Payer: Self-pay | Admitting: *Deleted

## 2015-02-03 NOTE — Telephone Encounter (Signed)
I am not really sure what would cause this.  I would give it a week or two and see if just goes away. If not then may need to come back in for [place] to re-examine the area.

## 2015-02-03 NOTE — Telephone Encounter (Signed)
Pt notified of recommendations

## 2015-02-03 NOTE — Telephone Encounter (Signed)
Pt called today stating that since her pap last week she's been experiencing a "weird sensation".  When I asked for an explanation, she stated that it basically feels like a tampon is falling out but there's nothing there.  Denies dysuria or any vaginal itching.  I told her that I would get your thoughts on this and call her back.

## 2015-02-10 ENCOUNTER — Ambulatory Visit (INDEPENDENT_AMBULATORY_CARE_PROVIDER_SITE_OTHER): Payer: 59 | Admitting: Sports Medicine

## 2015-02-10 ENCOUNTER — Encounter: Payer: Self-pay | Admitting: Sports Medicine

## 2015-02-10 VITALS — BP 127/82 | HR 84 | Ht 67.0 in | Wt 251.0 lb

## 2015-02-10 DIAGNOSIS — N3090 Cystitis, unspecified without hematuria: Secondary | ICD-10-CM | POA: Diagnosis not present

## 2015-02-10 DIAGNOSIS — R350 Frequency of micturition: Secondary | ICD-10-CM | POA: Diagnosis not present

## 2015-02-10 DIAGNOSIS — M1712 Unilateral primary osteoarthritis, left knee: Secondary | ICD-10-CM

## 2015-02-10 LAB — POCT URINALYSIS DIPSTICK
Bilirubin, UA: NEGATIVE
Glucose, UA: NEGATIVE
Nitrite, UA: NEGATIVE
Spec Grav, UA: >=1.03
Urobilinogen, UA: 0.2
pH, UA: 6

## 2015-02-10 MED ORDER — CEPHALEXIN 500 MG PO CAPS: 500.0000 mg | ORAL_CAPSULE | Freq: Two times a day (BID) | ORAL | Status: DC

## 2015-02-10 NOTE — Progress Notes (Signed)
  Subjective:    CC: Follow-up and UTI  HPI: Knee osteoarthritis: Overall doing well after injection, gets a bit of tightness but has not been doing any icing or compression.  Dysuria: With occasional frequency, she does get a single UTI per year, previous cultures have grown out Escherichia coli, essentially pansensitive, symptoms are moderate, persistent, no constitutional symptoms with the exception of mild fatigue, no flank pain.  Past medical history, Surgical history, Family history not pertinant except as noted below, Social history, Allergies, and medications have been entered into the medical record, reviewed, and no changes needed.   Review of Systems: No fevers, chills, night sweats, weight loss, chest pain, or shortness of breath.   Objective:    General: Well Developed, well nourished, and in no acute distress.  Neuro: Alert and oriented x3, extra-ocular muscles intact, sensation grossly intact.  HEENT: Normocephalic, atraumatic, pupils equal round reactive to light, neck supple, no masses, no lymphadenopathy, thyroid nonpalpable.  Skin: Warm and dry, no rashes. Cardiac: Regular rate and rhythm, no murmurs rubs or gallops, no lower extremity edema.  Respiratory: Clear to auscultation bilaterally. Not using accessory muscles, speaking in full sentences. Abdomen: Soft, nontender, nondistended, no bowel sounds, no palpable masses, minimal suprapubic tenderness and no costovertebral angle tenderness.  Urinalysis is positive for leukocytes.  Impression and Recommendations:

## 2015-02-10 NOTE — Assessment & Plan Note (Signed)
Previous cultures have all grown out essentially pansensitive Escherichia coli. We are going to use Keflex twice a day for 7 days. Next line return as needed.

## 2015-02-10 NOTE — Assessment & Plan Note (Signed)
Doing well, gets occasional tightness, needs to get her knee sleeves., [place].

## 2015-02-12 LAB — URINE CULTURE: Colony Count: 30000

## 2015-03-05 LAB — COMPLETE METABOLIC PANEL WITH GFR
ALBUMIN: 3.6 g/dL (ref 3.6–5.1)
ALT: 10 U/L (ref 6–29)
AST: 20 U/L (ref 10–35)
Alkaline Phosphatase: 94 U/L (ref 33–115)
BUN: 10 mg/dL (ref 7–25)
CO2: 23 mmol/L (ref 20–31)
Calcium: 8.6 mg/dL (ref 8.6–10.2)
Chloride: 106 mmol/L (ref 98–110)
Creat: 0.57 mg/dL (ref 0.50–1.10)
GFR, Est African American: >89 mL/min (ref >=60)
GFR, Est Non African American: >89 mL/min (ref >=60)
GLUCOSE: 88 mg/dL (ref 65–99)
POTASSIUM: 4.5 mmol/L (ref 3.5–5.3)
SODIUM: 137 mmol/L (ref 135–146)
Total Bilirubin: 0.3 mg/dL (ref 0.2–1.2)
Total Protein: 6.4 g/dL (ref 6.1–8.1)

## 2015-03-05 LAB — LIPID PANEL
CHOL/HDL RATIO: 5.6 ratio — AB (ref <=5.0)
Cholesterol: 235 mg/dL — ABNORMAL HIGH (ref 125–200)
HDL: 42 mg/dL — ABNORMAL LOW (ref >=46)
LDL Cholesterol: 148 mg/dL — ABNORMAL HIGH (ref <130)
Triglycerides: 224 mg/dL — ABNORMAL HIGH (ref <150)
VLDL: 45 mg/dL — ABNORMAL HIGH (ref <30)

## 2015-03-05 LAB — FERRITIN: Ferritin: 5 ng/mL — ABNORMAL LOW (ref 10–291)

## 2015-03-05 LAB — HIV ANTIBODY (ROUTINE TESTING W REFLEX): HIV 1&2 Ab, 4th Generation: NONREACTIVE

## 2015-03-12 ENCOUNTER — Telehealth: Payer: Self-pay | Admitting: *Deleted

## 2015-03-12 MED ORDER — DEXLANSOPRAZOLE 60 MG PO CPDR: 60.0000 mg | DELAYED_RELEASE_CAPSULE | Freq: Every day | ORAL | Status: DC

## 2015-03-12 NOTE — Telephone Encounter (Signed)
i will cal in [place]. If still not helping then needs appt. REally watch diet. Avoid  Greasy spicy foods. Avoid carbonated beverages. Avoid caffeine which is an irritant to the stomach. Avoid really acidic foods like orange juice and tomatoes.

## 2015-03-12 NOTE — Telephone Encounter (Signed)
Pt called and lvm reporting that for the past few days she has been experiencing really bad acid reflux. She has doubled her Omeprazole and this has not helped her. She would like to know if Dr. Madilyn Fireman has any other recommendations for her. Will fwd to pcp for advice.Kristina Martinez

## 2015-03-12 NOTE — Telephone Encounter (Signed)
Pt informed of recommendations.Kristina Martinez [name]  

## 2015-03-22 ENCOUNTER — Other Ambulatory Visit: Payer: Self-pay | Admitting: Family Medicine

## 2015-04-08 ENCOUNTER — Ambulatory Visit (INDEPENDENT_AMBULATORY_CARE_PROVIDER_SITE_OTHER): Payer: 59 | Admitting: Family Medicine

## 2015-04-08 VITALS — BP 140/90 | HR 81 | Temp 98.3°F | Wt 252.0 lb

## 2015-04-08 DIAGNOSIS — R300 Dysuria: Secondary | ICD-10-CM

## 2015-04-08 DIAGNOSIS — R319 Hematuria, unspecified: Secondary | ICD-10-CM | POA: Diagnosis not present

## 2015-04-08 LAB — POCT URINALYSIS DIPSTICK
Bilirubin, UA: NEGATIVE
GLUCOSE UA: NEGATIVE
Ketones, UA: NEGATIVE
Nitrite, UA: NEGATIVE
PROTEIN UA: NEGATIVE
SPEC GRAV UA: 1.025
UROBILINOGEN UA: 0.2
pH, UA: 5.5

## 2015-04-08 MED ORDER — SULFAMETHOXAZOLE-TRIMETHOPRIM 800-160 MG PO TABS: 1.0000 | ORAL_TABLET | Freq: Two times a day (BID) | ORAL | Status: DC

## 2015-04-08 NOTE — Progress Notes (Signed)
   Subjective:    Patient ID: Kristina Martinez, female    DOB: 1971-01-12, 44 y.o.   MRN: [id number]  HPI    Review of Systems     Objective:   Physical Exam        Assessment & Plan:  UTI - will tx with Bactrim DS x 3 days. Call if not better in 5 days. Hydrated well.   Beatrice Lecher, MD

## 2015-04-08 NOTE — Progress Notes (Signed)
Patient came into clinic today for possible UTI. Pt reports dysuria, hematuria, and flank pain for the past 3 days. 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 Patient was able to provide a urine sample in clinic today for testing. Prior to Pt leaving I verified her contact phone number and pharmacy were correct in the system. Advised I would contact her with any results and and possible Rx's.

## 2015-04-09 LAB — URINE CULTURE
Colony Count: NO GROWTH
Organism ID, Bacteria: NO GROWTH

## 2015-04-09 NOTE — Progress Notes (Signed)
Pt advised of new Rx and when to check back in for possible follow up.

## 2015-06-17 ENCOUNTER — Telehealth: Payer: Self-pay

## 2015-06-17 MED ORDER — DEXLANSOPRAZOLE 60 MG PO CPDR: 60.0000 mg | DELAYED_RELEASE_CAPSULE | Freq: Every day | ORAL | Status: DC

## 2015-06-17 NOTE — Telephone Encounter (Signed)
Patient requesting for a refill of Dexilant to be sent to the [facility], [place]

## 2015-07-14 ENCOUNTER — Telehealth: Payer: Self-pay | Admitting: Family Medicine

## 2015-07-14 NOTE — Telephone Encounter (Signed)
Pt called office requesting refill on Ritalin. Based on last office visit note from PCP, Pt was to follow up for ADD in November 2016. Returned Pt call and informed that she would need an appt before a refill could be written.

## 2015-07-18 MED ORDER — METHYLPHENIDATE HCL 20 MG PO TABS: 20.0000 mg | ORAL_TABLET | Freq: Every day | ORAL | Status: DC | PRN

## 2015-07-18 NOTE — Telephone Encounter (Signed)
Reprinted Rx with correct directions. Pt aware Rx is ready for pick up.

## 2015-07-18 NOTE — Addendum Note (Signed)
Addended by: Beatrice Lecher D on: 07/18/2015 03:39 PM   Modules accepted: Orders

## 2015-07-18 NOTE — Addendum Note (Signed)
Addended by: Huel Cote on: 07/18/2015 04:28 PM   Modules accepted: Orders

## 2015-07-18 NOTE — Telephone Encounter (Signed)
Pt scheduled an appt for 07/28/15. Wants to know if an Rx for Ritalin can be written to last until then? Will route to PCP for review.

## 2015-07-18 NOTE — Telephone Encounter (Signed)
Ok to fill 

## 2015-07-20 ENCOUNTER — Other Ambulatory Visit: Payer: Self-pay | Admitting: Family Medicine

## 2015-07-28 ENCOUNTER — Ambulatory Visit (INDEPENDENT_AMBULATORY_CARE_PROVIDER_SITE_OTHER): Payer: 59 | Admitting: Family Medicine

## 2015-07-28 ENCOUNTER — Encounter: Payer: Self-pay | Admitting: Family Medicine

## 2015-07-28 VITALS — BP 140/79 | HR 92 | Wt 241.0 lb

## 2015-07-28 DIAGNOSIS — F988 Other specified behavioral and emotional disorders with onset usually occurring in childhood and adolescence: Secondary | ICD-10-CM

## 2015-07-28 DIAGNOSIS — F411 Generalized anxiety disorder: Secondary | ICD-10-CM | POA: Diagnosis not present

## 2015-07-28 DIAGNOSIS — Z23 Encounter for immunization: Secondary | ICD-10-CM

## 2015-07-28 DIAGNOSIS — F909 Attention-deficit hyperactivity disorder, unspecified type: Secondary | ICD-10-CM

## 2015-07-28 DIAGNOSIS — M25572 Pain in left ankle and joints of left foot: Secondary | ICD-10-CM

## 2015-07-28 MED ORDER — METHYLPHENIDATE HCL 20 MG PO TABS: 20.0000 mg | ORAL_TABLET | Freq: Every day | ORAL | Status: DC | PRN

## 2015-07-28 NOTE — Progress Notes (Signed)
   Subjective:    Patient ID: Kristina Martinez, female    DOB: 1971/06/10, 44 y.o.   MRN: [id number]  HPI ADD - she is doing well on the methylphenidate. No chest pain, palpitations or insomnia. No side effects of the medication. She is happy with her current regimen and feels like it's effective to help control her ADD symptoms.  Anxiety  - she's doing well with her citalopram 40 mg daily. No side effects. 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 They've been having some construction issues this week   so she has been a little stressed.  C/O let ankle pain for  a couple of weeks. She denies twisting it or injuring it per se. It just feels a little painful if she dorsiflexes the foot at the crease of the ankle. No trauma or swelling.  Review of Systems     Objective:   Physical Exam  Constitutional: She is oriented to person, place, and time. She appears well-developed and well-nourished.  HENT:  Head: Normocephalic and atraumatic.  Cardiovascular: Normal rate, regular rhythm and normal heart sounds.   Pulmonary/Chest: Effort normal and breath sounds normal.  Neurological: She is alert and oriented to person, place, and time.  Skin: Skin is warm and dry.  Psychiatric: She has a normal mood and affect. Her behavior is normal.    left ankle with normal range of motion. Strength is 5 out of 5 in all directions. She is tender since her digitorum at the crease between the ankle and foot.       Assessment & Plan:  ADD - well controlled on current regimen. Her blood pressure is still mildly elevated today. Time to have her come back in a couple weeks to recheck Pressure excellent importance of having her BP well controlled especially while taking a stimulant medication. She agrees to return. I did go ahead and refill her medications today.  Anxiety - controlled on citalopram and when necessary alprazolam. Next  Left ankle  pain-most consistent with ligament strain. Given handout on some stretches to do on her own at home. Call if getting worse or not improving over the next few weeks. Next  Flu vaccine given today.

## 2015-10-13 ENCOUNTER — Other Ambulatory Visit: Payer: Self-pay | Admitting: *Deleted

## 2015-10-13 MED ORDER — ALPRAZOLAM 0.5 MG PO TABS: 0.2500 mg | ORAL_TABLET | Freq: Every day | ORAL | Status: DC | PRN

## 2015-10-24 ENCOUNTER — Other Ambulatory Visit: Payer: Self-pay | Admitting: Family Medicine

## 2015-11-10 ENCOUNTER — Ambulatory Visit (INDEPENDENT_AMBULATORY_CARE_PROVIDER_SITE_OTHER): Payer: 59 | Admitting: Family Medicine

## 2015-11-10 ENCOUNTER — Encounter: Payer: Self-pay | Admitting: Family Medicine

## 2015-11-10 VITALS — BP 140/82 | HR 108 | Temp 98.3°F | Wt 246.0 lb

## 2015-11-10 DIAGNOSIS — J018 Other acute sinusitis: Secondary | ICD-10-CM

## 2015-11-10 DIAGNOSIS — H109 Unspecified conjunctivitis: Secondary | ICD-10-CM

## 2015-11-10 MED ORDER — AMOXICILLIN-POT CLAVULANATE 875-125 MG PO TABS: 1.0000 | ORAL_TABLET | Freq: Two times a day (BID) | ORAL | Status: DC

## 2015-11-10 NOTE — Patient Instructions (Signed)

## 2015-11-10 NOTE — Progress Notes (Signed)
   Subjective:    Patient ID: Kristina Martinez, female    DOB: October 24, 1970, 45 y.o.   MRN: [id number]  HPI Patient comes in today with 6 days of nasal congestion and facial pressure and sore throat. She feels like she's been getting worse over the last couple of days. She feels like she's been having some subjective fevers and getting hot and cold and feeling sweaty. Her husband was also recently treated for sinus infection with antibiotics. She has been taking some Mucinex and then workup with her eyes very red and injected this morning. She denies any itching but she says she does feel like there is a burning sensation she does not normally have significant spring allergies and she is not taking any antihistamines etc. No worsening or alleviating factors.   Review of Systems     Objective:   Physical Exam  Constitutional: She is oriented to person, place, and time. She appears well-developed and well-nourished.  HENT:  Head: Normocephalic and atraumatic.  Right Ear: External ear normal.  Left Ear: External ear normal.  Nose: Nose normal.  Mouth/Throat: Oropharynx is clear and moist.  TMs and canals are clear.   Eyes: Conjunctivae and EOM are normal. Pupils are equal, round, and reactive to light.  Sclera are injected bilaterally   Neck: Neck supple. No thyromegaly present.  Cardiovascular: Normal rate, regular rhythm and normal heart sounds.   Pulmonary/Chest: Effort normal and breath sounds normal. She has no wheezes.  Lymphadenopathy:    She has no cervical adenopathy.  Neurological: She is alert and oriented to person, place, and time.  Skin: Skin is warm and dry.  Psychiatric: She has a normal mood and affect.          Assessment & Plan:  Acute sinusitis-we'll treat with Augmentin area call if not better in one week or suddenly gets worse. Okay to use over-the-counter cough and cold medication as needed.  Conjunctivitis-most likely related to acute infection but certainly  could be allergic as well. Call if not improving or suddenly worsens.

## 2015-11-10 NOTE — Addendum Note (Signed)
Addended by: Beatrice Lecher D on: 11/10/2015 02:57 PM   Modules accepted: Orders

## 2015-12-04 ENCOUNTER — Ambulatory Visit (INDEPENDENT_AMBULATORY_CARE_PROVIDER_SITE_OTHER): Payer: 59 | Admitting: Osteopathic Medicine

## 2015-12-04 ENCOUNTER — Ambulatory Visit (INDEPENDENT_AMBULATORY_CARE_PROVIDER_SITE_OTHER): Payer: 59

## 2015-12-04 ENCOUNTER — Encounter: Payer: Self-pay | Admitting: Osteopathic Medicine

## 2015-12-04 ENCOUNTER — Other Ambulatory Visit: Payer: Self-pay | Admitting: Osteopathic Medicine

## 2015-12-04 VITALS — BP 138/86 | HR 100 | Temp 98.8°F | Ht 67.0 in | Wt 248.0 lb

## 2015-12-04 DIAGNOSIS — R059 Cough, unspecified: Secondary | ICD-10-CM

## 2015-12-04 DIAGNOSIS — R05 Cough: Secondary | ICD-10-CM

## 2015-12-04 DIAGNOSIS — J209 Acute bronchitis, unspecified: Secondary | ICD-10-CM

## 2015-12-04 MED ORDER — AZITHROMYCIN 250 MG PO TABS: ORAL_TABLET | ORAL | Status: DC

## 2015-12-04 MED ORDER — GUAIFENESIN-CODEINE 100-10 MG/5ML PO SOLN: 5.0000 mL | Freq: Four times a day (QID) | ORAL | Status: DC | PRN

## 2015-12-04 MED ORDER — FLUTICASONE PROPIONATE HFA 110 MCG/ACT IN AERO: 2.0000 | INHALATION_SPRAY | Freq: Two times a day (BID) | RESPIRATORY_TRACT | Status: DC

## 2015-12-04 MED ORDER — METHYLPREDNISOLONE 4 MG PO TBPK: ORAL_TABLET | ORAL | Status: DC

## 2015-12-04 NOTE — Progress Notes (Signed)
HPI: Kristina Martinez is a 45 y.o. female who presents to [place]  today for chief complaint of:  Chief Complaint  Patient presents with  . Cough    . Context: persistent coughing seems to be getting a bit worse lately . Location: chest but also pain in head/sinuses, runny nose . Quality: dry cough  . Assoc signs/symptoms: see ROS - coughing is main problem, runny nose, sinus, hot sweaty but no fever measured at home, occasional posttussive emesis (x2 overall) . Duration: 5 weeks+ . Modifying factors: has tried the following OTC/Rx medications: augmentin few weeks ago, sudafed, nyquil, dayquil    Past medical, social and family history reviewed. Current medications and allergies reviewed.     Review of Systems: CONSTITUTIONAL: no fever/chills HEAD/EYES/EARS/NOSE/THROAT: sinus headache, no vision change or hearing change, no sore throat CARDIAC: No chest pain, No pressure/palpitations RESPIRATORY: yes cough, no shortness of breath GASTROINTESTINAL: no nausea, no vomiting, no abdominal pain, no diarrhea MUSCULOSKELETAL: no myalgia/arthralgia SKIN: no rash   Exam:  BP 138/86 mmHg  Pulse 100  Temp(Src) 98.8 F (37.1 C) (Oral)  Ht 5\' 7"  (1.702 m)  Wt 248 lb (112.492 kg)  BMI 38.83 kg/m2  SpO2 96% Constitutional: VSS, see above. General Appearance: alert, well-developed, well-nourished, NAD Eyes: Normal lids and conjunctive, non-icteric sclera, PERRLA Ears, Nose, Mouth, Throat: Normal external inspection ears/nares/mouth/lips/gums, normal TM, MMM;       posterior pharynx without erythema, without exudate, nasal mucosa normal Neck: No masses, trachea midline. normal lymph nodes Respiratory: Normal respiratory effort. Yes bronchial sounds more pronounced on L, no significant Wheeze/rhonchi/rales, pt coughing much of exam Cardiovascular: S1/S2 normal, no murmur/rub/gallop auscultated. RRR.   No results found for this or any previous visit  (from the past 72 hour(s)).    CXR on personal review Cardiomediastinal silhouette/heart size: normal Obvious bony abnormality: none Infiltrate: diffuse interstitial markings ? Exposure of image vs bronchitis changes Mass or other opacity: none Atelectasis: none Diaphragms: normal Lateral view: normal Images were reviewed with the patient. Pt counseled that radiologist will review the images as well, our office will call if the formal read reveals any significant findings other than what has been noted above.   Dg Chest 2 View  12/04/2015  CLINICAL DATA:  C/o cough and congestion x 5 weeks. Hx cholecystectomy. EXAM: CHEST - 2 VIEW COMPARISON:  03/25/2011 FINDINGS: Lungs are clear. Heart size normal.  Tortuous thoracic aorta. No effusion or pneumothorax. Minimal spurring in the mid thoracic spine. Surgical clips right upper abdomen. IMPRESSION: No acute cardiopulmonary disease. Electronically Signed   By: Lucrezia Europe M.D.   On: 12/04/2015 09:03     ASSESSMENT/PLAN: RTC if no improvement, will test for pertussis given length of illness and posttussive emesis, Td vax given 2009, pt not around children. Meds as below.   Cough - Plan: DG Chest 2 View, guaiFENesin-codeine 100-10 MG/5ML syrup, methylPREDNISolone (MEDROL DOSEPAK) 4 MG TBPK tablet, fluticasone (FLOVENT HFA) 110 MCG/ACT inhaler, Bordetella pertussis PCR, Culture, Bordetella  Acute bronchitis, unspecified organism - Plan: azithromycin (ZITHROMAX) 250 MG tablet    Visit summary was printed for the patient with medications and pertinent instructions for patient to review. ER/RTC precautions reviewed. All questions answered. Return if symptoms worsen or fail to improve.

## 2015-12-20 LAB — BORDETELLA PERTUSSIS PCR
B PARAPERTUSSIS, DNA: NOT DETECTED
B pertussis, DNA: NOT DETECTED

## 2015-12-20 LAB — CULTURE, BORDETELLA PERTUSSIS

## 2015-12-23 ENCOUNTER — Other Ambulatory Visit: Payer: Self-pay | Admitting: Family Medicine

## 2015-12-25 ENCOUNTER — Other Ambulatory Visit: Payer: Self-pay | Admitting: Family Medicine

## 2016-02-26 ENCOUNTER — Ambulatory Visit (INDEPENDENT_AMBULATORY_CARE_PROVIDER_SITE_OTHER): Payer: 59 | Admitting: Family Medicine

## 2016-02-26 ENCOUNTER — Encounter: Payer: Self-pay | Admitting: Family Medicine

## 2016-02-26 VITALS — BP 133/75 | HR 97 | Ht 67.0 in | Wt 251.0 lb

## 2016-02-26 DIAGNOSIS — Z23 Encounter for immunization: Secondary | ICD-10-CM

## 2016-02-26 DIAGNOSIS — F909 Attention-deficit hyperactivity disorder, unspecified type: Secondary | ICD-10-CM

## 2016-02-26 DIAGNOSIS — F419 Anxiety disorder, unspecified: Secondary | ICD-10-CM

## 2016-02-26 DIAGNOSIS — F331 Major depressive disorder, recurrent, moderate: Secondary | ICD-10-CM

## 2016-02-26 DIAGNOSIS — R5383 Other fatigue: Secondary | ICD-10-CM

## 2016-02-26 DIAGNOSIS — F988 Other specified behavioral and emotional disorders with onset usually occurring in childhood and adolescence: Secondary | ICD-10-CM

## 2016-02-26 DIAGNOSIS — Z13220 Encounter for screening for lipoid disorders: Secondary | ICD-10-CM

## 2016-02-26 MED ORDER — METHYLPHENIDATE HCL 20 MG PO TABS: 20.0000 mg | ORAL_TABLET | Freq: Every day | ORAL | 0 refills | Status: DC | PRN

## 2016-02-26 MED ORDER — CITALOPRAM HYDROBROMIDE 40 MG PO TABS: 40.0000 mg | ORAL_TABLET | Freq: Every day | ORAL | 1 refills | Status: DC

## 2016-02-26 MED ORDER — FUSION PLUS PO CAPS: 1.0000 | ORAL_CAPSULE | Freq: Every day | ORAL | 5 refills | Status: DC

## 2016-02-26 MED ORDER — ALPRAZOLAM 0.5 MG PO TABS: 0.2500 mg | ORAL_TABLET | Freq: Every day | ORAL | 3 refills | Status: DC | PRN

## 2016-02-26 NOTE — Progress Notes (Signed)
Subjective:    CC: ADD  HPI: ADD F/U  - currently on Ritalin 20mg .  Denies any CP, SOB, palps, or insomnia on the medication. She doesn't take her medication daily but just when she needs to do certain work tasks.  Depression/Anxiety -  She is currently on citalopram. She does complain of feeling nervous and on edge several days of the week and feeling like she worries too much. She denies any excess irritability or restlessness or fearfulness. She rates her symptoms is somewhat difficult.  He has really struggled with low energy. She says even on her days off she's been going home and sleeping. Last year when we did blood work she was noted to have low iron. She did try taking iron but it caused severe constipation even with the use of a stool softener. She even tried the slow iron and it caused similar symptoms. She did have on the heavy menstrual cycle last month because she stopped her by mouth temporarily because she was on  antibiotics.  Is also requesting a refill on Xanax which she just uses for flying. She has some trips coming up soon.  Past medical history, Surgical history, Family history not pertinant except as noted below, Social history, Allergies, and medications have been entered into the medical record, reviewed, and corrections made.   Review of Systems: No fevers, chills, night sweats, weight loss, chest pain, or shortness of breath.   Objective:    General: Well Developed, well nourished, and in no acute distress.  Neuro: Alert and oriented x3, extra-ocular muscles intact, sensation grossly intact.  HEENT: Normocephalic, atraumatic  Skin: Warm and dry, no rashes. Cardiac: Regular rate and rhythm, no murmurs rubs or gallops, no lower extremity edema.  Respiratory: Clear to auscultation bilaterally. Not using accessory muscles, speaking in full sentences.   Impression and Recommendations:    ADD-  Well-controlled. Continue current regimen. Ritalin refilled  today.  Depression/Anxiety - GAD 7 score of 2 today which is under good control. Continue current regimen for now. PHQ 9 score of 8.  3 of the points were for fatigue and 2 of the points were for difficulty concentrating.  Flight anxiety-we'll refill her alprazolam. #15 tabs.  Fatigue-we'll recheck CBC and ferritin level. We can try prescription fusion plus may not be covered by her insurance. Can consider infant drops if needed.

## 2016-04-07 ENCOUNTER — Other Ambulatory Visit: Payer: Self-pay | Admitting: Family Medicine

## 2016-04-12 ENCOUNTER — Ambulatory Visit (INDEPENDENT_AMBULATORY_CARE_PROVIDER_SITE_OTHER): Payer: 59 | Admitting: Osteopathic Medicine

## 2016-04-12 VITALS — BP 136/86 | HR 79 | Wt 248.0 lb

## 2016-04-12 DIAGNOSIS — N760 Acute vaginitis: Secondary | ICD-10-CM

## 2016-04-12 DIAGNOSIS — N3000 Acute cystitis without hematuria: Secondary | ICD-10-CM

## 2016-04-12 DIAGNOSIS — R350 Frequency of micturition: Secondary | ICD-10-CM

## 2016-04-12 DIAGNOSIS — B9689 Other specified bacterial agents as the cause of diseases classified elsewhere: Secondary | ICD-10-CM | POA: Diagnosis not present

## 2016-04-12 LAB — POCT URINALYSIS DIPSTICK
Bilirubin, UA: NEGATIVE
Blood, UA: NEGATIVE
GLUCOSE UA: NEGATIVE
KETONES UA: NEGATIVE
Nitrite, UA: NEGATIVE
Protein, UA: NEGATIVE
Urobilinogen, UA: 0.2
pH, UA: 5.5

## 2016-04-12 MED ORDER — SULFAMETHOXAZOLE-TRIMETHOPRIM 800-160 MG PO TABS: 1.0000 | ORAL_TABLET | Freq: Two times a day (BID) | ORAL | 0 refills | Status: DC

## 2016-04-12 NOTE — Patient Instructions (Signed)
We'll call you with results of vaginal swab and urine culture. If you are not feeling any better with treatment, and his testing is all negative, we will need to send referral most likely to urology for further workup.   Please let us know if you have any other questions or concerns!

## 2016-04-12 NOTE — Progress Notes (Addendum)
Chief Complaint: Possible UTI  History of Present Illness: THANIA DEWAELE is a 45 y.o. female who presents to [place]  today with concerns for Chief Complaint  Patient presents with  . Urinary Frequency    Onset: 3 weeks ago  Quality: Frequency and Urgency Associated Symptoms: see ROS below Context:  Previous UTI: negative Ucx 1 year ago  Recurrent UTI (3 times/more annually): no  Abx in past 3 months: no    Past medical, social and family history reviewed: Past Medical History:  Diagnosis Date  . Achilles tendon injury   . ADD (attention deficit disorder)   . Depression   . IBS (irritable bowel syndrome)   . Migraine   . Obese   . Ovarian cyst   . PMDD (premenstrual dysphoric disorder)   . Vaso-vagal reaction    Past Surgical History:  Procedure Laterality Date  . LAPAROSCOPIC CHOLECYSTECTOMY  2011   Social History  Substance Use Topics  . Smoking status: Never Smoker  . Smokeless tobacco: Never Used  . Alcohol use Yes     Comment: 1/ in 6 months   The patient has a family history of  Current Outpatient Prescriptions  Medication Sig Dispense Refill  . ALPRAZolam (XANAX) 0.5 MG tablet Take 0.5-1 tablets (0.25-0.5 mg total) by mouth daily as needed. 15 tablet 3  . cetirizine (ZYRTEC) 10 MG tablet Take 10 mg by mouth daily as needed for allergies.    . citalopram (CELEXA) 40 MG tablet Take 1 tablet (40 mg total) by mouth daily. 90 tablet 1  . fluticasone (FLOVENT HFA) 110 MCG/ACT inhaler Inhale 2 puffs into the lungs 2 (two) times daily. For cough 1 Inhaler 0  . Iron-FA-B Cmp-C-Biot-Probiotic (FUSION PLUS) CAPS Take 1 capsule by mouth daily. 30 capsule 5  . Ivermectin (SOOLANTRA) 1 % CREA Apply 1 application topically daily.    Marland Kitchen levonorgestrel-ethinyl estradiol (SEASONALE,INTROVALE,JOLESSA) 0.15-0.03 MG tablet TAKE 1 TABLET BY MOUTH  DAILY 91 tablet 4  . methylphenidate (RITALIN) 20 MG tablet Take 1 tablet (20 mg total) by  mouth daily as needed (for additional concentration). Ok to fill 04/25/2016 30 tablet 0  . omeprazole (PRILOSEC) 40 MG capsule Take 40 mg by mouth daily.     No current facility-administered medications for this visit.    Allergies  Allergen Reactions  . Zofran [Ondansetron Hcl]     Migraine      Review of Systems: CONSTITUTIONAL: Negative fever/chills CARDIAC: No chest pain/pressure/palpitations, no orthopnea RESPIRATORY: No cough/shortness of breath/wheeze GASTROINTESTINAL: No nausea/vomiting/abdominal pain/blood in stool/diarrhea/constipation MUSCULOSKELETAL: see below re: flank pain GENITOURINARY:    Frequency: yes  Hematuria: no  Odor: no  Incontinence: no  Flank Pain: no  Vaginal bleeding/discharge: no  Exam:  BP 136/86   Pulse 79   Wt 248 lb (112.5 kg)   BMI 38.84 kg/m  Constitutional: VSS, see above. General Appearance: alert, well-developed, well-nourished, NAD Respiratory: Normal respiratory effort. Breath sounds normal, no wheeze/rhonchi/rales Cardiovascular: S1/S2 normal, no murmur/rub/gallop auscultated. RRR Gastrointestinal: Nontender, no masses. No hepatomegaly, no splenomegaly. No hernia appreciated. Rectal exam deferred.  Musculoskeletal: Gait normal. No clubbing/cyanosis of digits. Lloyd sign Negative bilateral GYN: Normal urethra, normal vaginal me to set a, physiologic discharge. No prolapse. No uterine tenderness, patient reports some right adnexal tenderness on exam  Results for orders placed or performed in visit on 04/12/16 (from the past 24 hour(s))  POCT Urinalysis Dipstick     Status: Abnormal   Collection Time: 04/12/16  3:16 PM  Result Value Ref Range   Color, UA yellow    Clarity, UA clear    Glucose, UA negative    Bilirubin, UA negative    Ketones, UA negative    Spec Grav, UA >=1.030    Blood, UA negative    pH, UA 5.5    Protein, UA negative    Urobilinogen, UA 0.2    Nitrite, UA negative    Leukocytes, UA small (1+) (A)  Negative    Previous Culture Results: negative   ASSESSMENT/PLAN: Symptoms of frequency without dysuria not quite classic for UTI, however we'll go ahead and treat given duration of symptoms and given relatively normal physical exam. Patient is on birth control, unlikely mittelschmerz pain but will consider pelvic ultrasound if no improvement. Consider urology referral if no improvement and normal ultrasound. Prolapse does not appear to be an issue. Recommend follow-up with PCP if symptoms fail to resolve.  Urinary frequency - Plan: POCT Urinalysis Dipstick, Urine culture, WET PREP FOR TRICH, YEAST, CLUE, Urine culture  Acute cystitis without hematuria - Plan: sulfamethoxazole-trimethoprim (BACTRIM DS) 800-160 MG tablet  Bacterial vaginitis - Plan: metroNIDAZOLE (FLAGYL) 500 MG tablet  Patient Instructions  We'll call you with results of vaginal swab and urine culture. If you are not feeling any better with treatment, and his testing is all negative, we will need to send referral most likely to urology for further workup.   Please let us know if you have any other questions or concerns!   Patient advised we will call with urine culture results once available, depending on results may need to change therapy. Return if symptoms worsen or fail to improve.

## 2016-04-13 LAB — WET PREP FOR TRICH, YEAST, CLUE
Trich, Wet Prep: NONE SEEN
Yeast Wet Prep HPF POC: NONE SEEN

## 2016-04-13 MED ORDER — METRONIDAZOLE 500 MG PO TABS: 500.0000 mg | ORAL_TABLET | Freq: Two times a day (BID) | ORAL | 0 refills | Status: AC

## 2016-04-13 NOTE — Addendum Note (Signed)
Addended by: Maryla Morrow on: 04/13/2016 12:41 PM   Modules accepted: Orders

## 2016-04-15 LAB — URINE CULTURE

## 2016-04-23 DIAGNOSIS — R109 Unspecified abdominal pain: Secondary | ICD-10-CM | POA: Insufficient documentation

## 2016-04-23 DIAGNOSIS — N1330 Unspecified hydronephrosis: Secondary | ICD-10-CM | POA: Insufficient documentation

## 2016-04-23 DIAGNOSIS — D649 Anemia, unspecified: Secondary | ICD-10-CM | POA: Insufficient documentation

## 2016-05-16 ENCOUNTER — Emergency Department
Admission: EM | Admit: 2016-05-16 | Discharge: 2016-05-16 | Disposition: A | Discharge status: Home / Self Care | Payer: 59 | Source: Home / Self Care | Attending: Family Medicine | Admitting: Family Medicine

## 2016-05-16 ENCOUNTER — Other Ambulatory Visit: Payer: Self-pay | Admitting: Emergency Medicine

## 2016-05-16 ENCOUNTER — Encounter: Payer: Self-pay | Admitting: Emergency Medicine

## 2016-05-16 DIAGNOSIS — A09 Infectious gastroenteritis and colitis, unspecified: Secondary | ICD-10-CM

## 2016-05-16 DIAGNOSIS — R112 Nausea with vomiting, unspecified: Secondary | ICD-10-CM

## 2016-05-16 DIAGNOSIS — R197 Diarrhea, unspecified: Secondary | ICD-10-CM

## 2016-05-16 MED ORDER — PROMETHAZINE HCL 25 MG PO TABS: 25.0000 mg | ORAL_TABLET | Freq: Four times a day (QID) | ORAL | 0 refills | Status: DC | PRN

## 2016-05-16 MED ORDER — CIPROFLOXACIN HCL 500 MG PO TABS: 500.0000 mg | ORAL_TABLET | Freq: Two times a day (BID) | ORAL | 0 refills | Status: DC

## 2016-05-16 NOTE — Discharge Instructions (Signed)
°  You are being treated with an antibiotic most commonly used for "traveler's diarrhea" to help treat for the most common bacterial cause of diarrhea after traveling outside of the country.  Please try to provide a stool sample prior to starting the antibiotic so an accurate test can be performed.  Depending on the bacteria found in the stool culture test, additional antibiotics or longer duration of treatment may be indicated.

## 2016-05-16 NOTE — ED Triage Notes (Signed)
Pt has traveled home from [place] yesterday. States she developed N+V+D x3 days ago. Reports up to 30 times daily of vomiting and diarrhea. Denies eating anything x2 days and very little water intake.

## 2016-05-16 NOTE — ED Provider Notes (Signed)
CSN: [id number]     Arrival date & time 05/16/16  1426 History   First MD Initiated Contact with Patient 05/16/16 1443     Chief Complaint  Patient presents with  . Nausea   (Consider location/radiation/quality/duration/timing/severity/associated sxs/prior Treatment) HPI Kristina Martinez is a 45 y.o. female presenting to UC with c/o 3 days of abdominal cramping, nausea, vomiting, and diarrhea.  She last vomiting/dry heaved this morning but has had about 20-30 episodes of watery "grainy looking" diarrhea today.  She has not been able to eat or drink anything for 2 days due to the vomiting and diarrhea. She did try dramamine and pepto-bismol but she states she felt like both just caused worsening symptoms. She was in the [place] for 7 days, symptoms started on the 5th day. She just got back yesterday. Her husband also traveled with her but he has only had mild stomach upset.  Pt denies fever but has had chills. No urinary symptoms.    Past Medical History:  Diagnosis Date  . Achilles tendon injury   . ADD (attention deficit disorder)   . Depression   . IBS (irritable bowel syndrome)   . Migraine   . Obese   . Ovarian cyst   . PMDD (premenstrual dysphoric disorder)   . Vaso-vagal reaction    Past Surgical History:  Procedure Laterality Date  . LAPAROSCOPIC CHOLECYSTECTOMY  2011   Family History  Problem Relation Age of Onset  . Skin cancer Mother     skin  . Prostate cancer Father   . Heart disease Father     AMI  . Heart failure Father   . Stroke Brother 40  . Breast cancer Maternal Grandmother   . Uterine cancer Maternal Grandmother     ? cervix  . Breast cancer Paternal Grandmother    Social History  Substance Use Topics  . Smoking status: Never Smoker  . Smokeless tobacco: Never Used  . Alcohol use Yes     Comment: 1/ in 6 months   OB History    No data available     Review of Systems  Constitutional: Positive for appetite change, chills and fatigue.  Negative for fever.  HENT: Negative for congestion, ear pain, sore throat, trouble swallowing and voice change.   Respiratory: Negative for cough and shortness of breath.   Cardiovascular: Negative for chest pain and palpitations.  Gastrointestinal: Positive for abdominal pain ( diffuse cramping), diarrhea, nausea and vomiting. Negative for blood in stool.  Genitourinary: Positive for decreased urine volume ( slight). Negative for dysuria, frequency, hematuria and urgency.  Musculoskeletal: Negative for arthralgias, back pain and myalgias.  Skin: Negative for rash.  Neurological: Negative for dizziness, light-headedness and headaches.    Allergies  Zofran [ondansetron hcl]  Home Medications   Prior to Admission medications   Medication Sig Start Date End Date Taking? Authorizing Provider  ALPRAZolam Duanne Moron) 0.5 MG tablet Take 0.5-1 tablets (0.25-0.5 mg total) by mouth daily as needed. 02/26/16   Hali Marry, MD  cetirizine (ZYRTEC) 10 MG tablet Take 10 mg by mouth daily as needed for allergies.    Historical Provider, MD  ciprofloxacin (CIPRO) 500 MG tablet Take 1 tablet (500 mg total) by mouth 2 (two) times daily. One po bid x 7 days 05/16/16   Noland Fordyce, PA-C  citalopram (CELEXA) 40 MG tablet Take 1 tablet (40 mg total) by mouth daily. 02/26/16   Hali Marry, MD  fluticasone (FLOVENT HFA) 110 MCG/ACT inhaler Inhale  2 puffs into the lungs 2 (two) times daily. For cough 12/04/15   Emeterio Reeve, DO  Iron-FA-B Cmp-C-Biot-Probiotic (FUSION PLUS) CAPS Take 1 capsule by mouth daily. 02/26/16   Hali Marry, MD  Ivermectin (SOOLANTRA) 1 % CREA Apply 1 application topically daily.    Historical Provider, MD  levonorgestrel-ethinyl estradiol (SEASONALE,INTROVALE,JOLESSA) 0.15-0.03 MG tablet TAKE 1 TABLET BY MOUTH  DAILY 04/08/16   Hali Marry, MD  methylphenidate (RITALIN) 20 MG tablet Take 1 tablet (20 mg total) by mouth daily as needed (for additional  concentration). Ok to fill 04/25/2016 02/26/16   Hali Marry, MD  omeprazole (PRILOSEC) 40 MG capsule Take 40 mg by mouth daily.    Historical Provider, MD  promethazine (PHENERGAN) 25 MG tablet Take 1 tablet (25 mg total) by mouth every 6 (six) hours as needed for nausea or vomiting. 05/16/16   Noland Fordyce, PA-C  sulfamethoxazole-trimethoprim (BACTRIM DS) 800-160 MG tablet Take 1 tablet by mouth 2 (two) times daily. 04/12/16   Emeterio Reeve, DO   Meds Ordered and Administered this Visit  Medications - No data to display  BP 126/84 (BP Location: Right Arm)   Pulse 84   Temp 97.9 F (36.6 C) (Oral)   Wt 239 lb (108.4 kg)   SpO2 98%   BMI 37.43 kg/m  No data found.   Physical Exam  Constitutional: She appears well-developed and well-nourished. No distress.  Pt sitting on exam bed, appears mildly sweaty but is alert, oriented, cooperative during exam. NAD.  HENT:  Head: Normocephalic and atraumatic.  Right Ear: Tympanic membrane normal.  Left Ear: Tympanic membrane normal.  Nose: Nose normal.  Mouth/Throat: Uvula is midline, oropharynx is clear and moist and mucous membranes are normal.  Eyes: Conjunctivae are normal. No scleral icterus.  Neck: Normal range of motion. Neck supple.  Cardiovascular: Normal rate, regular rhythm and normal heart sounds.   Pulmonary/Chest: Effort normal and breath sounds normal. No respiratory distress. She has no wheezes. She has no rales.  Abdominal: Soft. Bowel sounds are normal. She exhibits no distension and no mass. There is tenderness ( diffuse). There is no rebound and no guarding.  Musculoskeletal: Normal range of motion.  Neurological: She is alert.  Skin: Skin is warm. She is diaphoretic.  Nursing note and vitals reviewed.   Urgent Care Course   Clinical Course     Procedures (including critical care time)  Labs Review Labs Reviewed  GASTROINTESTINAL PANEL BY PCR, STOOL (REPLACES STOOL CULTURE)    Imaging Review No  results found.    MDM   1. Nausea vomiting and diarrhea   2. Traveler's diarrhea    Pt c/o severe n/v/d after being in the [place] last week. Up to 30 episodes of watery diarrhea each day w/o blood in BMs.  Vomiting seems to have subsided, however, pt has not been able to eat for 2 days. Pt was able to keep down several ounces of Ginger-ale while in UC.  Will get stool culture but also start pt on empiric antibiotic treatment for Traveler's diarrhea.  Encouraged to provide stool sample prior to starting antibiotics if possible to help prevent skewing culture results. Rx: Phenergan and Cipro 500mg  BID for 3 days (may need to add antibiotic or increase duration depending on stool culture)  Discussed symptoms that warrant emergent care in the ED. Patient and husband verbalized understanding and agreement with treatment plan.     Noland Fordyce, PA-C 05/16/16 [phone]

## 2016-05-17 ENCOUNTER — Telehealth: Payer: Self-pay | Admitting: Emergency Medicine

## 2016-05-17 NOTE — Telephone Encounter (Signed)
Called [facility], Cipro should be for 3 days not 7 as per Dr Assunta Found.

## 2016-05-18 ENCOUNTER — Telehealth: Payer: Self-pay | Admitting: *Deleted

## 2016-05-18 LAB — GASTROINTESTINAL PATHOGEN PANEL PCR
C. difficile Tox A/B, PCR: NOT DETECTED
Campylobacter, PCR: NOT DETECTED
Cryptosporidium, PCR: NOT DETECTED
E coli (ETEC) LT/ST PCR: DETECTED — CR
E coli (STEC) stx1/stx2, PCR: NOT DETECTED
E coli 0157, PCR: NOT DETECTED
Giardia lamblia, PCR: NOT DETECTED
Norovirus, PCR: NOT DETECTED
Rotavirus A, PCR: NOT DETECTED
Salmonella, PCR: NOT DETECTED
Shigella, PCR: NOT DETECTED

## 2016-05-18 NOTE — Telephone Encounter (Signed)
lvm asking that pt return call Elouise Munroe

## 2016-05-18 NOTE — Telephone Encounter (Signed)
Pt lvm stated that she was seen in UC on Sunday for travelers diarrhea and was given Cipro. She reports that she now has hives and wanted advice.Marland KitchenMarland KitchenAudelia Hives Martinez

## 2016-05-18 NOTE — Telephone Encounter (Signed)
LM to call back for stool culture results. Charna Archer, LPN

## 2016-05-19 ENCOUNTER — Telehealth: Payer: Self-pay | Admitting: *Deleted

## 2016-05-19 NOTE — Telephone Encounter (Signed)
LM to call back for stool culture results.

## 2016-06-02 ENCOUNTER — Encounter: Payer: Self-pay | Admitting: Family Medicine

## 2016-06-02 ENCOUNTER — Ambulatory Visit (INDEPENDENT_AMBULATORY_CARE_PROVIDER_SITE_OTHER): Payer: 59 | Admitting: Family Medicine

## 2016-06-02 VITALS — BP 138/80 | HR 80 | Ht 67.0 in | Wt 245.0 lb

## 2016-06-02 DIAGNOSIS — N200 Calculus of kidney: Secondary | ICD-10-CM

## 2016-06-02 DIAGNOSIS — R911 Solitary pulmonary nodule: Secondary | ICD-10-CM | POA: Diagnosis not present

## 2016-06-02 NOTE — Patient Instructions (Signed)
Repeat chest CT in 1 year, 03/2017.

## 2016-06-02 NOTE — Progress Notes (Signed)
   Subjective:    Patient ID: Kristina Martinez, female    DOB: November 26, 1970, 45 y.o.   MRN: [id number]  HPI 45 year old female who recently was seen in the emergency department on 04/23/2016 for a kidney stone along with right hydronephrosis. Since then she has been seen as an outpatient with Dr. Harrison Mons, urology. They did do a repeat CT abdomen and pelvis and the calculus in the right UVJ was no longer present. She was noted to have a subpleural nodule in the right middle lobe of the lung measuring 5 mm.  He has never been a smoker.  She has a bit of a cold now with some nasal congestion and cough but she is actually getting better.  Review of Systems     Objective:   Physical Exam  Constitutional: She appears well-developed and well-nourished.  Cardiovascular: Normal rate, regular rhythm and normal heart sounds.   Pulmonary/Chest: Effort normal and breath sounds normal.  Psychiatric: She has a normal mood and affect.          Assessment & Plan:  Kidney stone-she has passed the stone on her own and is following with urology.  Right pulmonary nodule-we discussed pros and cons of follow-up. She is actually technically low risk so because the lesion is 5 mm we do not have to do any additional workup if she would prefer to have a repeat CT in one year.

## 2016-06-16 ENCOUNTER — Ambulatory Visit (INDEPENDENT_AMBULATORY_CARE_PROVIDER_SITE_OTHER): Payer: 59 | Admitting: Physician Assistant

## 2016-06-16 ENCOUNTER — Encounter: Payer: Self-pay | Admitting: Physician Assistant

## 2016-06-16 VITALS — BP 152/90 | HR 99 | Ht 67.0 in | Wt 237.0 lb

## 2016-06-16 DIAGNOSIS — R112 Nausea with vomiting, unspecified: Secondary | ICD-10-CM | POA: Diagnosis not present

## 2016-06-16 DIAGNOSIS — A09 Infectious gastroenteritis and colitis, unspecified: Secondary | ICD-10-CM | POA: Diagnosis not present

## 2016-06-16 LAB — CBC WITH DIFFERENTIAL/PLATELET
BASOS PCT: 1 %
Basophils Absolute: 70 cells/uL (ref 0–200)
EOS ABS: 490 {cells}/uL (ref 15–500)
Eosinophils Relative: 7 %
HCT: 33.1 % — ABNORMAL LOW (ref 35.0–45.0)
Hemoglobin: 9.9 g/dL — ABNORMAL LOW (ref 11.7–15.5)
LYMPHS ABS: 1890 {cells}/uL (ref 850–3900)
Lymphocytes Relative: 27 %
MCH: 20.5 pg — ABNORMAL LOW (ref 27.0–33.0)
MCHC: 29.9 g/dL — ABNORMAL LOW (ref 32.0–36.0)
MCV: 68.5 fL — AB (ref 80.0–100.0)
MONOS PCT: 6 %
MPV: 10.3 fL (ref 7.5–12.5)
Monocytes Absolute: 420 cells/uL (ref 200–950)
NEUTROS ABS: 4130 {cells}/uL (ref 1500–7800)
Neutrophils Relative %: 59 %
PLATELETS: 431 10*3/uL — AB (ref 140–400)
RBC: 4.83 MIL/uL (ref 3.80–5.10)
RDW: 18.6 % — ABNORMAL HIGH (ref 11.0–15.0)
WBC: 7 10*3/uL (ref 3.8–10.8)

## 2016-06-16 LAB — COMPLETE METABOLIC PANEL WITH GFR
ALT: 15 U/L (ref 6–29)
AST: 21 U/L (ref 10–35)
Albumin: 3.3 g/dL — ABNORMAL LOW (ref 3.6–5.1)
Alkaline Phosphatase: 95 U/L (ref 33–115)
BILIRUBIN TOTAL: 0.3 mg/dL (ref 0.2–1.2)
BUN: 9 mg/dL (ref 7–25)
CO2: 19 mmol/L — AB (ref 20–31)
CREATININE: 0.66 mg/dL (ref 0.50–1.10)
Calcium: 8.7 mg/dL (ref 8.6–10.2)
Chloride: 105 mmol/L (ref 98–110)
GFR, Est Non African American: >89 mL/min (ref >=60)
GLUCOSE: 162 mg/dL — AB (ref 65–99)
Potassium: 3.8 mmol/L (ref 3.5–5.3)
SODIUM: 137 mmol/L (ref 135–146)
TOTAL PROTEIN: 6 g/dL — AB (ref 6.1–8.1)

## 2016-06-16 MED ORDER — PROMETHAZINE HCL 25 MG PO TABS: 25.0000 mg | ORAL_TABLET | Freq: Four times a day (QID) | ORAL | 1 refills | Status: DC | PRN

## 2016-06-16 NOTE — Progress Notes (Signed)
   Subjective:    Patient ID: Kristina Martinez, female    DOB: 02/14/1971, 45 y.o.   MRN: [id number]  HPI Pt is a 45 yo female who presents to the clinic with ongoing nausea and vomiting since being treated for ecoli in stool after trip to [place]. She returned for trip on 11/18. After one week of diarrhea she went to UC and was given cipro. She finished her course of cipro. Diarrhea as resolved but she still has nausea and episodes of vomiting. Her bowel movements are soft and regular. She denies any melena or hematochezia. She has no abodominal pain or flank pain. She denies any association with food. She has not had any problems with reflux. She denies any fever, chills, body aches. She has not started any new medications or made any diet changes. Today she feels great. She continues to take PPI.    Review of Systems See HPI.     Objective:   Physical Exam  Constitutional: She is oriented to person, place, and time. She appears well-developed and well-nourished.  HENT:  Head: Normocephalic and atraumatic.  Neck: Normal range of motion. Neck supple. No thyromegaly present.  Cardiovascular: Normal rate, regular rhythm and normal heart sounds.   Pulmonary/Chest: Effort normal and breath sounds normal.  No CVA tenderness.   Abdominal: Soft. Bowel sounds are normal. She exhibits no distension and no mass. There is tenderness. There is no rebound and no guarding.  Very mild epigastric tenderness to palpation. No guarding or rebound.   Lymphadenopathy:    She has no cervical adenopathy.  Neurological: She is alert and oriented to person, place, and time.  Psychiatric: She has a normal mood and affect. Her behavior is normal.          Assessment & Plan:  Marland KitchenMarland KitchenLatonja was seen today for emesis.  Diagnoses and all orders for this visit:  Nausea and vomiting in adult -     promethazine (PHENERGAN) 25 MG tablet; Take 1 tablet (25 mg total) by mouth every 6 (six) hours as needed for  nausea or vomiting. -     Ova and parasite examination -     Cancel: Gastrointestinal Pathogen Panel PCR -     CBC with Differential/Platelet -     COMPLETE METABOLIC PANEL WITH GFR  Diarrhea of infectious origin -     Ova and parasite examination -     Cancel: Gastrointestinal Pathogen Panel PCR -     CBC with Differential/Platelet -     COMPLETE METABOLIC PANEL WITH GFR   Diarrhea has resolved. No significant pain to evaluate. suspicious for gastric ulcer/h.pylori cannot do breath test in office due to PPI usage.  Will check labs and recheck stool sample.  If symptoms continue consider GI referral for possible endoscopy to evaluate for h.pylori.

## 2016-07-26 ENCOUNTER — Other Ambulatory Visit: Payer: Self-pay | Admitting: Family Medicine

## 2016-07-26 DIAGNOSIS — Z23 Encounter for immunization: Secondary | ICD-10-CM

## 2016-07-27 ENCOUNTER — Other Ambulatory Visit: Payer: Self-pay | Admitting: *Deleted

## 2016-07-27 DIAGNOSIS — F988 Other specified behavioral and emotional disorders with onset usually occurring in childhood and adolescence: Secondary | ICD-10-CM

## 2016-07-27 MED ORDER — METHYLPHENIDATE HCL 20 MG PO TABS: 20.0000 mg | ORAL_TABLET | Freq: Every day | ORAL | 0 refills | Status: DC | PRN

## 2016-09-01 ENCOUNTER — Other Ambulatory Visit: Payer: Self-pay | Admitting: Family Medicine

## 2016-09-03 ENCOUNTER — Encounter: Payer: Self-pay | Admitting: *Deleted

## 2016-09-03 ENCOUNTER — Emergency Department (INDEPENDENT_AMBULATORY_CARE_PROVIDER_SITE_OTHER)
Admission: EM | Admit: 2016-09-03 | Discharge: 2016-09-03 | Disposition: A | Discharge status: Home / Self Care | Payer: 59 | Source: Home / Self Care | Attending: Family Medicine | Admitting: Family Medicine

## 2016-09-03 DIAGNOSIS — R300 Dysuria: Secondary | ICD-10-CM

## 2016-09-03 DIAGNOSIS — Z87442 Personal history of urinary calculi: Secondary | ICD-10-CM

## 2016-09-03 DIAGNOSIS — R310 Gross hematuria: Secondary | ICD-10-CM

## 2016-09-03 LAB — POCT URINALYSIS DIP (MANUAL ENTRY)
Glucose, UA: NEGATIVE
Leukocytes, UA: NEGATIVE
Nitrite, UA: NEGATIVE
Protein Ur, POC: 100 — AB
Spec Grav, UA: >=1.03 (ref 1.005–1.03)
Urobilinogen, UA: 1 (ref 0–1)
pH, UA: 5.5 (ref 5–8)

## 2016-09-03 NOTE — ED Provider Notes (Signed)
CSN: [id number]     Arrival date & time 09/03/16  1953 History   First MD Initiated Contact with Patient 09/03/16 2006     Chief Complaint  Patient presents with  . Dysuria   (Consider location/radiation/quality/duration/timing/severity/associated sxs/prior Treatment) HPI Kristina Martinez is a 46 y.o. female presenting to UC with c/o 1 day of urinary frequency, mild urinary incontinence, and intermittent gross hematuria.  Hx of kidney stones, symptoms feel similar to last time. Denies abdominal pain, back pain, fever, chills, n/v/d. She has not tried anything for her symptoms yet.    Past Medical History:  Diagnosis Date  . Achilles tendon injury   . ADD (attention deficit disorder)   . Depression   . IBS (irritable bowel syndrome)   . Migraine   . Obese   . Ovarian cyst   . PMDD (premenstrual dysphoric disorder)   . Vaso-vagal reaction    Past Surgical History:  Procedure Laterality Date  . LAPAROSCOPIC CHOLECYSTECTOMY  2011   Family History  Problem Relation Age of Onset  . Skin cancer Mother     skin  . Prostate cancer Father   . Heart disease Father     AMI  . Heart failure Father   . Stroke Brother 40  . Breast cancer Maternal Grandmother   . Uterine cancer Maternal Grandmother     ? cervix  . Breast cancer Paternal Grandmother    Social History  Substance Use Topics  . Smoking status: Never Smoker  . Smokeless tobacco: Never Used  . Alcohol use Yes     Comment: 1/ in 6 months   OB History    No data available     Review of Systems  Constitutional: Negative for chills and fever.  Gastrointestinal: Negative for abdominal pain, diarrhea, nausea and vomiting.  Genitourinary: Positive for dysuria, frequency, hematuria and urgency. Negative for pelvic pain.  Musculoskeletal: Negative for back pain.    Allergies  Zofran [ondansetron hcl]  Home Medications   Prior to Admission medications   Medication Sig Start Date End Date Taking? Authorizing Provider   ALPRAZolam Duanne Moron) 0.5 MG tablet TAKE 1/2 TO 1 TABLET BY MOUTH DAILY AS NEEDED 09/01/16   Hali Marry, MD  cetirizine (ZYRTEC) 10 MG tablet Take 10 mg by mouth daily as needed for allergies.    Historical Provider, MD  citalopram (CELEXA) 40 MG tablet Take 1 tablet (40 mg total) by mouth daily. 02/26/16   Hali Marry, MD  fluticasone (FLOVENT HFA) 110 MCG/ACT inhaler Inhale 2 puffs into the lungs 2 (two) times daily. For cough 12/04/15   Emeterio Reeve, DO  Iron-FA-B Cmp-C-Biot-Probiotic (FUSION PLUS) CAPS Take 1 capsule by mouth daily. 02/26/16   Hali Marry, MD  levonorgestrel-ethinyl estradiol (SEASONALE,INTROVALE,JOLESSA) 0.15-0.03 MG tablet TAKE 1 TABLET BY MOUTH  DAILY 04/08/16   Hali Marry, MD  methylphenidate (RITALIN) 20 MG tablet Take 1 tablet (20 mg total) by mouth daily as needed (for additional concentration). Ok to fill 09/22/2016 07/27/16   Hali Marry, MD  omeprazole (PRILOSEC) 40 MG capsule Take 40 mg by mouth daily.    Historical Provider, MD  promethazine (PHENERGAN) 25 MG tablet Take 1 tablet (25 mg total) by mouth every 6 (six) hours as needed for nausea or vomiting. 06/16/16   Donella Stade, PA-C   Meds Ordered and Administered this Visit  Medications - No data to display  BP 160/90 (BP Location: Left Arm)   Pulse 100   Temp  98.4 F (36.9 C) (Oral)   Resp 18   Ht 5\' 7"  (1.702 m)   Wt 240 lb (108.9 kg)   LMP 07/06/2016   SpO2 97%   BMI 37.59 kg/m  No data found.   Physical Exam  Constitutional: She is oriented to person, place, and time. She appears well-developed and well-nourished. No distress.  Pt sitting comfortably on exam bed, NAD.   HENT:  Head: Normocephalic and atraumatic.  Mouth/Throat: Oropharynx is clear and moist.  Eyes: EOM are normal.  Neck: Normal range of motion. Neck supple.  Cardiovascular: Normal rate and regular rhythm.   Pulmonary/Chest: Effort normal and breath sounds normal.  Abdominal:  Soft. She exhibits no distension. There is no tenderness. There is no CVA tenderness.  Musculoskeletal: Normal range of motion.  Neurological: She is alert and oriented to person, place, and time.  Skin: Skin is warm and dry. She is not diaphoretic.  Psychiatric: She has a normal mood and affect. Her behavior is normal.  Nursing note and vitals reviewed.   Urgent Care Course     Procedures (including critical care time)  Labs Review Labs Reviewed  POCT URINALYSIS DIP (MANUAL ENTRY) - Abnormal; Notable for the following:       Result Value   Bilirubin, UA small (*)    Ketones, POC UA trace (5) (*)    Blood, UA moderate (*)    Protein Ur, POC =100 (*)    All other components within normal limits  URINE CULTURE    Imaging Review No results found.    MDM   1. Dysuria   2. Gross hematuria   3. History of kidney stones    Pt c/o urinary symptoms c/w last time she had kidney stones.  She did not need any medication at that time, stones passed on their own.  UA: no evidence of UTI, blood noted in urine.  Encouraged good hydration. May try Naproxen and pyridium to help with urinary discomfort. F/u with urology next week if not improving.     Noland Fordyce, PA-C 09/04/16 [phone]

## 2016-09-03 NOTE — ED Triage Notes (Signed)
Pt urinary frequency, incontinence and hematuria x 1 day. Denies fever.

## 2016-09-03 NOTE — Discharge Instructions (Signed)
°  You may try over the counter medication called Azo to help with bladder spasms.  This medication can make your urine orange, which is normal.   You may try over the counter naproxen or ibuprofen as needed for pain.

## 2016-09-05 ENCOUNTER — Telehealth: Payer: Self-pay | Admitting: Emergency Medicine

## 2016-09-05 LAB — URINE CULTURE

## 2016-09-05 NOTE — Telephone Encounter (Signed)
Inquired about patient's status; encourage them to call with questions/concerns.Informed that urine culture was negative, as Kristina Martinez had suspected. Encouraged her to follow with Dr.Metheney.

## 2016-09-29 ENCOUNTER — Ambulatory Visit (INDEPENDENT_AMBULATORY_CARE_PROVIDER_SITE_OTHER): Payer: 59

## 2016-09-29 ENCOUNTER — Ambulatory Visit (INDEPENDENT_AMBULATORY_CARE_PROVIDER_SITE_OTHER): Payer: 59 | Admitting: Sports Medicine

## 2016-09-29 ENCOUNTER — Encounter: Payer: Self-pay | Admitting: Sports Medicine

## 2016-09-29 DIAGNOSIS — M67912 Unspecified disorder of synovium and tendon, left shoulder: Secondary | ICD-10-CM

## 2016-09-29 DIAGNOSIS — M25512 Pain in left shoulder: Secondary | ICD-10-CM

## 2016-09-29 MED ORDER — MELOXICAM 15 MG PO TABS: ORAL_TABLET | ORAL | 3 refills | Status: DC

## 2016-09-29 NOTE — Assessment & Plan Note (Signed)
Meloxicam, formal physical therapy, x-rays. Return in one month, injection if no better.

## 2016-09-29 NOTE — Progress Notes (Signed)
   Subjective:    I'm seeing this patient as a consultation for:  Dr. Beatrice Lecher  CC: Left shoulder pain  HPI: For the past several weeks this pleasant 46 year old female has had pain that she localizes over the deltoid, no radiation, moderate, persistent, worse with abduction of shoulder. No trauma, it does wake her from sleep.  Past medical history:  Negative.  See flowsheet/record as well for more information.  Surgical history: Negative.  See flowsheet/record as well for more information.  Family history: Negative.  See flowsheet/record as well for more information.  Social history: Negative.  See flowsheet/record as well for more information.  Allergies, and medications have been entered into the medical record, reviewed, and no changes needed.   Review of Systems: No headache, visual changes, nausea, vomiting, diarrhea, constipation, dizziness, abdominal pain, skin rash, fevers, chills, night sweats, weight loss, swollen lymph nodes, body aches, joint swelling, muscle aches, chest pain, shortness of breath, mood changes, visual or auditory hallucinations.   Objective:   General: Well Developed, well nourished, and in no acute distress.  Neuro/Psych: Alert and oriented x3, extra-ocular muscles intact, able to move all 4 extremities, sensation grossly intact. Skin: Warm and dry, no rashes noted.  Respiratory: Not using accessory muscles, speaking in full sentences, trachea midline.  Cardiovascular: Pulses palpable, no extremity edema. Abdomen: Does not appear distended. Left Shoulder: Inspection reveals no abnormalities, atrophy or asymmetry. Palpation is normal with no tenderness over AC joint or bicipital groove. ROM is full in all planes. Rotator cuff strength normal throughout. Positive Neer and Hawkin's tests, empty can. Speeds and Yergason's tests normal. No labral pathology noted with negative Obrien's, negative crank, negative clunk, and good stability. Normal  scapular function observed. No painful arc and no drop arm sign. No apprehension sign  Impression and Recommendations:   This case required medical decision making of moderate complexity.  Dysfunction of left rotator cuff Meloxicam, formal physical therapy, x-rays. Return in one month, injection if no better.

## 2016-10-13 ENCOUNTER — Ambulatory Visit (INDEPENDENT_AMBULATORY_CARE_PROVIDER_SITE_OTHER): Payer: 59 | Admitting: Rehabilitative and Restorative Service Providers"

## 2016-10-13 DIAGNOSIS — R293 Abnormal posture: Secondary | ICD-10-CM

## 2016-10-13 DIAGNOSIS — M25512 Pain in left shoulder: Secondary | ICD-10-CM

## 2016-10-13 DIAGNOSIS — R531 Weakness: Secondary | ICD-10-CM

## 2016-10-13 NOTE — Patient Instructions (Addendum)
Self massage using a 4 inch plastic ball   Shoulder Blade Squeeze    Rotate shoulders back, then squeeze shoulder blades down and back Hold 10-15 sec Repeat _10___ times. Do _several___ sessions per day.  Axial Extension (Chin Tuck)    Pull chin in and lengthen back of neck. Hold __10-15__ seconds while counting out loud. Repeat __5-10__ times. Do __several_ sessions per day.    Flexors Stretch, Standing    Stand about a thigh's length from table. Grip edges of table. Bend knees until stretch is felt under shoulder blades in chest. Hold _10__ seconds. Repeat _5__ times per session. Do __2-3_ sessions per day.    Flexors Stretch, Sitting (Passive)    Sit, forearm on table. Bend from waist and slide forearm forward along table. Hold _10__ seconds.  Repeat __5-10_ times per session. Do _2-3 __ sessions per day.   Siting by large exercise ball press hand down  Hold 3-5 sec no pain  Repeat 10-20  1-2 times/day  Rolling ball on table

## 2016-10-13 NOTE — Therapy (Signed)
[place], [place], [address] Phone: [phone]   Fax:  [phone]  Physical Therapy Evaluation  Patient Details  Name: Kristina Martinez MRN: [id number] Date of Birth: [birth year] Referring Provider: Ina Kick  Encounter Date: 10/13/2016      PT End of Session - 10/13/16 1212    Visit Number 1   Number of Visits 12   Date for PT Re-Evaluation 11/24/16   PT Start Time 1109   PT Stop Time 1212   PT Time Calculation (min) 63 min   Activity Tolerance Patient tolerated treatment well      Past Medical History:  Diagnosis Date  . Achilles tendon injury   . ADD (attention deficit disorder)   . Depression   . IBS (irritable bowel syndrome)   . Migraine   . Obese   . Ovarian cyst   . PMDD (premenstrual dysphoric disorder)   . Vaso-vagal reaction     Past Surgical History:  Procedure Laterality Date  . LAPAROSCOPIC CHOLECYSTECTOMY  2011    There were no vitals filed for this visit.       Subjective Assessment - 10/13/16 1108    Subjective Patient presents for evaluation of Lt shoulder pain and limited motion present for the past 5 months. She does not know of any accident or injury. Notices that she can't lift her arm out to the side. She has pain at rest, more at night.    Pertinent History denies musculoskeletal injuries or problems    How long can you sit comfortably? at rest - no limit; at computer 30 min    How long can you stand comfortably? no limit   How long can you walk comfortably? no limit    Diagnostic tests X-rays (-)    Patient Stated Goals mobility back in the Lt shoudler and no more pain    Currently in Pain? Yes   Pain Score 3    Pain Location Shoulder   Pain Orientation Left   Pain Descriptors / Indicators Aching   Pain Type Acute pain   Pain Radiating Towards into Lt side of neck in the past two weeks   Pain Onset More than a month ago   Pain Frequency  Intermittent   Aggravating Factors  lifting arm to side; working at computer; daily activity; worse toward the end of the day   Pain Relieving Factors OTC antiinflammatory; heat; avoiding activities that irritate symptoms             [facility] PT Assessment - 10/13/16 0001      Assessment   Medical Diagnosis Lt shoulder dysfunction    Referring Provider Dt Thekkekandam   Onset Date/Surgical Date 05/28/16   Hand Dominance Right   Next MD Visit 5/18   Prior Therapy no      Precautions   Precautions None     Balance Screen   Has the patient fallen in the past 6 months Yes   How many times? 1   Has the patient had a decrease in activity level because of a fear of falling?  No   Is the patient reluctant to leave their home because of a fear of falling?  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    Leisure household chores; computer work; soft sofa legs tucked up      Observation/Other Assessments   Focus on Therapeutic Outcomes (FOTO)  49% limitation      Sensation   Additional Comments WNL's per pt report      Posture/Postural Control   Posture Comments head forward; shoulders rounded and elevated; increased thoracic kyphosis; scapulae abducted and rotated along the thoracic wall     AROM   Right/Left Shoulder --  pain wit hall Lt shd ROM > w/ abduction; ER; IR    Right Shoulder Extension 60 Degrees   Right Shoulder Flexion 157 Degrees   Right Shoulder ABduction 164 Degrees   Right Shoulder Internal Rotation 35 Degrees   Right Shoulder External Rotation 90 Degrees   Left Shoulder Extension 42 Degrees   Left Shoulder Flexion 133 Degrees   Left Shoulder ABduction 60 Degrees   Left Shoulder Internal Rotation 32  Degrees   Left Shoulder External Rotation 53 Degrees   Cervical Flexion 65   Cervical Extension 62   Cervical - Right Side Bend 41   Cervical - Left Side Bend 40  discomfort Lt cervical    Cervical - Right Rotation 64   Cervical - Left Rotation 68  discomfort Lt cervical      Strength   Right/Left Shoulder --  Rt shoudler 5/5; Lt 3-/5 to 3/5 w/active mvt/pain w/MMT     Palpation   Spinal mobility hypomobile mid to upper thoracic and cervical spine    Palpation comment sifnigicant tightness noted through the ant/lat/post cervical spine; upper traps; leveator; pecs; teres; lats                    [facility] Adult PT Treatment/Exercise - 10/13/16 0001      Neuro Re-ed    Neuro Re-ed Details  working on posture and alignment engaging posterior shoudler girdle      Shoulder Exercises: Standing   Other Standing Exercises scap squeeze 10 sec x 10 w/ noodle    Other Standing Exercises axial extension 10 sec x 5     Shoulder Exercises: Pulleys   Flexion --  10 sec hold x 5   ABduction Limitations --  10 sec hold x 5 scaption      Shoulder Exercises: Therapy Ball   Other Therapy Ball Exercises sitting ball at side - scapular depressioin closed kinetic chain 5 sec x 10      Shoulder Exercises: Stretch   Other Shoulder Stretches shoulder flexion w/ball rolling forward; table slide; counter step back 3-5 reps each      Moist Heat Therapy   Number Minutes Moist Heat 15 Minutes   Moist Heat Location Shoulder  Lt     Electrical Stimulation   Electrical Stimulation Location Lt shoudler girdle    Electrical Stimulation Action IFC   Electrical Stimulation Parameters to tolerance   Electrical Stimulation Goals Pain;Tone     Manual Therapy   Manual Therapy --                PT Education - 10/13/16 1159    Education provided Yes   Education Details HEP    Person(s) Educated Patient   Methods Explanation;Demonstration;Tactile cues;Verbal cues;Handout    Comprehension Verbalized understanding;Returned demonstration;Verbal cues required;Tactile cues required             PT Long Term Goals - 10/13/16 1218  PT LONG TERM GOAL #1   Title Improve posture and alignment allowing patient to demonstrate upright posture and neutral position of shoudler girdle - improved scapular control 11/24/16    Time 6   Period Weeks   Status New     PT LONG TERM GOAL #2   Title Increase AROM Lt shoudler to equal or greater than AROM Rt shoudler - without Lt shoudler pain with ROM 11/24/16   Time 6   Period Weeks   Status New     PT LONG TERM GOAL #3   Title Patient reports return to normal functional activities at home and work 11/24/16   Time 6   Period Weeks   Status New     PT LONG TERM GOAL #4   Title Independent in HEP 11/24/16   Time 6   Period Weeks   Status New     PT LONG TERM GOAL #5   Title Improve FOTO to </= 31% limitation 11/24/16   Time 6   Period Weeks   Status New               Plan - 10/13/16 1213    Clinical Impression Statement Katasha presents for moderate complexity of Lt shoudler dysfunction. She reports pain in the Lt shoudler for the past 5 months with no known cause of injury. She has poor posture and alignment; scapular dyskinesis; decreased shoudler ROM; decresaed active movement/strength; decreased functional activity level; pain on a daily basis; inability to sleep due to pain. Patient will benefit form PT to address problems identified.    Rehab Potential Good   PT Frequency 2x / week   PT Duration 6 weeks   PT Treatment/Interventions Patient/family education;Neuromuscular re-education;ADLs/Self Care Home Management;Cryotherapy;Electrical Stimulation;Iontophoresis 4mg /ml Dexamethasone;Moist Heat;Ultrasound;Dry needling;Manual techniques;Therapeutic activities;Therapeutic exercise   PT Next Visit Plan progress with posterior shoudler girdle activition; add pec stretch as tolerated; manual work Lt shoudler  girdle; ROM; trial of DN as indicated    Consulted and Agree with Plan of Care Patient      Patient will benefit from skilled therapeutic intervention in order to improve the following deficits and impairments:  Postural dysfunction, Improper body mechanics, Pain, Decreased range of motion, Impaired UE functional use, Increased fascial restricitons, Increased muscle spasms, Decreased mobility, Decreased strength, Decreased activity tolerance  Visit Diagnosis: Acute pain of left shoulder - Plan: PT plan of care cert/re-cert  Abnormal posture - Plan: PT plan of care cert/re-cert  Weakness generalized - Plan: PT plan of care cert/re-cert     Problem List Patient Active Problem List   Diagnosis Date Noted  . Dysfunction of left rotator cuff 09/29/2016  . Pulmonary nodule, right 06/02/2016  . Needs flu shot 02/26/2016  . Primary osteoarthritis of left knee 01/09/2015  . Rosacea 11/01/2013  . Neuropathy of right ulnar nerve at wrist 06/05/2013  . HLD (hyperlipidemia) 04/16/2013  . Plantar fasciitis, bilateral 12/08/2012  . Anxiety 03/10/2012  . Major depressive disorder, recurrent episode, moderate ([place]) 07/14/2011  . Cervical radiculopathy 01/23/2011  . CARDIAC MURMUR 07/01/2010  . NONSPECIFIC MESENTERIC LYMPHADENITIS 01/29/2010  . NIGHT SWEATS 01/28/2010  . BACK PAIN 01/12/2010  . HEADACHE 09/16/2009  . [place] DISEASE, LUMBAR 06/24/2009  . SINUSITIS, CHRONIC 06/04/2009  . ALKALINE PHOSPHATASE, ELEVATED 03/19/2009  . HIATAL HERNIA 12/26/2008  . Other and unspecified ovarian cyst 12/17/2008  . Attention deficit disorder 10/31/2007    Jadian Karman Nilda Simmer PT, MPH  10/13/2016, 12:40 PM  [facility] [address]  [place], [place], [address] Phone: [phone]   Fax:  [phone]  Name: SAHALIE BETH MRN: [id number] Date of Birth: [birth year]

## 2016-10-18 ENCOUNTER — Encounter: Payer: Self-pay | Admitting: Rehabilitative and Restorative Service Providers"

## 2016-10-18 ENCOUNTER — Ambulatory Visit (INDEPENDENT_AMBULATORY_CARE_PROVIDER_SITE_OTHER): Payer: 59 | Admitting: Rehabilitative and Restorative Service Providers"

## 2016-10-18 DIAGNOSIS — R293 Abnormal posture: Secondary | ICD-10-CM | POA: Diagnosis not present

## 2016-10-18 DIAGNOSIS — M25512 Pain in left shoulder: Secondary | ICD-10-CM

## 2016-10-18 DIAGNOSIS — R531 Weakness: Secondary | ICD-10-CM | POA: Diagnosis not present

## 2016-10-18 NOTE — Therapy (Signed)
[place], [place], [address] Phone: [phone]   Fax:  [phone]  Physical Therapy Treatment  Patient Details  Name: Kristina Martinez MRN: [id number] Date of Birth: 1971-04-05 Referring Provider: Dr Dianah Field   Encounter Date: 10/18/2016      PT End of Session - 10/18/16 0934    Visit Number 2   Number of Visits 12   Date for PT Re-Evaluation 11/24/16   PT Start Time 0931   PT Stop Time 1025   PT Time Calculation (min) 54 min   Activity Tolerance Patient tolerated treatment well      Past Medical History:  Diagnosis Date  . Achilles tendon injury   . ADD (attention deficit disorder)   . Depression   . IBS (irritable bowel syndrome)   . Migraine   . Obese   . Ovarian cyst   . PMDD (premenstrual dysphoric disorder)   . Vaso-vagal reaction     Past Surgical History:  Procedure Laterality Date  . LAPAROSCOPIC CHOLECYSTECTOMY  2011    There were no vitals filed for this visit.      Subjective Assessment - 10/18/16 0934    Subjective Patient reports that the Lt shoudler is about the same. 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 That is bothering her more than the shoudler today. She has not had a chance to do any of her exercises since she was here for PT.    Currently in Pain? Yes   Pain Score 2    Pain Location Shoulder   Pain Orientation Left   Pain Descriptors / Indicators Aching;Nagging   Pain Type Acute pain   Pain Radiating Towards into the Lt side of the neck    Pain Onset More than a month ago   Pain Frequency Intermittent            [facility] PT Assessment - 10/18/16 0001      Assessment   Medical Diagnosis Lt shoulder dysfunction    Referring Provider Dr Dianah Field    Onset Date/Surgical Date 05/28/16   Hand Dominance Right   Next MD Visit 5/18     AROM   Left Shoulder Extension 44 Degrees   Left Shoulder  Flexion 135 Degrees   Left Shoulder ABduction 67 Degrees     Palpation   Palpation comment persistenttightness noted through the ant/lat/post cervical spine; upper traps; leveator; pecs; teres; lats                      [facility] Adult PT Treatment/Exercise - 10/18/16 0001      Shoulder Exercises: Seated   Other Seated Exercises lateral cervical flexioin 10 sec hold x 3 Rt/Lt      Shoulder Exercises: Standing   Other Standing Exercises scap squeeze 10 sec x 10 w/ noodle    Other Standing Exercises axial extension 10 sec x 5     Shoulder Exercises: Pulleys   Flexion --  10 sec hold x 5   ABduction Limitations --  10 sec hold x 5 scaption      Shoulder Exercises: Therapy Ball   Other Therapy Ball Exercises sitting ball at side - scapular depressioin closed kinetic chain 5 sec x 10      Shoulder Exercises: Stretch   Corner Stretch --  doorway lower two positions 30 sec x 2 each    Other Shoulder Stretches shoulder  flexion w/ball rolling forward; table slide; counter step back 3-5 reps each      Moist Heat Therapy   Number Minutes Moist Heat 20 Minutes   Moist Heat Location Shoulder;Cervical;Lumbar Spine  Lt      Electrical Stimulation   Electrical Stimulation Location Lt shoudler girdle    Electrical Stimulation Action IFC   Electrical Stimulation Parameters to tolerance   Electrical Stimulation Goals Pain;Tone     Manual Therapy   Manual therapy comments pt supine    Joint Mobilization GH distraction; circumduction   Soft tissue mobilization Lt shoulder girdle - pecs; upper trap/leveator; teres; medial scapular border; biceps/anterior deltoid    Myofascial Release Lt pecs; anterior shoulder    Scapular Mobilization Lt    Passive ROM Lt shoudler flexion; scaption; ER; IR                 PT Education - 10/18/16 0950    Education provided Yes   Education Details HEP    Person(s) Educated Patient   Methods Explanation;Demonstration;Tactile cues;Verbal  cues;Handout   Comprehension Verbalized understanding;Returned demonstration;Verbal cues required;Tactile cues required             PT Long Term Goals - 10/18/16 0933      PT LONG TERM GOAL #1   Title Improve posture and alignment allowing patient to demonstrate upright posture and neutral position of shoudler girdle - improved scapular control 11/24/16    Time 6   Period Weeks   Status On-going     PT LONG TERM GOAL #2   Title Increase AROM Lt shoudler to equal or greater than AROM Rt shoudler - without Lt shoudler pain with ROM 11/24/16   Time 6   Period Weeks   Status On-going     PT LONG TERM GOAL #3   Title Patient reports return to normal functional activities at home and work 11/24/16   Time 6   Period Weeks   Status On-going     PT LONG TERM GOAL #4   Title Independent in HEP 11/24/16   Time 6   Period Weeks   Status On-going     PT LONG TERM GOAL #5   Title Improve FOTO to </= 31% limitation 11/24/16   Time 6   Period Weeks   Status On-going               Plan - 10/18/16 1018    Clinical Impression Statement Exie has not had a chance to work on HEP due to work demands. She will begin with HEP this week. She added exercise without discomfot and tolerated manual work and PROM well. No goals meet - only second visit.    Rehab Potential Good   PT Frequency 2x / week   PT Duration 6 weeks   PT Treatment/Interventions Patient/family education;Neuromuscular re-education;ADLs/Self Care Home Management;Cryotherapy;Electrical Stimulation;Iontophoresis 4mg /ml Dexamethasone;Moist Heat;Ultrasound;Dry needling;Manual techniques;Therapeutic activities;Therapeutic exercise   PT Next Visit Plan progress with posterior shoudler girdle activition; add higher level pec stretch as tolerated; manual work Lt shoudler girdle; ROM; trial of DN as indicated    Consulted and Agree with Plan of Care Patient      Patient will benefit from skilled therapeutic intervention in  order to improve the following deficits and impairments:  Postural dysfunction, Improper body mechanics, Pain, Decreased range of motion, Impaired UE functional use, Increased fascial restricitons, Increased muscle spasms, Decreased mobility, Decreased strength, Decreased activity tolerance  Visit Diagnosis: Acute pain of left shoulder  Abnormal posture  Weakness generalized     Problem List Patient Active Problem List   Diagnosis Date Noted  . Dysfunction of left rotator cuff 09/29/2016  . Pulmonary nodule, right 06/02/2016  . Needs flu shot 02/26/2016  . Primary osteoarthritis of left knee 01/09/2015  . Rosacea 11/01/2013  . Neuropathy of right ulnar nerve at wrist 06/05/2013  . HLD (hyperlipidemia) 04/16/2013  . Plantar fasciitis, bilateral 12/08/2012  . Anxiety 03/10/2012  . Major depressive disorder, recurrent episode, moderate ([place]) 07/14/2011  . Cervical radiculopathy 01/23/2011  . CARDIAC MURMUR 07/01/2010  . NONSPECIFIC MESENTERIC LYMPHADENITIS 01/29/2010  . NIGHT SWEATS 01/28/2010  . BACK PAIN 01/12/2010  . HEADACHE 09/16/2009  . [place] DISEASE, LUMBAR 06/24/2009  . SINUSITIS, CHRONIC 06/04/2009  . ALKALINE PHOSPHATASE, ELEVATED 03/19/2009  . HIATAL HERNIA 12/26/2008  . Other and unspecified ovarian cyst 12/17/2008  . Attention deficit disorder 10/31/2007    Nonnie Pickney Nilda Simmer PT, MPH  10/18/2016, 10:19 AM  [facility] [place], [place], [address] Phone: [phone]   Fax:  [phone]  Name: Kristina Martinez MRN: [id number] Date of Birth: 1970-11-18

## 2016-10-18 NOTE — Patient Instructions (Addendum)
Scapula Adduction With Pectoralis Stretch: Low - Standing   Shoulders at 45 hands even with shoulders, keeping weight through legs, shift weight forward until you feel pull or stretch through the front of your chest. Hold _30__ seconds. Do _3__ times, _2-4__ times per day.   Scapula Adduction With Pectoralis Stretch: Mid-Range - Standing   Shoulders at 90 elbows even with shoulders, keeping weight through legs, shift weight forward until you feel pull or strength through the front of your chest. Hold __30_ seconds. Do _3__ times, __2-4_ times per day.   Scapula Adduction With Pectoralis Stretch: High - Standing   Shoulders at 120 hands up high on the doorway, keeping weight on feet, shift weight forward until you feel pull or stretch through the front of your chest. Hold _30__ seconds. Do _3__ times, _2-3__ times per day.  SUPINE Tips A    Being in the supine position means to be lying on the back. Lying on the back is the position of least compression on the bones and discs of the spine, and helps to re-align the natural curves of the back. Arms at side in comfortable position. Gradually slide arms up toward ears. Arms supported on table or bed or floor. Hold 5 min can bend elbow to release the stretch if needed

## 2016-10-20 ENCOUNTER — Encounter: Payer: Self-pay | Admitting: Rehabilitative and Restorative Service Providers"

## 2016-10-20 ENCOUNTER — Ambulatory Visit (INDEPENDENT_AMBULATORY_CARE_PROVIDER_SITE_OTHER): Payer: 59 | Admitting: Rehabilitative and Restorative Service Providers"

## 2016-10-20 DIAGNOSIS — R531 Weakness: Secondary | ICD-10-CM

## 2016-10-20 DIAGNOSIS — R293 Abnormal posture: Secondary | ICD-10-CM

## 2016-10-20 DIAGNOSIS — M25512 Pain in left shoulder: Secondary | ICD-10-CM | POA: Diagnosis not present

## 2016-10-20 NOTE — Therapy (Signed)
[place], [place], [address] Phone: [phone]   Fax:  [phone]  Physical Therapy Treatment  Patient Details  Name: Kristina Martinez MRN: [id number] Date of Birth: [birth year] Referring Provider: Dr Dianah Field   Encounter Date: 10/20/2016      PT End of Session - 10/20/16 0758    Visit Number 3   Number of Visits 12   Date for PT Re-Evaluation 11/24/16   PT Start Time 0757   PT Stop Time 0852   PT Time Calculation (min) 55 min   Activity Tolerance Patient tolerated treatment well      Past Medical History:  Diagnosis Date  . Achilles tendon injury   . ADD (attention deficit disorder)   . Depression   . IBS (irritable bowel syndrome)   . Migraine   . Obese   . Ovarian cyst   . PMDD (premenstrual dysphoric disorder)   . Vaso-vagal reaction     Past Surgical History:  Procedure Laterality Date  . LAPAROSCOPIC CHOLECYSTECTOMY  2011    There were no vitals filed for this visit.      Subjective Assessment - 10/20/16 0759    Subjective Some soreness in the Lt shoulder - but "a good sore". Notices she is having less pain and more motion. No time for exercise due to work.    Currently in Pain? Yes   Pain Score 1   pain gets worse thorough the day   Pain Location Shoulder   Pain Orientation Left   Pain Descriptors / Indicators Aching;Nagging   Pain Type Acute pain                         [facility] Adult PT Treatment/Exercise - 10/20/16 0001      Shoulder Exercises: Standing   Extension Strengthening;Both;10 reps;Theraband   Theraband Level (Shoulder Extension) Level 2 (Red)   Row Strengthening;Both;10 reps;Theraband   Theraband Level (Shoulder Row) Level 2 (Red)   Retraction Strengthening;Both;10 reps;Theraband   Theraband Level (Shoulder Retraction) Level 1 (Yellow)   Other Standing Exercises scap squeeze 10 sec x 10 w/ noodle    Other Standing Exercises axial  extension 10 sec x 5     Shoulder Exercises: Pulleys   Flexion --  10 sec hold x 5   ABduction Limitations --  10 sec hold x 5 scaption      Shoulder Exercises: Stretch   Corner Stretch --  doorway three positions 30 sec x 2 each    Other Shoulder Stretches shoulder flexion w/ball rolling forward; table slide; counter step back 3-5 reps each      Moist Heat Therapy   Number Minutes Moist Heat 20 Minutes   Moist Heat Location Shoulder;Cervical  Lt      Electrical Stimulation   Electrical Stimulation Location Lt shoudler girdle    Electrical Stimulation Action IFC   Electrical Stimulation Parameters to tolerance   Electrical Stimulation Goals Pain;Tone     Manual Therapy   Manual therapy comments pt supine    Joint Mobilization GH distraction; circumduction   Soft tissue mobilization Lt shoulder girdle - pecs; upper trap/leveator; teres; medial scapular border; biceps/anterior deltoid    Myofascial Release Lt pecs; anterior shoulder    Scapular Mobilization Lt    Passive ROM Lt shoudler flexion; scaption; ER; IR           Trigger Point Dry Needling - 10/20/16 0846    Consent  Given? Yes   Education Handout Provided Yes   Muscles Treated Upper Body Pectoralis major;Pectoralis minor  biceps; anterior and middle deltoid Lt decreased tightness    Pectoralis Major Response Palpable increased muscle length   Pectoralis Minor Response Palpable increased muscle length              PT Education - 10/20/16 0809    Education provided Yes   Education Details HEP   Person(s) Educated Patient   Methods Explanation;Demonstration;Tactile cues;Verbal cues;Handout   Comprehension Verbalized understanding;Returned demonstration;Verbal cues required;Tactile cues required             PT Long Term Goals - 10/18/16 0933      PT LONG TERM GOAL #1   Title Improve posture and alignment allowing patient to demonstrate upright posture and neutral position of shoudler girdle -  improved scapular control 11/24/16    Time 6   Period Weeks   Status On-going     PT LONG TERM GOAL #2   Title Increase AROM Lt shoudler to equal or greater than AROM Rt shoudler - without Lt shoudler pain with ROM 11/24/16   Time 6   Period Weeks   Status On-going     PT LONG TERM GOAL #3   Title Patient reports return to normal functional activities at home and work 11/24/16   Time 6   Period Weeks   Status On-going     PT LONG TERM GOAL #4   Title Independent in HEP 11/24/16   Time 6   Period Weeks   Status On-going     PT LONG TERM GOAL #5   Title Improve FOTO to </= 31% limitation 11/24/16   Time 6   Period Weeks   Status On-going               Plan - 10/20/16 0801    Clinical Impression Statement Gradually progressing with less pain and improved mobility. She has not been able to work on exercises at home due to work demands. Would likely improve quicker with consistent HEP. Note less palpable tightness through the shoudler. Improving posture and alignment with exercises. Progressing toward stated goals of therapy.    Rehab Potential Good   PT Frequency 2x / week   PT Duration 6 weeks   PT Treatment/Interventions Patient/family education;Neuromuscular re-education;ADLs/Self Care Home Management;Cryotherapy;Electrical Stimulation;Iontophoresis 4mg /ml Dexamethasone;Moist Heat;Ultrasound;Dry needling;Manual techniques;Therapeutic activities;Therapeutic exercise   PT Next Visit Plan progress with posterior shoudler girdle activition; add higher level pec stretch as tolerated; manual work Lt shoudler girdle; ROM; assess response to DN    Consulted and Agree with Plan of Care Patient      Patient will benefit from skilled therapeutic intervention in order to improve the following deficits and impairments:  Postural dysfunction, Improper body mechanics, Pain, Decreased range of motion, Impaired UE functional use, Increased fascial restricitons, Increased muscle spasms,  Decreased mobility, Decreased strength, Decreased activity tolerance  Visit Diagnosis: Acute pain of left shoulder  Abnormal posture  Weakness generalized     Problem List Patient Active Problem List   Diagnosis Date Noted  . Dysfunction of left rotator cuff 09/29/2016  . Pulmonary nodule, right 06/02/2016  . Needs flu shot 02/26/2016  . Primary osteoarthritis of left knee 01/09/2015  . Rosacea 11/01/2013  . Neuropathy of right ulnar nerve at wrist 06/05/2013  . HLD (hyperlipidemia) 04/16/2013  . Plantar fasciitis, bilateral 12/08/2012  . Anxiety 03/10/2012  . Major depressive disorder, recurrent episode, moderate ([place]) 07/14/2011  . Cervical  radiculopathy 01/23/2011  . CARDIAC MURMUR 07/01/2010  . NONSPECIFIC MESENTERIC LYMPHADENITIS 01/29/2010  . NIGHT SWEATS 01/28/2010  . BACK PAIN 01/12/2010  . HEADACHE 09/16/2009  . [place] DISEASE, LUMBAR 06/24/2009  . SINUSITIS, CHRONIC 06/04/2009  . ALKALINE PHOSPHATASE, ELEVATED 03/19/2009  . HIATAL HERNIA 12/26/2008  . Other and unspecified ovarian cyst 12/17/2008  . Attention deficit disorder 10/31/2007    Celyn Nilda Simmer PT, MPH  10/20/2016, 8:48 AM  [facility] [place], [place], [address] Phone: [phone]   Fax:  [phone]  Name: QUINTELLA MURA MRN: [id number] Date of Birth: 05/29/1971

## 2016-10-20 NOTE — Patient Instructions (Signed)
Resisted External Rotation: in Neutral - Bilateral   PALMS UP Sit or stand, tubing in both hands, elbows at sides, bent to 90, forearms forward. Pinch shoulder blades together and rotate forearms out. Keep elbows at sides. Repeat __10__ times per set. Do _2-3___ sets per session. Do _2-3___ sessions per day.   Low Row: Standing   Face anchor, feet shoulder width apart. Palms up, pull arms back, squeezing shoulder blades together. Repeat 10__ times per set. Do 2-3__ sets per session. Do 2-3__ sessions per day Anchor Height: Waist   Strengthening: Resisted Extension   Hold tubing in right hand, arm forward. Pull arm back, elbow straight. Repeat _10___ times per set. Do 2-3____ sets per session. Do 2-3____ sessions per day.    Trigger Point Dry Needling  . What is Trigger Point Dry Needling (DN)? o DN is a physical therapy technique used to treat muscle pain and dysfunction. Specifically, DN helps deactivate muscle trigger points (muscle knots).  o A thin filiform needle is used to penetrate the skin and stimulate the underlying trigger point. The goal is for a local twitch response (LTR) to occur and for the trigger point to relax. No medication of any kind is injected during the procedure.   . What Does Trigger Point Dry Needling Feel Like?  o The procedure feels different for each individual patient. Some patients report that they do not actually feel the needle enter the skin and overall the process is not painful. Very mild bleeding may occur. However, many patients feel a deep cramping in the muscle in which the needle was inserted. This is the local twitch response.   Marland Kitchen How Will I feel after the treatment? o Soreness is normal, and the onset of soreness may not occur for a few hours. Typically this soreness does not last longer than two days.  o Bruising is uncommon, however; ice can be used to decrease any possible bruising.  o In rare cases feeling tired or nauseous after  the treatment is normal. In addition, your symptoms may get worse before they get better, this period will typically not last longer than 24 hours.   . What Can I do After My Treatment? o Increase your hydration by drinking more water for the next 24 hours. o You may place ice or heat on the areas treated that have become sore, however, do not use heat on inflamed or bruised areas. Heat often brings more relief post needling. o You can continue your regular activities, but vigorous activity is not recommended initially after the treatment for 24 hours. o DN is best combined with other physical therapy such as strengthening, stretching, and other therapies.

## 2016-10-25 ENCOUNTER — Ambulatory Visit (INDEPENDENT_AMBULATORY_CARE_PROVIDER_SITE_OTHER): Payer: 59 | Admitting: Rehabilitative and Restorative Service Providers"

## 2016-10-25 ENCOUNTER — Encounter: Payer: Self-pay | Admitting: Rehabilitative and Restorative Service Providers"

## 2016-10-25 DIAGNOSIS — R293 Abnormal posture: Secondary | ICD-10-CM | POA: Diagnosis not present

## 2016-10-25 DIAGNOSIS — R531 Weakness: Secondary | ICD-10-CM

## 2016-10-25 DIAGNOSIS — M25512 Pain in left shoulder: Secondary | ICD-10-CM

## 2016-10-25 NOTE — Patient Instructions (Signed)

## 2016-10-25 NOTE — Therapy (Signed)
[place], [place], [address] Phone: [phone]   Fax:  [phone]  Physical Therapy Treatment  Patient Details  Name: Kristina Martinez MRN: [id number] Date of Birth: [birth year] Referring Provider: Dr Dianah Field   Encounter Date: 10/25/2016      PT End of Session - 10/25/16 0849    Visit Number 4   Number of Visits 12   Date for PT Re-Evaluation 11/24/16   PT Start Time 0845   PT Stop Time 0942   PT Time Calculation (min) 57 min   Activity Tolerance Patient tolerated treatment well      Past Medical History:  Diagnosis Date  . Achilles tendon injury   . ADD (attention deficit disorder)   . Depression   . IBS (irritable bowel syndrome)   . Migraine   . Obese   . Ovarian cyst   . PMDD (premenstrual dysphoric disorder)   . Vaso-vagal reaction     Past Surgical History:  Procedure Laterality Date  . LAPAROSCOPIC CHOLECYSTECTOMY  2011    There were no vitals filed for this visit.      Subjective Assessment - 10/25/16 0854    Subjective Increased pain today. Worked long hours over the past two days and slept propped up in the bed last night due to reflux. Did get a pool noodle. Has a TENS unit but has not used it at home.    Currently in Pain? Yes   Pain Score 4    Pain Location Shoulder   Pain Orientation Left   Pain Descriptors / Indicators Aching;Nagging   Pain Type Acute pain   Pain Onset More than a month ago   Pain Frequency Intermittent                         [facility] Adult PT Treatment/Exercise - 10/25/16 0001      Shoulder Exercises: Standing   Extension Strengthening;Both;10 reps;Theraband   Theraband Level (Shoulder Extension) Level 2 (Red)   Row Strengthening;Both;10 reps;Theraband   Theraband Level (Shoulder Row) Level 2 (Red)   Retraction Strengthening;Both;10 reps;Theraband   Theraband Level (Shoulder Retraction) Level 1 (Yellow)   Other  Standing Exercises scap squeeze 10 sec x 10 w/ noodle    Other Standing Exercises axial extension 10 sec x 5     Shoulder Exercises: Pulleys   Flexion --  10 sec hold x 5   ABduction Limitations --  10 sec hold x 5 scaption      Shoulder Exercises: Therapy Ball   Other Therapy Ball Exercises sitting ball at side - scapular depressioin closed kinetic chain 5 sec x 10      Shoulder Exercises: IT sales professional --  doorway three positions 30 sec x 2 each      Moist Heat Therapy   Number Minutes Moist Heat 20 Minutes   Moist Heat Location Shoulder;Cervical  Lt      Electrical Stimulation   Electrical Stimulation Location Lt shoudler girdle    Electrical Stimulation Action IFC   Electrical Stimulation Parameters to tolerance   Electrical Stimulation Goals Pain;Tone     Iontophoresis   Type of Iontophoresis Dexamethasone   Location anterior Lt shoulder    Dose 1105mAmp   Time 8-10 hr      Manual Therapy   Manual therapy comments pt supine    Joint Mobilization GH distraction; circumduction   Soft tissue mobilization Lt shoulder girdle -  pecs; upper trap/leveator; teres; medial scapular border; biceps/anterior deltoid    Myofascial Release Lt pecs; anterior shoulder    Scapular Mobilization Lt    Passive ROM Lt shoudler flexion; scaption; ER; IR                 PT Education - 10/25/16 0933    Education provided Yes   Education Details encouraged consistent exercise; ionto info   Person(s) Educated Patient   Methods Explanation   Comprehension Verbalized understanding             PT Long Term Goals - 10/25/16 0849      PT LONG TERM GOAL #1   Title Improve posture and alignment allowing patient to demonstrate upright posture and neutral position of shoudler girdle - improved scapular control 11/24/16    Time 6   Period Weeks   Status On-going     PT LONG TERM GOAL #2   Title Increase AROM Lt shoudler to equal or greater than AROM Rt shoudler -  without Lt shoudler pain with ROM 11/24/16   Time 6   Period Weeks   Status On-going     PT LONG TERM GOAL #3   Title Patient reports return to normal functional activities at home and work 11/24/16   Time 6   Period Weeks   Status On-going     PT LONG TERM GOAL #4   Title Independent in HEP 11/24/16   Time 6   Period Weeks   Status On-going     PT LONG TERM GOAL #5   Title Improve FOTO to </= 31% limitation 11/24/16   Time 6   Period Weeks   Status On-going               Plan - 10/25/16 0850    Clinical Impression Statement Improving mobilty but continued pain. Patient continues to have difficulty with consistent HEP - especially when working. Does use arms in elevation throughtout her work day and works long hours in the [employer]. Responded well to DN but would like to wait until next visit to repeat. No new exercises today. Patient has not practiced the exercises at home and needs additioinal instruction. Gradually progressing toward stated goals of therapy.    Rehab Potential Good   PT Frequency 2x / week   PT Duration 6 weeks   PT Treatment/Interventions Patient/family education;Neuromuscular re-education;ADLs/Self Care Home Management;Cryotherapy;Electrical Stimulation;Iontophoresis 4mg /ml Dexamethasone;Moist Heat;Ultrasound;Dry needling;Manual techniques;Therapeutic activities;Therapeutic exercise   PT Next Visit Plan progress with posterior shoudler girdle activition; add higher level pec stretch as tolerated; manual work Lt shoudler girdle; ROM; repeat DN as indicated    Consulted and Agree with Plan of Care Patient      Patient will benefit from skilled therapeutic intervention in order to improve the following deficits and impairments:  Postural dysfunction, Improper body mechanics, Pain, Decreased range of motion, Impaired UE functional use, Increased fascial restricitons, Increased muscle spasms, Decreased mobility, Decreased strength, Decreased activity  tolerance  Visit Diagnosis: Acute pain of left shoulder  Abnormal posture  Weakness generalized     Problem List Patient Active Problem List   Diagnosis Date Noted  . Dysfunction of left rotator cuff 09/29/2016  . Pulmonary nodule, right 06/02/2016  . Needs flu shot 02/26/2016  . Primary osteoarthritis of left knee 01/09/2015  . Rosacea 11/01/2013  . Neuropathy of right ulnar nerve at wrist 06/05/2013  . HLD (hyperlipidemia) 04/16/2013  . Plantar fasciitis, bilateral 12/08/2012  . Anxiety 03/10/2012  .  Major depressive disorder, recurrent episode, moderate ([place]) 07/14/2011  . Cervical radiculopathy 01/23/2011  . CARDIAC MURMUR 07/01/2010  . NONSPECIFIC MESENTERIC LYMPHADENITIS 01/29/2010  . NIGHT SWEATS 01/28/2010  . BACK PAIN 01/12/2010  . HEADACHE 09/16/2009  . [place] DISEASE, LUMBAR 06/24/2009  . SINUSITIS, CHRONIC 06/04/2009  . ALKALINE PHOSPHATASE, ELEVATED 03/19/2009  . HIATAL HERNIA 12/26/2008  . Other and unspecified ovarian cyst 12/17/2008  . Attention deficit disorder 10/31/2007    Maximillian Habibi Nilda Simmer PT, MPH  10/25/2016, 9:33 AM  [facility] [place], [place], [address] Phone: [phone]   Fax:  [phone]  Name: ANTONYA LEEDER MRN: [id number] Date of Birth: 1971-06-02

## 2016-10-27 ENCOUNTER — Ambulatory Visit (INDEPENDENT_AMBULATORY_CARE_PROVIDER_SITE_OTHER): Payer: 59 | Admitting: Rehabilitative and Restorative Service Providers"

## 2016-10-27 ENCOUNTER — Encounter: Payer: Self-pay | Admitting: Rehabilitative and Restorative Service Providers"

## 2016-10-27 DIAGNOSIS — M25512 Pain in left shoulder: Secondary | ICD-10-CM | POA: Diagnosis not present

## 2016-10-27 DIAGNOSIS — R293 Abnormal posture: Secondary | ICD-10-CM | POA: Diagnosis not present

## 2016-10-27 DIAGNOSIS — R531 Weakness: Secondary | ICD-10-CM

## 2016-10-27 NOTE — Therapy (Addendum)
[place], [place], [address] Phone: [phone]   Fax:  [phone]  Physical Therapy Treatment  Patient Details  Name: Kristina Martinez MRN: [id number] Date of Birth: 10/07/1970 Referring Provider: Dr Dianah Field   Encounter Date: 10/27/2016      PT End of Session - 10/27/16 1558    Visit Number 5   Number of Visits 12   Date for PT Re-Evaluation 11/24/16   PT Start Time 8453   PT Stop Time 1651   PT Time Calculation (min) 55 min   Activity Tolerance Patient tolerated treatment well      Past Medical History:  Diagnosis Date  . Achilles tendon injury   . ADD (attention deficit disorder)   . Depression   . IBS (irritable bowel syndrome)   . Migraine   . Obese   . Ovarian cyst   . PMDD (premenstrual dysphoric disorder)   . Vaso-vagal reaction     Past Surgical History:  Procedure Laterality Date  . LAPAROSCOPIC CHOLECYSTECTOMY  2011    There were no vitals filed for this visit.      Subjective Assessment - 10/27/16 1559    Subjective Shoulder continues to be painful and is now feeling some discomfort and tightness up into the Lt side of her neck.    Pain Score 5    Pain Location Neck   Pain Orientation Left   Pain Descriptors / Indicators Aching;Dull   Pain Type Acute pain   Pain Onset More than a month ago   Pain Frequency Intermittent   Aggravating Factors  working on computer and phone; moving arm   Pain Relieving Factors heat; avoiding activities that irritate the symptoms                          [facility] Adult PT Treatment/Exercise - 10/27/16 0001      Shoulder Exercises: Standing   Other Standing Exercises scap squeeze 10 sec x 10 w/ noodle    Other Standing Exercises axial extension 10 sec x 5     Shoulder Exercises: Pulleys   Flexion --  10 sec hold x 5   ABduction Limitations --  10 sec hold x 5 scaption      Shoulder Exercises: Stretch   Corner Stretch --  doorway three positions 30 sec x 2 each      Moist Heat Therapy   Number Minutes Moist Heat 15 Minutes   Moist Heat Location Shoulder;Cervical  Lt      Electrical Stimulation   Electrical Stimulation Location Lt shoudler girdle; posterior cervical    Electrical Stimulation Action IFC   Electrical Stimulation Parameters to tolerance   Electrical Stimulation Goals Pain;Tone     Iontophoresis   Type of Iontophoresis Dexamethasone   Location anterior Lt shoulder x 1; Lt leveator x 1   Dose 148mmp   Time 8-10 hr      Manual Therapy   Manual therapy comments pt supine and sitting    Joint Mobilization upper thoracic/lower cervical vertebrae CPA and lateral mobs Grade II/III   Soft tissue mobilization Lt posterior lateral cervical musculature; Lt shoulder girdle - pecs; upper trap/leveator; teres; medial scapular border; biceps/anterior deltoid    Myofascial Release Lt posterior lateral cervical    Scapular Mobilization Lt                      PT Long Term Goals -  10/25/16 0849      PT LONG TERM GOAL #1   Title Improve posture and alignment allowing patient to demonstrate upright posture and neutral position of shoudler girdle - improved scapular control 11/24/16    Time 6   Period Weeks   Status On-going     PT LONG TERM GOAL #2   Title Increase AROM Lt shoudler to equal or greater than AROM Rt shoudler - without Lt shoudler pain with ROM 11/24/16   Time 6   Period Weeks   Status On-going     PT LONG TERM GOAL #3   Title Patient reports return to normal functional activities at home and work 11/24/16   Time 6   Period Weeks   Status On-going     PT LONG TERM GOAL #4   Title Independent in HEP 11/24/16   Time 6   Period Weeks   Status On-going     PT LONG TERM GOAL #5   Title Improve FOTO to </= 31% limitation 11/24/16   Time 6   Period Weeks   Status On-going               Plan - 10/27/16 1604    Clinical Impression  Statement continued flare up of symptoms reported today. Anterior shoudler is some better but now pain and tightness is increased into the Lt neck and trap area. Lt UE radicular symtpoms reproduced with work through the [employer] area as well as Lt lateral cervical musculature and spine area C5/6/7.  Patient spent several hours sitting at desk/computer - laptop and working on the phone today and yesterday. Patient tolerates only a few DN needles. Muscular tightness improved with DN; modalities; manual work.    Rehab Potential Good   PT Frequency 2x / week   PT Duration 6 weeks   PT Treatment/Interventions Patient/family education;Neuromuscular re-education;ADLs/Self Care Home Management;Cryotherapy;Electrical Stimulation;Iontophoresis 16m/ml Dexamethasone;Moist Heat;Ultrasound;Dry needling;Manual techniques;Therapeutic activities;Therapeutic exercise   PT Next Visit Plan progress with posterior shoudler girdle activition; add higher level pec stretch as tolerated; manual work Lt shoudler girdle; ROM; repeat DN as indicated. Encouraged patient to moniter posture and positions for ADL's/work activities.    Consulted and Agree with Plan of Care Patient      Patient will benefit from skilled therapeutic intervention in order to improve the following deficits and impairments:  Postural dysfunction, Improper body mechanics, Pain, Decreased range of motion, Impaired UE functional use, Increased fascial restricitons, Increased muscle spasms, Decreased mobility, Decreased strength, Decreased activity tolerance  Visit Diagnosis: Acute pain of left shoulder  Abnormal posture  Weakness generalized     Problem List Patient Active Problem List   Diagnosis Date Noted  . Dysfunction of left rotator cuff 09/29/2016  . Pulmonary nodule, right 06/02/2016  . Needs flu shot 02/26/2016  . Primary osteoarthritis of left knee 01/09/2015  . Rosacea 11/01/2013  . Neuropathy of right ulnar nerve at wrist  06/05/2013  . HLD (hyperlipidemia) 04/16/2013  . Plantar fasciitis, bilateral 12/08/2012  . Anxiety 03/10/2012  . Major depressive disorder, recurrent episode, moderate ([place] 07/14/2011  . Cervical radiculopathy 01/23/2011  . CARDIAC MURMUR 07/01/2010  . NONSPECIFIC MESENTERIC LYMPHADENITIS 01/29/2010  . NIGHT SWEATS 01/28/2010  . BACK PAIN 01/12/2010  . HEADACHE 09/16/2009  . [place] 06/24/2009  . SINUSITIS, CHRONIC 06/04/2009  . ALKALINE PHOSPHATASE, ELEVATED 03/19/2009  . HIATAL HERNIA 12/26/2008  . Other and unspecified ovarian cyst 12/17/2008  . Attention deficit disorder 10/31/2007    Jinx Gilden P HHelene Kelp  PT, MPH  10/27/2016, [place], [place], [address] Phone: [phone]   Fax:  [phone]  Name: KINLIE JANICE MRN: [id number] Date of Birth: March 11, 1971  PHYSICAL THERAPY DISCHARGE SUMMARY  Visits from Start of Care: 5  Current functional level related to goals / functional outcomes: See progress note for discharge status.    Remaining deficits: Continued symptoms   Education / Equipment: HEP Plan: Patient agrees to discharge.  Patient goals were not met. Patient is being discharged due to not returning since the last visit.  ?????     Martesha Niedermeier P. Helene Kelp PT, MPH 12/02/16 3:23 PM

## 2016-10-28 ENCOUNTER — Other Ambulatory Visit: Payer: Self-pay | Admitting: Family Medicine

## 2017-01-03 ENCOUNTER — Other Ambulatory Visit: Payer: Self-pay | Admitting: Family Medicine

## 2017-02-10 ENCOUNTER — Other Ambulatory Visit: Payer: Self-pay | Admitting: Family Medicine

## 2017-02-15 ENCOUNTER — Other Ambulatory Visit: Payer: Self-pay | Admitting: *Deleted

## 2017-02-15 ENCOUNTER — Telehealth: Payer: Self-pay

## 2017-02-15 DIAGNOSIS — F988 Other specified behavioral and emotional disorders with onset usually occurring in childhood and adolescence: Secondary | ICD-10-CM

## 2017-02-15 MED ORDER — METHYLPHENIDATE HCL 20 MG PO TABS: 20.0000 mg | ORAL_TABLET | Freq: Every day | ORAL | 0 refills | Status: DC | PRN

## 2017-02-15 NOTE — Telephone Encounter (Signed)
Pt is requesting a refill on her ritalin.  Please advise.

## 2017-02-15 NOTE — Telephone Encounter (Signed)
Filled today. shw will schedul an appt

## 2017-02-15 NOTE — Progress Notes (Unsigned)
Pt walked in. Pt was advised she MUST make and KEEP appt for future refills. No exceptions.Kristina Martinez [name] Pt was advised and appt made.Kristina Martinez [place]

## 2017-02-22 ENCOUNTER — Ambulatory Visit (INDEPENDENT_AMBULATORY_CARE_PROVIDER_SITE_OTHER): Payer: 59 | Admitting: Family Medicine

## 2017-02-22 ENCOUNTER — Encounter: Payer: Self-pay | Admitting: Family Medicine

## 2017-02-22 VITALS — BP 136/81 | HR 103 | Wt 245.0 lb

## 2017-02-22 DIAGNOSIS — R197 Diarrhea, unspecified: Secondary | ICD-10-CM | POA: Diagnosis not present

## 2017-02-22 LAB — CBC WITH DIFFERENTIAL/PLATELET
BASOS PCT: 1 %
Basophils Absolute: 82 cells/uL (ref 0–200)
EOS PCT: 6 %
Eosinophils Absolute: 492 cells/uL (ref 15–500)
HCT: 33.6 % — ABNORMAL LOW (ref 35.0–45.0)
Hemoglobin: 10.2 g/dL — ABNORMAL LOW (ref 11.7–15.5)
Lymphocytes Relative: 26 %
Lymphs Abs: 2132 cells/uL (ref 850–3900)
MCH: 21.4 pg — AB (ref 27.0–33.0)
MCHC: 30.4 g/dL — ABNORMAL LOW (ref 32.0–36.0)
MCV: 70.6 fL — ABNORMAL LOW (ref 80.0–100.0)
MONOS PCT: 8 %
MPV: 10.5 fL (ref 7.5–12.5)
Monocytes Absolute: 656 cells/uL (ref 200–950)
NEUTROS ABS: 4838 {cells}/uL (ref 1500–7800)
Neutrophils Relative %: 59 %
PLATELETS: 409 10*3/uL — AB (ref 140–400)
RBC: 4.76 MIL/uL (ref 3.80–5.10)
RDW: 17.8 % — ABNORMAL HIGH (ref 11.0–15.0)
WBC: 8.2 10*3/uL (ref 3.8–10.8)

## 2017-02-22 NOTE — Progress Notes (Signed)
Subjective:    Patient ID: Kristina Martinez, female    DOB: October 18, 1970, 46 y.o.   MRN: [id number]  HPI 46 yo female comes in c/o of 10 days of diarrhea.  x 10- days she denies fevers,sweats,chills. she has been using OTC anti-diarrheal meds which have not really seemed to help. immodium and pepto bismuth she stated that her stools have been pale and liquid in consistencey and the last one was this AM.   For example today she is Artie had 10 bowel movements. She describes them as watery. E has not seen any blood. She does get some mild cramping but it's not severe. She did visit a group home about 2 weeks ago. No known sick contacts.  He doesn't think she ate any contaminated food.she reports that she has had "GI problems for years".    Review of Systems   BP 136/81   Pulse (!) 103   Wt 245 lb (111.1 kg)   SpO2 100%   BMI 38.37 kg/m     Allergies  Allergen Reactions  . Ciprofloxacin Hives  . Zofran [Ondansetron Hcl]     Migraine     Past Medical History:  Diagnosis Date  . Achilles tendon injury   . ADD (attention deficit disorder)   . Depression   . IBS (irritable bowel syndrome)   . Migraine   . Obese   . Ovarian cyst   . PMDD (premenstrual dysphoric disorder)   . Vaso-vagal reaction     Past Surgical History:  Procedure Laterality Date  . LAPAROSCOPIC CHOLECYSTECTOMY  2011    Social History   Social History  . Marital status: Married    Spouse name: N/A  . Number of children: N/A  . Years of education: N/A   Occupational History  . Not on file.   Social History Main Topics  . Smoking status: Never Smoker  . Smokeless tobacco: Never Used  . Alcohol use Yes     Comment: 1/ in 6 months  . Drug use: No  . Sexual activity: Not on file   Other Topics Concern  . Not on file   Social History Narrative  . No narrative on file    Family History  Problem Relation Age of Onset  . Skin cancer Mother        skin  . Prostate cancer Father   . Heart  disease Father        AMI  . Heart failure Father   . Stroke Brother 40  . Breast cancer Maternal Grandmother   . Uterine cancer Maternal Grandmother        ? cervix  . Breast cancer Paternal Grandmother     Outpatient Encounter Prescriptions as of 02/22/2017  Medication Sig  . ALPRAZolam (XANAX) 0.5 MG tablet TAKE 1/2 TO 1 TABLET BY MOUTH DAILY AS NEEDED  . citalopram (CELEXA) 40 MG tablet Take 1 tablet (40 mg total) by mouth daily. (Patient taking differently: Take 20 mg by mouth daily. )  . fluticasone (FLOVENT HFA) 110 MCG/ACT inhaler Inhale 2 puffs into the lungs 2 (two) times daily. For cough  . levonorgestrel-ethinyl estradiol (SEASONALE,INTROVALE,JOLESSA) 0.15-0.03 MG tablet TAKE 1 TABLET BY MOUTH  DAILY  . methylphenidate (RITALIN) 20 MG tablet Take 1 tablet (20 mg total) by mouth daily as needed (for additional concentration).  Marland Kitchen omeprazole (PRILOSEC) 40 MG capsule Take 40 mg by mouth daily.  . [DISCONTINUED] cetirizine (ZYRTEC) 10 MG tablet Take 10 mg by mouth daily  as needed for allergies.  . [DISCONTINUED] Iron-FA-B Cmp-C-Biot-Probiotic (FUSION PLUS) CAPS Take 1 capsule by mouth daily.  . [DISCONTINUED] meloxicam (MOBIC) 15 MG tablet One tab PO qAM with breakfast for 2 weeks, then daily prn pain.  . [DISCONTINUED] promethazine (PHENERGAN) 25 MG tablet Take 1 tablet (25 mg total) by mouth every 6 (six) hours as needed for nausea or vomiting.   No facility-administered encounter medications on file as of 02/22/2017.           Objective:   Physical Exam  Constitutional: She is oriented to person, place, and time. She appears well-developed and well-nourished.  HENT:  Head: Normocephalic and atraumatic.  Cardiovascular: Normal rate, regular rhythm and normal heart sounds.   Pulmonary/Chest: Effort normal and breath sounds normal.  Abdominal: Soft. Bowel sounds are normal. She exhibits no distension and no mass. There is no tenderness. There is no rebound and no guarding.   Neurological: She is alert and oriented to person, place, and time.  Skin: Skin is warm and dry.  Psychiatric: She has a normal mood and affect. Her behavior is normal.       Assessment & Plan:   Diarrhea - unclear etiology but certainly has continued to be persistent. Has not tapered off this week at all she's had persistent symptoms for 10 days. Not a lot of severe cramping and no fever. Still consider infectious causes since it did start abruptly. She also visited a group home about 2 weeks ago which certainly could put her at higher risk for other things such as C. Difficile. Though again no fever. She also has had some GI problems on and off and has wondered if over the years she was actually hasceliacperiods will go ahead and do lab work for that as well. Encouraged her to stay hydrated. Since the over-the-counter anti- diarrhealmedications have not been helpful been encouraged her to stop them. Sometimes that can prolong an illness particularly if it's more infectious. Call if suddenly getting worse or developing fever.

## 2017-02-22 NOTE — Patient Instructions (Addendum)
Diarrhea, Adult °Diarrhea is when you have loose and water poop (stool) often. Diarrhea can make you feel weak and cause you to get dehydrated. Dehydration can make you tired and thirsty, make you have a dry mouth, and make it so you pee (urinate) less often. Diarrhea often lasts 2-3 days. However, it can last longer if it is a sign of something more serious. It is important to treat your diarrhea as told by your doctor. °Follow these instructions at home: °Eating and drinking ° °Follow these recommendations as told by your doctor: °· Take an oral rehydration solution (ORS). This is a drink that is sold at pharmacies and stores. °· Drink clear fluids, such as: °? Water. °? Ice chips. °? Diluted fruit juice. °? Low-calorie sports drinks. °· Eat bland, easy-to-digest foods in small amounts as you are able. These foods include: °? Bananas. °? Applesauce. °? Rice. °? Low-fat (lean) meats. °? Toast. °? Crackers. °· Avoid drinking fluids that have a lot of sugar or caffeine in them. °· Avoid alcohol. °· Avoid spicy or fatty foods. ° °General instructions ° °· Drink enough fluid to keep your pee (urine) clear or pale yellow. °· Wash your hands often. If you cannot use soap and water, use hand sanitizer. °· Make sure that all people in your home wash their hands well and often. °· Take over-the-counter and prescription medicines only as told by your doctor. °· Rest at home while you get better. °· Watch your condition for any changes. °· Take a warm bath to help with any burning or pain from having diarrhea. °· Keep all follow-up visits as told by your doctor. This is important. °Contact a doctor if: °· You have a fever. °· Your diarrhea gets worse. °· You have new symptoms. °· You cannot keep fluids down. °· You feel light-headed or dizzy. °· You have a headache. °· You have muscle cramps. °Get help right away if: °· You have chest pain. °· You feel very weak or you pass out (faint). °· You have bloody or black poop or  poop that look like tar. °· You have very bad pain, cramping, or bloating in your belly (abdomen). °· You have trouble breathing or you are breathing very quickly. °· Your heart is beating very quickly. °· Your skin feels cold and clammy. °· You feel confused. °· You have signs of dehydration, such as: °? Dark pee, hardly any pee, or no pee. °? Cracked lips. °? Dry mouth. °? Sunken eyes. °? Sleepiness. °? Weakness. °This information is not intended to replace advice given to you by your health care provider. Make sure you discuss any questions you have with your health care provider. °Document Released: 12/01/2007 Document Revised: 01/02/2016 Document Reviewed: 02/18/2015 °Elsevier Interactive Patient Education © 2018 Elsevier Inc[employer] ° °

## 2017-02-23 LAB — COMPLETE METABOLIC PANEL WITH GFR
ALT: 10 U/L (ref 6–29)
AST: 13 U/L (ref 10–35)
Albumin: 3.6 g/dL (ref 3.6–5.1)
Alkaline Phosphatase: 106 U/L (ref 33–115)
BUN: 9 mg/dL (ref 7–25)
CO2: 20 mmol/L (ref 20–32)
Calcium: 8.6 mg/dL (ref 8.6–10.2)
Chloride: 104 mmol/L (ref 98–110)
Creat: 0.67 mg/dL (ref 0.50–1.10)
GFR, Est African American: >89 mL/min (ref >=60)
GLUCOSE: 159 mg/dL — AB (ref 65–99)
Potassium: 3.9 mmol/L (ref 3.5–5.3)
SODIUM: 136 mmol/L (ref 135–146)
TOTAL PROTEIN: 6.4 g/dL (ref 6.1–8.1)
Total Bilirubin: 0.2 mg/dL (ref 0.2–1.2)

## 2017-02-24 LAB — TISSUE TRANSGLUTAMINASE ABS,IGG,IGA
Tissue Transglut Ab: 3 U/mL (ref <6)
Tissue Transglutaminase Ab, IgA: 5 U/mL — ABNORMAL HIGH (ref <4)

## 2017-02-25 LAB — CLOSTRIDIUM DIFFICILE BY PCR: Toxigenic C. Difficile by PCR: NOT DETECTED

## 2017-02-28 LAB — STOOL CULTURE

## 2017-03-03 ENCOUNTER — Telehealth: Payer: Self-pay | Admitting: Family Medicine

## 2017-03-03 DIAGNOSIS — R197 Diarrhea, unspecified: Secondary | ICD-10-CM

## 2017-03-03 NOTE — Telephone Encounter (Signed)
Pt returned clinic call stating when she received her results yesterday she was feeling better, but last night her diarrhea came back "just as bad." Pt reports she was "up and down all night, and all today." Questions what the next step should be.   If she get a GI referral, she would prefer [place] location. Routing to PCP for review.

## 2017-03-03 NOTE — Telephone Encounter (Signed)
I agree, GI referral. And recommend start a Probiotic like Align as well.

## 2017-03-04 NOTE — Telephone Encounter (Signed)
Pt informed. Referral placed.Kristina Martinez

## 2017-03-29 ENCOUNTER — Other Ambulatory Visit: Payer: Self-pay | Admitting: Family Medicine

## 2017-03-29 ENCOUNTER — Ambulatory Visit (INDEPENDENT_AMBULATORY_CARE_PROVIDER_SITE_OTHER): Payer: 59 | Admitting: Family Medicine

## 2017-03-29 VITALS — BP 163/82 | HR 85 | Temp 98.0°F | Wt 247.0 lb

## 2017-03-29 DIAGNOSIS — K449 Diaphragmatic hernia without obstruction or gangrene: Secondary | ICD-10-CM | POA: Diagnosis not present

## 2017-03-29 DIAGNOSIS — R1012 Left upper quadrant pain: Secondary | ICD-10-CM | POA: Diagnosis not present

## 2017-03-29 MED ORDER — SUCRALFATE 1 G PO TABS: 1.0000 g | ORAL_TABLET | Freq: Four times a day (QID) | ORAL | 0 refills | Status: DC

## 2017-03-29 MED ORDER — OMEPRAZOLE 40 MG PO CPDR: 40.0000 mg | DELAYED_RELEASE_CAPSULE | Freq: Every day | ORAL | 1 refills | Status: DC

## 2017-03-29 MED ORDER — DICYCLOMINE HCL 10 MG PO CAPS: 10.0000 mg | ORAL_CAPSULE | Freq: Three times a day (TID) | ORAL | 0 refills | Status: DC

## 2017-03-29 NOTE — Progress Notes (Signed)
Kristina Martinez is a 46 y.o. female who presents to [facility] today for left-sided abdominal pain. Patient notes a several day history of left-sided abdominal pain. This worsens with eating and radiates to the shoulder. She denies any chest pain or pain with exertion. She denies any significant vomiting. She's had multiple recurrent abdominal and GI complaints recently. About a month ago she had an episode of diarrhea that was worked up and found to be idiopathic in nature. She has a history of hiatal hernia, cholecystectomy, and a previous diagnosis of IBS. Additionally as part of her workup she had mildly positive labs indicating possible celiac disease. She is eating a gluten-free diet. She denies any blood in the stool.   Past Medical History:  Diagnosis Date  . Achilles tendon injury   . ADD (attention deficit disorder)   . Depression   . IBS (irritable bowel syndrome)   . Migraine   . Obese   . Ovarian cyst   . PMDD (premenstrual dysphoric disorder)   . Vaso-vagal reaction    Past Surgical History:  Procedure Laterality Date  . LAPAROSCOPIC CHOLECYSTECTOMY  2011   Social History  Substance Use Topics  . Smoking status: Never Smoker  . Smokeless tobacco: Never Used  . Alcohol use Yes     Comment: 1/ in 6 months   family history includes Breast cancer in her maternal grandmother and paternal grandmother; Heart disease in her father; Heart failure in her father; Prostate cancer in her father; Skin cancer in her mother; Stroke (age of onset: 28) in her brother; Uterine cancer in her maternal grandmother.  ROS as above:  Medications: Current Outpatient Prescriptions  Medication Sig Dispense Refill  . ALPRAZolam (XANAX) 0.5 MG tablet TAKE 1/2 TO 1 TABLET BY MOUTH DAILY AS NEEDED 15 tablet 3  . citalopram (CELEXA) 40 MG tablet Take 1 tablet (40 mg total) by  mouth daily. (Patient taking differently: Take 20 mg by mouth daily. ) 90 tablet 0  . dicyclomine (BENTYL) 10 MG capsule Take 1 capsule (10 mg total) by mouth 4 (four) times daily -  before meals and at bedtime. 120 capsule 0  . fluticasone (FLOVENT HFA) 110 MCG/ACT inhaler Inhale 2 puffs into the lungs 2 (two) times daily. For cough 1 Inhaler 0  . levonorgestrel-ethinyl estradiol (SEASONALE,INTROVALE,JOLESSA) 0.15-0.03 MG tablet TAKE 1 TABLET BY MOUTH  DAILY 91 tablet 4  . methylphenidate (RITALIN) 20 MG tablet Take 1 tablet (20 mg total) by mouth daily as needed (for additional concentration). (Patient taking differently: Take 10 mg by mouth daily as needed (for additional concentration). ) 30 tablet 0  . omeprazole (PRILOSEC) 40 MG capsule Take 1 capsule (40 mg total) by mouth daily. 60 capsule 1  . sucralfate (CARAFATE) 1 g tablet Take 1 tablet (1 g total) by mouth 4 (four) times daily. 120 tablet 0   No current facility-administered medications for this visit.    Allergies  Allergen Reactions  . Ciprofloxacin Hives  . Zofran [Ondansetron Hcl]     Migraine     Health Maintenance Health Maintenance  Topic Date Due  . INFLUENZA VACCINE  04/27/2017 (Originally 01/26/2017)  . TETANUS/TDAP  12/13/2017  . PAP SMEAR  01/27/2018  . HIV Screening  Completed     Exam:  BP (!) 163/82   Pulse 85   Temp 98 F (36.7 C)   Wt 247 lb (112 kg)   SpO2 99%  BMI 38.69 kg/m  Gen: Well NAD HEENT: EOMI,  MMM Lungs: Normal work of breathing. CTABL Heart: RRR no MRG Abd: NABS, Soft. Nondistended, Minimally tender left upper quadrant with no rebound or guarding or masses palpated Exts: Brisk capillary refill, warm and well perfused.    No results found for this or any previous visit (from the past 72 hour(s)). No results found.    Assessment and Plan: 46 y.o. female with left upper quadrant abdominal pain. Likely gastritis versus hiatal hernia versus some other unknown diagnosis. I agree  that referral to gastrology is probably a good idea and appreciate GI Reccs. In the meantime we'll treat for gastritis with twice-daily omeprazole, Carafate. Additionally we'll treat for possible IBS with dicyclomine. Obtain limited workup listed below including CBC metabolic panel and lipase. Recheck in the near future if not improving or sooner if worsening.  Follow-up with PCP in the future to address elevated blood pressure today. I suspect the current pressures are due to pain.   Orders Placed This Encounter  Procedures  . CBC with Differential/Platelet  . COMPLETE METABOLIC PANEL WITH GFR  . Lipase   Meds ordered this encounter  Medications  . dicyclomine (BENTYL) 10 MG capsule    Sig: Take 1 capsule (10 mg total) by mouth 4 (four) times daily -  before meals and at bedtime.    Dispense:  120 capsule    Refill:  0  . sucralfate (CARAFATE) 1 g tablet    Sig: Take 1 tablet (1 g total) by mouth 4 (four) times daily.    Dispense:  120 tablet    Refill:  0  . omeprazole (PRILOSEC) 40 MG capsule    Sig: Take 1 capsule (40 mg total) by mouth daily.    Dispense:  60 capsule    Refill:  1     Discussed warning signs or symptoms. Please see discharge instructions. Patient expresses understanding.

## 2017-03-29 NOTE — Patient Instructions (Signed)
Thank you for coming in today. Get labs tomorrow.  Start omeprazole 40mg  twice daily.  Start Carafte 1 pill 4x daily with food.  Start Dicyclomine up to 4x day for gut spasm.  Follow up with Gastroenterology.  Recheck sooner if needed.     Gastritis, Adult Gastritis is inflammation of the stomach. There are two kinds of gastritis:  Acute gastritis. This kind develops suddenly.  Chronic gastritis. This kind lasts for a long time.  Gastritis happens when the lining of the stomach becomes weak or gets damaged. Without treatment, gastritis can lead to stomach bleeding and ulcers. What are the causes? This condition may be caused by:  An infection.  Drinking too much alcohol.  Certain medicines.  Having too much acid in the stomach.  A disease of the intestines or stomach.  Stress.  What are the signs or symptoms? Symptoms of this condition include:  Pain or a burning in the upper abdomen.  Nausea.  Vomiting.  An uncomfortable feeling of fullness after eating.  In some cases, there are no symptoms. How is this diagnosed? This condition may be diagnosed with:  A description of your symptoms.  A physical exam.  Tests. These can include: ? Blood tests. ? Stool tests. ? A test in which a thin, flexible instrument with a light and camera on the end is passed down the esophagus and into the stomach (upper endoscopy). ? A test in which a sample of tissue is taken for testing (biopsy).  How is this treated? This condition may be treated with medicines. If the condition is caused by a bacterial infection, you may be given antibiotic medicines. If it is caused by too much acid in the stomach, you may get medicines called H2 blockers, proton pump inhibitors, or antacids. Treatment may also involve stopping the use of certain medicines, such as aspirin, ibuprofen, or other nonsteroidal anti-inflammatory drugs (NSAIDs). Follow these instructions at home:  Take  over-the-counter and prescription medicines only as told by your health care provider.  If you were prescribed an antibiotic, take it as told by your health care provider. Do not stop taking the antibiotic even if you start to feel better.  Drink enough fluid to keep your urine clear or pale yellow.  Eat small, frequent meals instead of large meals. Contact a health care provider if:  Your symptoms get worse.  Your symptoms return after treatment. Get help right away if:  You vomit blood or material that looks like coffee grounds.  You have black or dark red stools.  You are unable to keep fluids down.  Your abdominal pain gets worse.  You have a fever.  You do not feel better after 1 week. This information is not intended to replace advice given to you by your health care provider. Make sure you discuss any questions you have with your health care provider. Document Released: 06/08/2001 Document Revised: 02/11/2016 Document Reviewed: 03/08/2015 Elsevier Interactive Patient Education  [employer].

## 2017-04-06 DIAGNOSIS — R1013 Epigastric pain: Secondary | ICD-10-CM | POA: Diagnosis not present

## 2017-04-06 DIAGNOSIS — D509 Iron deficiency anemia, unspecified: Secondary | ICD-10-CM | POA: Diagnosis not present

## 2017-04-06 DIAGNOSIS — R109 Unspecified abdominal pain: Secondary | ICD-10-CM | POA: Diagnosis not present

## 2017-04-06 DIAGNOSIS — R197 Diarrhea, unspecified: Secondary | ICD-10-CM | POA: Diagnosis not present

## 2017-04-06 DIAGNOSIS — R1012 Left upper quadrant pain: Secondary | ICD-10-CM | POA: Diagnosis not present

## 2017-04-06 DIAGNOSIS — R110 Nausea: Secondary | ICD-10-CM | POA: Diagnosis not present

## 2017-04-27 DIAGNOSIS — Z23 Encounter for immunization: Secondary | ICD-10-CM | POA: Diagnosis not present

## 2017-05-24 DIAGNOSIS — R1013 Epigastric pain: Secondary | ICD-10-CM | POA: Diagnosis not present

## 2017-05-24 DIAGNOSIS — D509 Iron deficiency anemia, unspecified: Secondary | ICD-10-CM | POA: Diagnosis not present

## 2017-05-24 DIAGNOSIS — R197 Diarrhea, unspecified: Secondary | ICD-10-CM | POA: Diagnosis not present

## 2017-05-24 DIAGNOSIS — R110 Nausea: Secondary | ICD-10-CM | POA: Diagnosis not present

## 2017-05-24 LAB — HM COLONOSCOPY

## 2017-05-31 ENCOUNTER — Other Ambulatory Visit: Payer: Self-pay | Admitting: Family Medicine

## 2017-06-08 ENCOUNTER — Other Ambulatory Visit: Payer: Self-pay | Admitting: Family Medicine

## 2017-06-13 ENCOUNTER — Encounter: Payer: Self-pay | Admitting: *Deleted

## 2017-06-30 ENCOUNTER — Other Ambulatory Visit: Payer: Self-pay | Admitting: Family Medicine

## 2017-07-12 ENCOUNTER — Telehealth: Payer: Self-pay

## 2017-07-12 DIAGNOSIS — F988 Other specified behavioral and emotional disorders with onset usually occurring in childhood and adolescence: Secondary | ICD-10-CM

## 2017-07-12 MED ORDER — CITALOPRAM HYDROBROMIDE 20 MG PO TABS: 20.0000 mg | ORAL_TABLET | Freq: Every day | ORAL | 0 refills | Status: DC

## 2017-07-12 MED ORDER — METHYLPHENIDATE HCL 20 MG PO TABS: 20.0000 mg | ORAL_TABLET | Freq: Every day | ORAL | 0 refills | Status: DC | PRN

## 2017-07-12 MED ORDER — METHYLPHENIDATE HCL 10 MG PO TABS: 10.0000 mg | ORAL_TABLET | Freq: Two times a day (BID) | ORAL | 0 refills | Status: DC

## 2017-07-12 NOTE — Telephone Encounter (Signed)
Kristina Martinez called back and states she talked with Dr Madilyn Fireman about reducing the Methylphenidate to 10 mg and to take it up to twice daily. She also mentioned the Citalopram was changed to 20 mg once daily. She states it was never updated in the chart. Please advise.   See note below.

## 2017-07-12 NOTE — Telephone Encounter (Signed)
Patient would like it sent to local pharmacy. I called and cancelled the one to [facility].

## 2017-07-12 NOTE — Telephone Encounter (Signed)
Kristina Martinez called and states she needs a refill on Methylphenidate. She has not been seen for ADD 02/26/2016. I advised her she needs an appointment. She upset because she states we tell her something different every time she calls. She said she may need to find a different provider. I advised patient I would send the message.

## 2017-07-12 NOTE — Telephone Encounter (Signed)
Med refilled to [facility] for now but with the new regulations around scheduled drugs she needs to be seen every 6 months for the ADD. I did refill it in August when she came in but it wasn't really a f/u for ADD. It was an acute visit and I filled it at that time.  Please try to schedule in next month or two if able.

## 2017-07-12 NOTE — Telephone Encounter (Signed)
Okay, new prescription sent for the methylphenidate 10 mg.  I will also send over new for the 20 mg citalopram to [facility] as well.  Will need to call and have them cancel the 40 mg tabs.

## 2017-07-13 NOTE — Telephone Encounter (Signed)
Left message advising or recommendations.  

## 2017-07-25 DIAGNOSIS — R110 Nausea: Secondary | ICD-10-CM | POA: Diagnosis not present

## 2017-07-25 DIAGNOSIS — R197 Diarrhea, unspecified: Secondary | ICD-10-CM | POA: Diagnosis not present

## 2017-07-25 DIAGNOSIS — R109 Unspecified abdominal pain: Secondary | ICD-10-CM | POA: Diagnosis not present

## 2017-07-25 DIAGNOSIS — D509 Iron deficiency anemia, unspecified: Secondary | ICD-10-CM | POA: Diagnosis not present

## 2017-08-01 DIAGNOSIS — R197 Diarrhea, unspecified: Secondary | ICD-10-CM | POA: Diagnosis not present

## 2017-08-01 DIAGNOSIS — K5900 Constipation, unspecified: Secondary | ICD-10-CM | POA: Diagnosis not present

## 2017-08-01 DIAGNOSIS — R110 Nausea: Secondary | ICD-10-CM | POA: Diagnosis not present

## 2017-08-01 DIAGNOSIS — R109 Unspecified abdominal pain: Secondary | ICD-10-CM | POA: Diagnosis not present

## 2017-08-17 DIAGNOSIS — D473 Essential (hemorrhagic) thrombocythemia: Secondary | ICD-10-CM | POA: Diagnosis not present

## 2017-08-17 DIAGNOSIS — D721 Eosinophilia: Secondary | ICD-10-CM | POA: Diagnosis not present

## 2017-08-17 DIAGNOSIS — D500 Iron deficiency anemia secondary to blood loss (chronic): Secondary | ICD-10-CM | POA: Diagnosis not present

## 2017-08-17 DIAGNOSIS — R898 Other abnormal findings in specimens from other organs, systems and tissues: Secondary | ICD-10-CM | POA: Insufficient documentation

## 2017-08-29 DIAGNOSIS — D500 Iron deficiency anemia secondary to blood loss (chronic): Secondary | ICD-10-CM | POA: Diagnosis not present

## 2017-09-08 DIAGNOSIS — K219 Gastro-esophageal reflux disease without esophagitis: Secondary | ICD-10-CM | POA: Insufficient documentation

## 2017-09-08 DIAGNOSIS — R1084 Generalized abdominal pain: Secondary | ICD-10-CM | POA: Insufficient documentation

## 2017-09-08 DIAGNOSIS — K580 Irritable bowel syndrome with diarrhea: Secondary | ICD-10-CM | POA: Insufficient documentation

## 2017-09-13 DIAGNOSIS — K580 Irritable bowel syndrome with diarrhea: Secondary | ICD-10-CM | POA: Diagnosis not present

## 2017-09-13 DIAGNOSIS — R110 Nausea: Secondary | ICD-10-CM | POA: Diagnosis not present

## 2017-09-13 DIAGNOSIS — R1084 Generalized abdominal pain: Secondary | ICD-10-CM | POA: Diagnosis not present

## 2017-09-19 DIAGNOSIS — Z7689 Persons encountering health services in other specified circumstances: Secondary | ICD-10-CM | POA: Diagnosis not present

## 2017-09-28 DIAGNOSIS — K219 Gastro-esophageal reflux disease without esophagitis: Secondary | ICD-10-CM | POA: Diagnosis not present

## 2017-09-28 DIAGNOSIS — D75838 Other thrombocytosis: Secondary | ICD-10-CM | POA: Insufficient documentation

## 2017-09-28 DIAGNOSIS — R7989 Other specified abnormal findings of blood chemistry: Secondary | ICD-10-CM | POA: Diagnosis not present

## 2017-09-28 DIAGNOSIS — D500 Iron deficiency anemia secondary to blood loss (chronic): Secondary | ICD-10-CM | POA: Diagnosis not present

## 2017-09-28 DIAGNOSIS — D473 Essential (hemorrhagic) thrombocythemia: Secondary | ICD-10-CM | POA: Diagnosis not present

## 2017-09-28 DIAGNOSIS — K648 Other hemorrhoids: Secondary | ICD-10-CM | POA: Diagnosis not present

## 2017-09-29 ENCOUNTER — Other Ambulatory Visit: Payer: Self-pay

## 2017-09-29 ENCOUNTER — Emergency Department (INDEPENDENT_AMBULATORY_CARE_PROVIDER_SITE_OTHER)
Admission: EM | Admit: 2017-09-29 | Discharge: 2017-09-29 | Disposition: A | Discharge status: Home / Self Care | Payer: 59 | Source: Home / Self Care | Attending: Family Medicine | Admitting: Family Medicine

## 2017-09-29 ENCOUNTER — Emergency Department (INDEPENDENT_AMBULATORY_CARE_PROVIDER_SITE_OTHER): Payer: 59

## 2017-09-29 DIAGNOSIS — M79672 Pain in left foot: Secondary | ICD-10-CM

## 2017-09-29 DIAGNOSIS — M67472 Ganglion, left ankle and foot: Secondary | ICD-10-CM

## 2017-09-29 NOTE — Discharge Instructions (Addendum)
May take Ibuprofen 200mg, 4 tabs every 8 hours with food.  °

## 2017-09-29 NOTE — ED Triage Notes (Signed)
Pt denies injury, has been going on for about 1 month.  She is having pain from the 4th toe toward the ankle.

## 2017-09-29 NOTE — ED Provider Notes (Signed)
[facility]    CSN: [id number] Arrival date & time: 09/29/17  1103     History   Chief Complaint Chief Complaint  Patient presents with  . Foot Pain    HPI Kristina Martinez is a 47 y.o. female.   Patient complains of onset of a "bump" on the dorsum of her left foot about 3.5 weeks ago.  She recalls no injury.  She has pain when walking, especially when wearing heels.  The history is provided by the patient.  Foot Pain  This is a new problem. Episode onset: 3.5 weeks ago. The problem occurs constantly. The problem has not changed since onset.The symptoms are aggravated by walking. Nothing relieves the symptoms. She has tried nothing for the symptoms.    Past Medical History:  Diagnosis Date  . Achilles tendon injury   . ADD (attention deficit disorder)   . Depression   . IBS (irritable bowel syndrome)   . Migraine   . Obese   . Ovarian cyst   . PMDD (premenstrual dysphoric disorder)   . Vaso-vagal reaction     Patient Active Problem List   Diagnosis Date Noted  . Dysfunction of left rotator cuff 09/29/2016  . Pulmonary nodule, right 06/02/2016  . Needs flu shot 02/26/2016  . Primary osteoarthritis of left knee 01/09/2015  . Rosacea 11/01/2013  . Neuropathy of right ulnar nerve at wrist 06/05/2013  . HLD (hyperlipidemia) 04/16/2013  . Plantar fasciitis, bilateral 12/08/2012  . Anxiety 03/10/2012  . Major depressive disorder, recurrent episode, moderate ([place]) 07/14/2011  . Cervical radiculopathy 01/23/2011  . CARDIAC MURMUR 07/01/2010  . NONSPECIFIC MESENTERIC LYMPHADENITIS 01/29/2010  . NIGHT SWEATS 01/28/2010  . BACK PAIN 01/12/2010  . HEADACHE 09/16/2009  . [place] DISEASE, LUMBAR 06/24/2009  . SINUSITIS, CHRONIC 06/04/2009  . ALKALINE PHOSPHATASE, ELEVATED 03/19/2009  . Diaphragmatic hernia 12/26/2008  . Other and unspecified ovarian cyst 12/17/2008  . Attention deficit disorder 10/31/2007    Past Surgical History:  Procedure Laterality Date    . LAPAROSCOPIC CHOLECYSTECTOMY  2011    OB History   None      Home Medications    Prior to Admission medications   Medication Sig Start Date End Date Taking? Authorizing Provider  ALPRAZolam Duanne Moron) 0.5 MG tablet TAKE 1/2 TO 1 TABLET BY MOUTH DAILY AS NEEDED 09/01/16   Hali Marry, MD  citalopram (CELEXA) 20 MG tablet Take 1 tablet (20 mg total) by mouth daily. LAST REFILL.PLEASE CALL OFFICE AND SCHEDULE AN APPOINTMENT 10/05/17   Hali Marry, MD  dicyclomine (BENTYL) 10 MG capsule Take 1 capsule (10 mg total) by mouth 4 (four) times daily -  before meals and at bedtime. 03/29/17   Gregor Hams, MD  fluticasone (FLOVENT HFA) 110 MCG/ACT inhaler Inhale 2 puffs into the lungs 2 (two) times daily. For cough 12/04/15   Emeterio Reeve, DO  levonorgestrel-ethinyl estradiol (SEASONALE,INTROVALE,JOLESSA) 0.15-0.03 MG tablet TAKE 1 TABLET BY MOUTH  DAILY 06/09/17   Hali Marry, MD  methylphenidate (RITALIN) 10 MG tablet Take 1 tablet (10 mg total) by mouth 2 (two) times daily with breakfast and lunch. 07/12/17   Hali Marry, MD  omeprazole (PRILOSEC) 40 MG capsule TAKE 1 CAPSULE(40 MG) BY MOUTH DAILY 05/31/17   Gregor Hams, MD  sucralfate (CARAFATE) 1 g tablet Take 1 tablet (1 g total) by mouth 4 (four) times daily. 03/29/17   Gregor Hams, MD    Family History Family History  Problem Relation Age  of Onset  . Skin cancer Mother        skin  . Prostate cancer Father   . Heart disease Father        AMI  . Heart failure Father   . Stroke Brother 40  . Breast cancer Maternal Grandmother   . Uterine cancer Maternal Grandmother        ? cervix  . Breast cancer Paternal Grandmother     Social History Social History   Tobacco Use  . Smoking status: Never Smoker  . Smokeless tobacco: Never Used  Substance Use Topics  . Alcohol use: Yes    Comment: 1/ in 6 months  . Drug use: No     Allergies   Ciprofloxacin and Zofran [ondansetron  hcl]   Review of Systems Review of Systems  All other systems reviewed and are negative.    Physical Exam Triage Vital Signs ED Triage Vitals  Enc Vitals Group     BP 09/29/17 1142 (!) 148/93     Pulse Rate 09/29/17 1142 78     Resp --      Temp 09/29/17 1142 98.5 F (36.9 C)     Temp Source 09/29/17 1142 Oral     SpO2 09/29/17 1142 96 %     Weight 09/29/17 1143 243 lb (110.2 kg)     Height 09/29/17 1143 5\' 7"  (1.702 m)     Head Circumference --      Peak Flow --      Pain Score 09/29/17 1143 3     Pain Loc --      Pain Edu? --      Excl. in [place]? --    No data found.  Updated Vital Signs BP (!) 148/93 (BP Location: Right Arm)   Pulse 78   Temp 98.5 F (36.9 C) (Oral)   Ht 5\' 7"  (1.702 m)   Wt 243 lb (110.2 kg)   SpO2 96%   BMI 38.06 kg/m   Visual Acuity Right Eye Distance:   Left Eye Distance:   Bilateral Distance:    Right Eye Near:   Left Eye Near:    Bilateral Near:     Physical Exam  Constitutional: She appears well-developed and well-nourished. No distress.  HENT:  Head: Normocephalic.  Eyes: Pupils are equal, round, and reactive to light.  Cardiovascular: Normal rate.  Pulmonary/Chest: Effort normal.  Musculoskeletal:       Left foot: There is tenderness. There is normal range of motion, no bony tenderness, no swelling, normal capillary refill, no crepitus and no deformity.       Feet:  Tender subcutaneous nodule over dorsum of 4th and 5th metatarsals.  Neurological: She is alert.  Skin: Skin is warm and dry.  Nursing note and vitals reviewed.    UC Treatments / Results  Labs (all labs ordered are listed, but only abnormal results are displayed) Labs Reviewed - No data to display  EKG None Radiology CLINICAL DATA:  Left foot pain for several weeks without known injury.  EXAM: LEFT FOOT - COMPLETE 3+ VIEW  COMPARISON:  None.  FINDINGS: There is no evidence of fracture or dislocation. There is no evidence of arthropathy or  other focal bone abnormality. Soft tissues are unremarkable.  IMPRESSION: Normal left foot.   Electronically Signed   By: Marijo Conception, M.D.   On: 09/29/2017 12:47  Procedures Procedures (including critical care time)  Medications Ordered in UC Medications - No data to display  Initial Impression / Assessment and Plan / UC Course  I have reviewed the triage vital signs and the nursing notes.  Pertinent labs & imaging results that were available during my care of the patient were reviewed by me and considered in my medical decision making (see chart for details).    May take Ibuprofen 200mg , 4 tabs every 8 hours with food.  Will refer to Dr. Aundria Mems for further management.    Final Clinical Impressions(s) / UC Diagnoses   Final diagnoses:  Ganglion cyst of left foot    ED Discharge Orders    None          Kandra Nicolas, MD 10/05/17 [phone]

## 2017-10-04 ENCOUNTER — Institutional Professional Consult (permissible substitution): Payer: Self-pay | Admitting: Sports Medicine

## 2017-10-05 ENCOUNTER — Other Ambulatory Visit: Payer: Self-pay | Admitting: Family Medicine

## 2017-11-07 DIAGNOSIS — D500 Iron deficiency anemia secondary to blood loss (chronic): Secondary | ICD-10-CM | POA: Diagnosis not present

## 2017-11-07 DIAGNOSIS — K219 Gastro-esophageal reflux disease without esophagitis: Secondary | ICD-10-CM | POA: Diagnosis not present

## 2017-11-07 DIAGNOSIS — R110 Nausea: Secondary | ICD-10-CM | POA: Diagnosis not present

## 2017-11-07 DIAGNOSIS — K580 Irritable bowel syndrome with diarrhea: Secondary | ICD-10-CM | POA: Diagnosis not present

## 2017-12-01 ENCOUNTER — Other Ambulatory Visit: Payer: Self-pay | Admitting: *Deleted

## 2017-12-01 ENCOUNTER — Other Ambulatory Visit: Payer: Self-pay | Admitting: Family Medicine

## 2017-12-15 ENCOUNTER — Ambulatory Visit (INDEPENDENT_AMBULATORY_CARE_PROVIDER_SITE_OTHER): Payer: 59 | Admitting: Family Medicine

## 2017-12-15 ENCOUNTER — Encounter: Payer: Self-pay | Admitting: Family Medicine

## 2017-12-15 VITALS — BP 134/84 | HR 84 | Ht 67.0 in | Wt 245.0 lb

## 2017-12-15 DIAGNOSIS — M4310 Spondylolisthesis, site unspecified: Secondary | ICD-10-CM

## 2017-12-15 DIAGNOSIS — M4306 Spondylolysis, lumbar region: Secondary | ICD-10-CM

## 2017-12-15 DIAGNOSIS — F988 Other specified behavioral and emotional disorders with onset usually occurring in childhood and adolescence: Secondary | ICD-10-CM | POA: Diagnosis not present

## 2017-12-15 DIAGNOSIS — M461 Sacroiliitis, not elsewhere classified: Secondary | ICD-10-CM | POA: Diagnosis not present

## 2017-12-15 DIAGNOSIS — F331 Major depressive disorder, recurrent, moderate: Secondary | ICD-10-CM | POA: Diagnosis not present

## 2017-12-15 MED ORDER — ALPRAZOLAM 0.5 MG PO TABS: ORAL_TABLET | ORAL | 3 refills | Status: DC

## 2017-12-15 MED ORDER — METHYLPHENIDATE HCL 10 MG PO TABS: 10.0000 mg | ORAL_TABLET | Freq: Two times a day (BID) | ORAL | 0 refills | Status: DC

## 2017-12-15 NOTE — Progress Notes (Signed)
Subjective:    CC:   HPI:   ADD -she has not taken her medicine in quite some time.  She just notes that she feels like she is tired of feeling like she is a ping-pong ball.  She recently took some leftover medication and it really did make a difference in her concentration ability so she would like to restart her Concerta.  F/U MDD - she is doing well on regimen.  New RX. Sent. Xanax refilled as well. Using sparingly.   She also complains of right low back pain.  She says it started in December.  She does not remember any injury or trauma and never had any major problems prior to that.  She is been using ibuprofen as needed for relief.  She also used her husband's TENS unit and says that was helpful as well.  She sometimes gets some pain on that right outer hip as well but says the pain does not necessarily radiate from her right low back.  Occasionally will bother her on the left side as well.  It does come and go.  On a CT done back in 2014 she had a L5 pars defect with advanced degenerative disc disease and some mild anterior listhesis.  Past medical history, Surgical history, Family history not pertinant except as noted below, Social history, Allergies, and medications have been entered into the medical record, reviewed, and corrections made.   Review of Systems: No fevers, chills, night sweats, weight loss, chest pain, or shortness of breath.   Objective:    General: Well Developed, well nourished, and in no acute distress.  Neuro: Alert and oriented x3, extra-ocular muscles intact, sensation grossly intact.  HEENT: Normocephalic, atraumatic  Skin: Warm and dry, no rashes. Cardiac: Regular rate and rhythm, no murmurs rubs or gallops, no lower extremity edema.  Respiratory: Clear to auscultation bilaterally. Not using accessory muscles, speaking in full sentences. MSK: He is tender over the lower lumbar spine just near the sacrum.  And she is also tender over the right SI joint.  When I  palpated the SI joint she said that were most of her pain has really been bothering her.  Normal lumbar flexion, extension, rotation right left, side bending.  Hip, knee, ankle strength is 5 out of 5 bilaterally.  Patellar reflexes 1+ bilaterally.  Impression and Recommendations:   ADD - Well controlled. Continue current regimen. Follow up in  19months.    Right SI joint pain/inflammation-suspect she probably has some arthritis there.  Recommend formal physical therapy as she also has an L5 pars defect with some mild anterior listhesis seen on CT back in 2014.  If not improving then consider injection by 1 of our sports medicine providers.  MDD - RF medications. F/U in 25months.

## 2017-12-19 ENCOUNTER — Ambulatory Visit: Payer: 59 | Admitting: Rehabilitative and Restorative Service Providers"

## 2017-12-19 ENCOUNTER — Encounter: Payer: Self-pay | Admitting: Rehabilitative and Restorative Service Providers"

## 2017-12-19 DIAGNOSIS — M545 Low back pain: Secondary | ICD-10-CM

## 2017-12-19 DIAGNOSIS — R531 Weakness: Secondary | ICD-10-CM

## 2017-12-19 DIAGNOSIS — R293 Abnormal posture: Secondary | ICD-10-CM

## 2017-12-19 DIAGNOSIS — G8929 Other chronic pain: Secondary | ICD-10-CM | POA: Diagnosis not present

## 2017-12-19 NOTE — Patient Instructions (Signed)
Avoid sitting with legs crossed, ankles crossed; sitting with one foot tucked under hips; etc  Abdominal Bracing With Pelvic Floor (Hook-Lying)    With neutral spine, tighten pelvic floor and abdominals sucking belly button to back bone; tighten muscles in low back at waist. Hold 10 sed  Repeat _10__ times. Do _several __ times a day   Prop on forearms on firm surface 1-2 min 2 times/day No pain!  HIP: Hamstrings - Supine  Place strap around foot. Raise leg up, keeping knee straight.  Bend opposite knee to protect back if indicated. Hold 30 seconds. 3 reps per set, 2-3 sets per day  Outer Hip Stretch: Reclined IT Band Stretch (Strap)   Strap around one foot, pull leg across body until you feel a pull or stretch in the outside of your hip, with shoulders on mat. Hold for 30 seconds. Repeat 3 times each leg. 2-3 times/day.  Piriformis Stretch   Lying on back, pull right knee toward opposite shoulder. Hold 30 seconds. Repeat 3 times. Do 2-3 sessions per day.   Quads / HF, Supine   Lie near edge of bed, pull both knees up toward chest. Hold one knee as you drop the other leg off the edge of the bed.  Relax hanging knee/can bend knee back if indicated. Hold 30 seconds. Repeat 3 times per session. Do 2-3 sessions per day.     Quads / HF, Prone KNEE: Quadriceps - Prone    Place strap around ankle. Bring ankle toward buttocks. Press hip into surface. Hold 30 seconds. Repeat 3 times per session. Do 2-3 sessions per day.  Sleeping on Back  Place pillow under knees. A pillow with cervical support and a roll around waist are also helpful. Copyright  VHI. All rights reserved.  Sleeping on Side Place pillow between knees. Use cervical support under neck and a roll around waist as needed. Copyright  VHI. All rights reserved.   Sleeping on Stomach   If this is the only desirable sleeping position, place pillow under lower legs, and under stomach or chest as  needed.  Posture - Sitting   Sit upright, head facing forward. Try using a roll to support lower back. Keep shoulders relaxed, and avoid rounded back. Keep hips level with knees. Avoid crossing legs for long periods. Stand to Sit / Sit to Stand   To sit: Bend knees to lower self onto front edge of chair, then scoot back on seat. To stand: Reverse sequence by placing one foot forward, and scoot to front of seat. Use rocking motion to stand up.   Work Height and Reach  Ideal work height is no more than 2 to 4 inches below elbow level when standing, and at elbow level when sitting. Reaching should be limited to arm's length, with elbows slightly bent.  Bending  Bend at hips and knees, not back. Keep feet shoulder-width apart.    Posture - Standing   Good posture is important. Avoid slouching and forward head thrust. Maintain curve in low back and align ears over shoul- ders, hips over ankles.  Alternating Positions   Alternate tasks and change positions frequently to reduce fatigue and muscle tension. Take rest breaks. Computer Work   Position work to Programmer, multimedia. Use proper work and seat height. Keep shoulders back and down, wrists straight, and elbows at right angles. Use chair that provides full back support. Add footrest and lumbar roll as needed.  Getting Into / Out of Car  Lower self  onto seat, scoot back, then bring in one leg at a time. Reverse sequence to get out.  Dressing  Lie on back to pull socks or slacks over feet, or sit and bend leg while keeping back straight.    Housework - Sink  Place one foot on ledge of cabinet under sink when standing at sink for prolonged periods.   Pushing / Pulling  Pushing is preferable to pulling. Keep back in proper alignment, and use leg muscles to do the work.  Deep Squat   Squat and lift with both arms held against upper trunk. Tighten stomach muscles without holding breath. Use smooth movements to avoid jerking.   Avoid Twisting   Avoid twisting or bending back. Pivot around using foot movements, and bend at knees if needed when reaching for articles.  Carrying Luggage   Distribute weight evenly on both sides. Use a cart whenever possible. Do not twist trunk. Move body as a unit.   Lifting Principles .Maintain proper posture and head alignment. .Slide object as close as possible before lifting. .Move obstacles out of the way. .Test before lifting; ask for help if too heavy. .Tighten stomach muscles without holding breath. .Use smooth movements; do not jerk. .Use legs to do the work, and pivot with feet. .Distribute the work load symmetrically and close to the center of trunk. .Push instead of pull whenever possible.   Ask For Help   Ask for help and delegate to others when possible. Coordinate your movements when lifting together, and maintain the low back curve.  Log Roll   Lying on back, bend left knee and place left arm across chest. Roll all in one movement to the right. Reverse to roll to the left. Always move as one unit. Housework - Sweeping  Use long-handled equipment to avoid stooping.   Housework - Wiping  Position yourself as close as possible to reach work surface. Avoid straining your back.  Laundry - Unloading Wash   To unload small items at bottom of washer, lift leg opposite to arm being used to reach.  [place] close to area to be raked. Use arm movements to do the work. Keep back straight and avoid twisting.     Cart  When reaching into cart with one arm, lift opposite leg to keep back straight.   Getting Into / Out of Bed  Lower self to lie down on one side by raising legs and lowering head at the same time. Use arms to assist moving without twisting. Bend both knees to roll onto back if desired. To sit up, start from lying on side, and use same move-ments in reverse. Housework - Vacuuming  Hold the vacuum with arm held at side.  Step back and forth to move it, keeping head up. Avoid twisting.   Laundry - IT consultant so that bending and twisting can be avoided.   Laundry - Unloading Dryer  Squat down to reach into clothes dryer or use a reacher.  Gardening - Weeding / Probation officer or Kneel. Knee pads may be helpful.

## 2017-12-19 NOTE — Therapy (Signed)
[place], [place], [address] Phone: [phone]   Fax:  [phone]  Physical Therapy Evaluation  Patient Details  Name: Kristina Martinez MRN: [id number] Date of Birth: [birth year] Referring Provider: Dr Madilyn Fireman   Encounter Date: 12/19/2017  PT End of Session - 12/19/17 1404    Visit Number  1    Number of Visits  12    Date for PT Re-Evaluation  01/30/18    PT Start Time  1401    PT Stop Time  1500    PT Time Calculation (min)  59 min    Activity Tolerance  Patient tolerated treatment well       Past Medical History:  Diagnosis Date  . Achilles tendon injury   . ADD (attention deficit disorder)   . Depression   . IBS (irritable bowel syndrome)   . Migraine   . Obese   . Ovarian cyst   . PMDD (premenstrual dysphoric disorder)   . Vaso-vagal reaction     Past Surgical History:  Procedure Laterality Date  . LAPAROSCOPIC CHOLECYSTECTOMY  2011    There were no vitals filed for this visit.   Subjective Assessment - 12/19/17 1408    Subjective  Patient reports onset of LBP 12/18 with no known injury. She has pain on a daily basis withvariable intensity     Pertinent History  shoulder pain; gall bladder surgery, depression     How long can you sit comfortably?  1 hour     How long can you stand comfortably?  20-30 min     How long can you walk comfortably?  20-30 min     Diagnostic tests  xrays     Patient Stated Goals  get rid of the back pain     Currently in Pain?  Yes    Pain Score  3     Pain Location  Back    Pain Orientation  Right;Left;Lower    Pain Descriptors / Indicators  Nagging    Pain Type  Chronic pain    Pain Radiating Towards  sometimes to Rt hip area - knife like pain; tightness in the Rt hip     Pain Onset  More than a month ago    Pain Frequency  Intermittent    Aggravating Factors   working; sleeping; bowel movement; after actiivies involving lifting, bending,  moving    Pain Relieving Factors  OTC meds; TENS; husband pulling on ankle on Rt side          [facility] PT Assessment - 12/19/17 0001      Assessment   Medical Diagnosis  LBP/SI dysfunction     Referring Provider  Dr Madilyn Fireman    Onset Date/Surgical Date  05/28/17    Hand Dominance  Right    Next MD Visit  PRN     Prior Therapy  yes Lt shoulder       Precautions   Precautions  None      Balance Screen   Has the patient fallen in the past 6 months  Yes    How many times?  1    Has the patient had a decrease in activity level because of a fear of falling?   No    Is the patient reluctant to leave their home because of a fear of falling?   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 reaching; sitting; on floor; bending     Leisure  household chores - otherwise sedentary       Observation/Other Assessments   Focus on Therapeutic Outcomes (FOTO)   39% limitation       Sensation   Additional Comments  WNL's per pt report       Posture/Postural Control   Posture Comments  head forward; shoulders rounded and elevated       AROM   Lumbar Flexion  95%    Lumbar Extension  80%    Lumbar - Right Side Bend  75% with discomfort Rt SI area     Lumbar - Left Side Bend  85%    Lumbar - Right Rotation  WNL's    Lumbar - Left Rotation  WNL's       Strength   Overall Strength Comments  5/5 bilat LE's      Flexibility   Hamstrings  80-85deg bilat     Quadriceps  tight bilat     ITB  tight Rt > Lt    Piriformis  tight Rt > Lt       Palpation   Spinal mobility  hypomobility and pain with CPA and lateral mobs L3/4/5 to sacrum     SI assessment   symmetrical in standing     Palpation comment  tender to palpation through bilat lumbar paraspinals; Rt sacral border; piriformis; gluts                 Objective measurements completed on examination: See above  findings.      [facility] Adult PT Treatment/Exercise - 12/19/17 0001      Self-Care   Self-Care  -- back care - avoid sitting with LE's crossed/one foot tucked       Lumbar Exercises: Stretches   Passive Hamstring Stretch  Right;Left;2 reps;30 seconds supine with strap     Hip Flexor Stretch  Right;Left;2 reps;30 seconds    Hip Flexor Stretch Limitations  hip flexor stretch in sitting 30 sec x 2 each side     Prone on Elbows Stretch  2 reps;60 seconds    Quad Stretch  Right;Left;2 reps;30 seconds prone with strap     Piriformis Stretch  Right;Left;2 reps;30 seconds supine travell       Lumbar Exercises: Supine   Other Supine Lumbar Exercises  3 part core 10 sec x 10 difficulty with multifidi contraction       Moist Heat Therapy   Number Minutes Moist Heat  20 Minutes    Moist Heat Location  Lumbar Spine SI - bilat       Electrical Stimulation   Electrical Stimulation Location  bilat SI; Rt posterior hip/pirifromis- gluts     Electrical Stimulation Action  IFC    Electrical Stimulation Parameters  to tolerance    Electrical Stimulation Goals  Pain;Tone             PT Education - 12/19/17 1509    Education Details  HEP; back care ed     Person(s) Educated  Patient    Methods  Explanation;Demonstration;Tactile cues;Verbal cues;Handout    Comprehension  Verbalized understanding;Returned demonstration;Verbal cues required;Tactile cues required          PT Long Term Goals - 12/19/17 1514      PT LONG TERM GOAL #1   Title  Improve posture and alignment allowing patient  to demonstrate upright posture and neutral position of spine in standing and sitting 01/30/18    Time  6    Period  Weeks    Status  New      PT LONG TERM GOAL #2   Title  Increase AROM lumbar spine to WNL's and without pain with ROM 01/30/18    Time  6    Period  Weeks    Status  New      PT LONG TERM GOAL #3   Title  Improve core strength allowing patient to perform normal functional activities  without LBP 01/30/18    Time  6    Period  Weeks    Status  On-going      PT LONG TERM GOAL #4   Title  Independent in HEP 01/30/18    Time  6    Period  Weeks    Status  New      PT LONG TERM GOAL #5   Title  Improve FOTO to </= 30% limitation 01/30/18    Time  6    Period  Weeks    Status  New             Plan - 12/19/17 1509    Clinical Impression Statement  Patient presents with ~ 7 month history of LBP with no known injury. She has daily pain of variable intensity with symptoms worse after she does more activites. Patient has muscular tightness through the lumbar paraspinals; Rt > Lt SI area and posterior lateral hip in area of piriformis and glut med. She has poor core strength and stability and poor sitting postures. Patient will benefit from PT to address problems identifited.     History and Personal Factors relevant to plan of care:  history of Rt shoulder pain treated here in past     Clinical Presentation  Stable    Clinical Presentation due to:  chronic nature of symptoms; occupation; sedentary lifestyle     Clinical Decision Making  Low    Rehab Potential  Good    PT Frequency  2x / week    PT Duration  6 weeks    PT Treatment/Interventions  Patient/family education;ADLs/Self Care Home Management;Cryotherapy;Electrical Stimulation;Iontophoresis 4mg /ml Dexamethasone;Moist Heat;Ultrasound;Dry needling;Manual techniques;Neuromuscular re-education;Therapeutic activities;Therapeutic exercise    PT Next Visit Plan  review HEP; add core stabilization; manual work lumbar and Rt posterior hip; modalities as indicated; consider ionto Rt SI area     Consulted and Agree with Plan of Care  Patient       Patient will benefit from skilled therapeutic intervention in order to improve the following deficits and impairments:  Postural dysfunction, Improper body mechanics, Pain, Increased fascial restricitons, Increased muscle spasms, Hypomobility, Decreased mobility, Decreased range of  motion, Decreased activity tolerance  Visit Diagnosis: Chronic bilateral low back pain without sciatica - Plan: PT plan of care cert/re-cert  Abnormal posture - Plan: PT plan of care cert/re-cert  Weakness generalized - Plan: PT plan of care cert/re-cert     Problem List Patient Active Problem List   Diagnosis Date Noted  . Dysfunction of left rotator cuff 09/29/2016  . Pulmonary nodule, right 06/02/2016  . Needs flu shot 02/26/2016  . Primary osteoarthritis of left knee 01/09/2015  . Rosacea 11/01/2013  . Neuropathy of right ulnar nerve at wrist 06/05/2013  . HLD (hyperlipidemia) 04/16/2013  . Plantar fasciitis, bilateral 12/08/2012  . Anxiety 03/10/2012  . Major depressive disorder, recurrent episode, moderate ([place]) 07/14/2011  . Cervical  radiculopathy 01/23/2011  . CARDIAC MURMUR 07/01/2010  . NONSPECIFIC MESENTERIC LYMPHADENITIS 01/29/2010  . NIGHT SWEATS 01/28/2010  . BACK PAIN 01/12/2010  . HEADACHE 09/16/2009  . [place] DISEASE, LUMBAR 06/24/2009  . SINUSITIS, CHRONIC 06/04/2009  . ALKALINE PHOSPHATASE, ELEVATED 03/19/2009  . Diaphragmatic hernia 12/26/2008  . Other and unspecified ovarian cyst 12/17/2008  . Attention deficit disorder 10/31/2007    Celyn Nilda Simmer PT, MPH  12/19/2017, 3:20 PM  [facility] [place], [place], [address] Phone: [phone]   Fax:  [phone]  Name: FLOYE FESLER MRN: [id number] Date of Birth: 04/28/1971

## 2017-12-21 ENCOUNTER — Ambulatory Visit: Payer: 59 | Admitting: Rehabilitative and Restorative Service Providers"

## 2017-12-21 ENCOUNTER — Encounter: Payer: Self-pay | Admitting: Rehabilitative and Restorative Service Providers"

## 2017-12-21 DIAGNOSIS — R293 Abnormal posture: Secondary | ICD-10-CM

## 2017-12-21 DIAGNOSIS — R531 Weakness: Secondary | ICD-10-CM | POA: Diagnosis not present

## 2017-12-21 DIAGNOSIS — M25512 Pain in left shoulder: Secondary | ICD-10-CM

## 2017-12-21 DIAGNOSIS — G8929 Other chronic pain: Secondary | ICD-10-CM

## 2017-12-21 DIAGNOSIS — M545 Low back pain: Secondary | ICD-10-CM

## 2017-12-21 NOTE — Therapy (Signed)
[place], [place], [address] Phone: [phone]   Fax:  [phone]  Physical Therapy Treatment  Patient Details  Name: Kristina Martinez MRN: [id number] Date of Birth: Jun 01, 1971 Referring Provider: Dr Madilyn Fireman   Encounter Date: 12/21/2017  PT End of Session - 12/21/17 1021    Visit Number  2    Number of Visits  12    Date for PT Re-Evaluation  01/30/18    PT Start Time  1018    PT Stop Time  1110    PT Time Calculation (min)  52 min    Activity Tolerance  Patient tolerated treatment well       Past Medical History:  Diagnosis Date  . Achilles tendon injury   . ADD (attention deficit disorder)   . Depression   . IBS (irritable bowel syndrome)   . Migraine   . Obese   . Ovarian cyst   . PMDD (premenstrual dysphoric disorder)   . Vaso-vagal reaction     Past Surgical History:  Procedure Laterality Date  . LAPAROSCOPIC CHOLECYSTECTOMY  2011    There were no vitals filed for this visit.  Subjective Assessment - 12/21/17 1021    Subjective  Patient reports increased back pain following initial visit. Patient has not been able to do exercises since initial visit. Incresaed soreness and pain.     Currently in Pain?  Yes    Pain Score  0-No pain    Pain Location  Back    Pain Orientation  Right;Left;Lower    Pain Type  Chronic pain                       [facility] Adult PT Treatment/Exercise - 12/21/17 0001      Lumbar Exercises: Stretches   Passive Hamstring Stretch  Right;Left;2 reps;30 seconds supine with strap     Hip Flexor Stretch Limitations  hip flexor stretch in sitting 30 sec x 2 each side     Piriformis Stretch  Right;Left;2 reps;30 seconds supine travell       Lumbar Exercises: Aerobic   Recumbent Bike  L5 x 5 min       Lumbar Exercises: Supine   Bent Knee Raise  10 reps core engaged    Dead Bug  5 reps core engaged     Other Supine Lumbar Exercises  3  part core 10 sec x 10 difficulty with multifidi contraction     Other Supine Lumbar Exercises  shoulder flexion x 10 core engaged       Cryotherapy   Number Minutes Cryotherapy  12 Minutes pt stopped treatment at 12-13 min-needed to change positions    Cryotherapy Location  Lumbar Spine;Hip bilat     Type of Cryotherapy  Ice pack      Electrical Stimulation   Electrical Stimulation Location  bilat SI; bialt lower lumbar    Electrical Stimulation Action  IFC    Electrical Stimulation Parameters  to tolerance    Electrical Stimulation Goals  Pain;Tone      Manual Therapy   Manual therapy comments  pt prone     Soft tissue mobilization  gentle soft tissue mobilization bilat lumbar to posterior hips Rt more painful/tender than Lt     Myofascial Release  lumbar/posterior hips              PT Education - 12/21/17 1035    Education Details  HEP  Person(s) Educated  Patient    Methods  Explanation;Demonstration;Tactile cues;Handout;Verbal cues    Comprehension  Verbalized understanding;Returned demonstration;Tactile cues required          PT Long Term Goals - 12/19/17 1514      PT LONG TERM GOAL #1   Title  Improve posture and alignment allowing patient to demonstrate upright posture and neutral position of spine in standing and sitting 01/30/18    Time  6    Period  Weeks    Status  New      PT LONG TERM GOAL #2   Title  Increase AROM lumbar spine to WNL's and without pain with ROM 01/30/18    Time  6    Period  Weeks    Status  New      PT LONG TERM GOAL #3   Title  Improve core strength allowing patient to perform normal functional activities without LBP 01/30/18    Time  6    Period  Weeks    Status  On-going      PT LONG TERM GOAL #4   Title  Independent in HEP 01/30/18    Time  6    Period  Weeks    Status  New      PT LONG TERM GOAL #5   Title  Improve FOTO to </= 30% limitation 01/30/18    Time  [place]    Status  New            Plan -  12/21/17 1117    Clinical Impression Statement  Kristina Martinez returns with report of increased LBP and bilat SI/hip area following initial evaluation and treatment. Reviewed HEP with modification of exercises and elimination of some of exercises. Added some lower level core stabilization in supine. Added gentle manual work with pt in prone. Trial of estim and ice pack with pt in prone. Patient tolerated prone for estim for ~ 12-13 min and felt she needed to move to change positions. Encouraged patient to try gentle exercises; avoid sitting with legs crossed in any position; discussed trial of spanx or tight support for hips/LB during the day to calm symptoms.     Rehab Potential  Good    PT Frequency  2x / week    PT Duration  6 weeks    PT Treatment/Interventions  Patient/family education;ADLs/Self Care Home Management;Cryotherapy;Electrical Stimulation;Iontophoresis 4mg /ml Dexamethasone;Moist Heat;Ultrasound;Dry needling;Manual techniques;Neuromuscular re-education;Therapeutic activities;Therapeutic exercise    PT Next Visit Plan  review HEP; progress core stabilization; manual work lumbar and Rt posterior hip as tolerated; modalities as indicated; consider ionto Rt SI area     Consulted and Agree with Plan of Care  Patient       Patient will benefit from skilled therapeutic intervention in order to improve the following deficits and impairments:  Postural dysfunction, Improper body mechanics, Pain, Increased fascial restricitons, Increased muscle spasms, Hypomobility, Decreased mobility, Decreased range of motion, Decreased activity tolerance  Visit Diagnosis: Chronic bilateral low back pain without sciatica  Abnormal posture  Weakness generalized  Acute pain of left shoulder     Problem List Patient Active Problem List   Diagnosis Date Noted  . Dysfunction of left rotator cuff 09/29/2016  . Pulmonary nodule, right 06/02/2016  . Needs flu shot 02/26/2016  . Primary osteoarthritis of left  knee 01/09/2015  . Rosacea 11/01/2013  . Neuropathy of right ulnar nerve at wrist 06/05/2013  . HLD (hyperlipidemia) 04/16/2013  . Plantar fasciitis,  bilateral 12/08/2012  . Anxiety 03/10/2012  . Major depressive disorder, recurrent episode, moderate ([place]) 07/14/2011  . Cervical radiculopathy 01/23/2011  . CARDIAC MURMUR 07/01/2010  . NONSPECIFIC MESENTERIC LYMPHADENITIS 01/29/2010  . NIGHT SWEATS 01/28/2010  . BACK PAIN 01/12/2010  . HEADACHE 09/16/2009  . [place] DISEASE, LUMBAR 06/24/2009  . SINUSITIS, CHRONIC 06/04/2009  . ALKALINE PHOSPHATASE, ELEVATED 03/19/2009  . Diaphragmatic hernia 12/26/2008  . Other and unspecified ovarian cyst 12/17/2008  . Attention deficit disorder 10/31/2007    Grainger Mccarley Nilda Simmer PT, MPH  12/21/2017, 11:23 AM  [facility] [place], [place], [address] Phone: [phone]   Fax:  [phone]  Name: Kristina Martinez MRN: [id number] Date of Birth: [birth year]

## 2017-12-21 NOTE — Patient Instructions (Addendum)
Extremity Flexion (Hook-Lying)    Tighten core and slowly lower right arm over head until back begins to arch. Keep core tight Repeat __10__ times per set. Do __1-2__ sets per session. Do __1__ sessions per day.   Bent Leg Lift (Hook-Lying)    Tighten core and slowly raise right leg __8-10__ inches from floor. Keep core tight. Hold ___1-2_ seconds. lower slowly, repeat with opposite leg  Repeat __10_ times per set. Do ____ sets per session. Do ____ sessions per day.   Combination (Hook-Lying)    Tighten core and slowly raise left leg and right arm over head. Keep core tight.  Repeat with right leg and left arm.  Repeat _10___ times per set. Do __1-2__ sets per session. Do __1__ sessions per day.

## 2017-12-26 DIAGNOSIS — R110 Nausea: Secondary | ICD-10-CM | POA: Diagnosis not present

## 2017-12-26 DIAGNOSIS — K219 Gastro-esophageal reflux disease without esophagitis: Secondary | ICD-10-CM | POA: Diagnosis not present

## 2017-12-26 DIAGNOSIS — K580 Irritable bowel syndrome with diarrhea: Secondary | ICD-10-CM | POA: Diagnosis not present

## 2017-12-27 ENCOUNTER — Encounter: Payer: Self-pay | Admitting: Physical Therapy

## 2017-12-27 ENCOUNTER — Ambulatory Visit (INDEPENDENT_AMBULATORY_CARE_PROVIDER_SITE_OTHER): Payer: 59 | Admitting: Physical Therapy

## 2017-12-27 DIAGNOSIS — M545 Low back pain: Secondary | ICD-10-CM

## 2017-12-27 DIAGNOSIS — G8929 Other chronic pain: Secondary | ICD-10-CM

## 2017-12-27 DIAGNOSIS — R293 Abnormal posture: Secondary | ICD-10-CM | POA: Diagnosis not present

## 2017-12-27 NOTE — Therapy (Signed)
[place], [place], [address] Phone: [phone]   Fax:  [phone]  Physical Therapy Treatment  Patient Details  Name: Kristina Martinez MRN: [id number] Date of Birth: [birth year] Referring Provider: Dr Madilyn Fireman   Encounter Date: 12/27/2017  PT End of Session - 12/27/17 0903    Visit Number  3    Number of Visits  12    Date for PT Re-Evaluation  01/30/18    PT Start Time  0805    PT Stop Time  0905    PT Time Calculation (min)  60 min    Activity Tolerance  Patient tolerated treatment well       Past Medical History:  Diagnosis Date  . Achilles tendon injury   . ADD (attention deficit disorder)   . Depression   . IBS (irritable bowel syndrome)   . Migraine   . Obese   . Ovarian cyst   . PMDD (premenstrual dysphoric disorder)   . Vaso-vagal reaction     Past Surgical History:  Procedure Laterality Date  . LAPAROSCOPIC CHOLECYSTECTOMY  2011    There were no vitals filed for this visit.  Subjective Assessment - 12/27/17 0812    Subjective  Pt reports 1/10 LBP bilat and that her thoat is in more pain than anything.     Pertinent History  shoulder pain; gall bladder surgery, depression     Currently in Pain?  Yes    Pain Score  1     Pain Location  Back    Pain Orientation  Right;Left;Lower    Pain Descriptors / Indicators  Aching    Pain Type  Chronic pain    Pain Onset  More than a month ago    Pain Frequency  Intermittent                       [facility] Adult PT Treatment/Exercise - 12/27/17 0001      Lumbar Exercises: Stretches   Passive Hamstring Stretch  Right;Left;2 reps;30 seconds    Lower Trunk Rotation  10 seconds;5 reps    Hip Flexor Stretch Limitations  hip flexor stretch in sitting 30 sec x 2 each side     Piriformis Stretch  Right;Left;2 reps;30 seconds supine travell       Lumbar Exercises: Aerobic   Recumbent Bike  L5 x 5 min       Lumbar  Exercises: Supine   Clam  20 reps red Tband with core    Dead Bug  10 reps    Bridge  10 reps;5 seconds    Other Supine Lumbar Exercises  3 part core 10 sec x 10 difficulty with multifidi contraction       Cryotherapy   Number Minutes Cryotherapy  15 Minutes    Cryotherapy Location  Lumbar Spine;Hip    Type of Cryotherapy  Ice pack      Electrical Stimulation   Electrical Stimulation Location  bilat SI; bialt lower lumbar    Electrical Stimulation Action  pre mod    Electrical Stimulation Parameters  tolerance    Electrical Stimulation Goals  Pain      Manual Therapy   Manual therapy comments  supine for LE distraction/traction, prone for STM    Soft tissue mobilization  gentle soft tissue mobilization bilat lumbar to posterior hips Rt more painful/tender than Lt     Myofascial Release  lumbar/posterior hips  PT Education - 12/27/17 0902    Education Details  technique for new exercises     Person(s) Educated  Patient    Methods  Explanation;Demonstration;Verbal cues    Comprehension  Verbalized understanding;Returned demonstration          PT Long Term Goals - 12/27/17 0907      PT LONG TERM GOAL #1   Title  Improve posture and alignment allowing patient to demonstrate upright posture and neutral position of spine in standing and sitting 01/30/18    Time  6    Period  Weeks    Status  On-going      PT LONG TERM GOAL #2   Title  Increase AROM lumbar spine to WNL's and without pain with ROM 01/30/18    Time  6    Period  Weeks    Status  On-going      PT LONG TERM GOAL #3   Title  Improve core strength allowing patient to perform normal functional activities without LBP 01/30/18    Time  6    Period  Weeks    Status  On-going      PT LONG TERM GOAL #4   Title  Independent in HEP 01/30/18    Time  6    Period  Weeks    Status  On-going      PT LONG TERM GOAL #5   Title  Improve FOTO to </= 30% limitation 01/30/18    Time  6    Period  Weeks     Status  On-going            Plan - 12/27/17 0903    Clinical Impression Statement  Pt had overall decreased LBP/SI pain today and was able to progress her therex/core stabilizaiton adding in clams and bridges with core activation. She had good return demonstration of new exercises. She was again treated with Cold and Estim to decrease pain and inflammation but this sesison was in sitting position as she gets uncomfortable laying too long in one position. MT continued with positive return, continue poc.     Rehab Potential  Good    PT Frequency  2x / week    PT Duration  6 weeks    PT Treatment/Interventions  Patient/family education;ADLs/Self Care Home Management;Cryotherapy;Electrical Stimulation;Iontophoresis 4mg /ml Dexamethasone;Moist Heat;Ultrasound;Dry needling;Manual techniques;Neuromuscular re-education;Therapeutic activities;Therapeutic exercise    PT Next Visit Plan  Continue POC with focus on core, and lumbar strength and stability and stretching, modalites PRN.    Consulted and Agree with Plan of Care  Patient       Patient will benefit from skilled therapeutic intervention in order to improve the following deficits and impairments:  Postural dysfunction, Improper body mechanics, Pain, Increased fascial restricitons, Increased muscle spasms, Hypomobility, Decreased mobility, Decreased range of motion, Decreased activity tolerance  Visit Diagnosis: Chronic bilateral low back pain without sciatica  Abnormal posture     Problem List Patient Active Problem List   Diagnosis Date Noted  . Dysfunction of left rotator cuff 09/29/2016  . Pulmonary nodule, right 06/02/2016  . Needs flu shot 02/26/2016  . Primary osteoarthritis of left knee 01/09/2015  . Rosacea 11/01/2013  . Neuropathy of right ulnar nerve at wrist 06/05/2013  . HLD (hyperlipidemia) 04/16/2013  . Plantar fasciitis, bilateral 12/08/2012  . Anxiety 03/10/2012  . Major depressive disorder, recurrent episode,  moderate ([place]) 07/14/2011  . Cervical radiculopathy 01/23/2011  . CARDIAC MURMUR 07/01/2010  . NONSPECIFIC MESENTERIC LYMPHADENITIS 01/29/2010  .  NIGHT SWEATS 01/28/2010  . BACK PAIN 01/12/2010  . HEADACHE 09/16/2009  . [place] DISEASE, LUMBAR 06/24/2009  . SINUSITIS, CHRONIC 06/04/2009  . ALKALINE PHOSPHATASE, ELEVATED 03/19/2009  . Diaphragmatic hernia 12/26/2008  . Other and unspecified ovarian cyst 12/17/2008  . Attention deficit disorder 10/31/2007    Debbe Odea, PT, DPT 12/27/2017, 9:10 AM  [facility] [place], [place], [address] Phone: [phone]   Fax:  [phone]  Name: ZULLY FRANE MRN: [id number] Date of Birth: 05/18/1971

## 2018-01-02 ENCOUNTER — Ambulatory Visit: Payer: 59 | Admitting: Physical Therapy

## 2018-01-02 ENCOUNTER — Encounter: Payer: Self-pay | Admitting: Physical Therapy

## 2018-01-02 DIAGNOSIS — G8929 Other chronic pain: Secondary | ICD-10-CM | POA: Diagnosis not present

## 2018-01-02 DIAGNOSIS — M545 Low back pain, unspecified: Secondary | ICD-10-CM

## 2018-01-02 DIAGNOSIS — R293 Abnormal posture: Secondary | ICD-10-CM | POA: Diagnosis not present

## 2018-01-02 NOTE — Therapy (Signed)
[place], [place], [address] Phone: [phone]   Fax:  [phone]  Physical Therapy Treatment  Patient Details  Name: Kristina Martinez MRN: [id number] Date of Birth: 1970-09-03 Referring Provider: Dr Madilyn Fireman   Encounter Date: 01/02/2018  PT End of Session - 01/02/18 0952    Visit Number  4    Number of Visits  12    Date for PT Re-Evaluation  01/30/18    PT Start Time  0850    PT Stop Time  0950    PT Time Calculation (min)  60 min    Activity Tolerance  Patient tolerated treatment well    Behavior During Therapy  [facility] for tasks assessed/performed       Past Medical History:  Diagnosis Date  . Achilles tendon injury   . ADD (attention deficit disorder)   . Depression   . IBS (irritable bowel syndrome)   . Migraine   . Obese   . Ovarian cyst   . PMDD (premenstrual dysphoric disorder)   . Vaso-vagal reaction     Past Surgical History:  Procedure Laterality Date  . LAPAROSCOPIC CHOLECYSTECTOMY  2011    There were no vitals filed for this visit.  Subjective Assessment - 01/02/18 0855    Subjective  Pt relays she had to move alot of wedding gowns at work so her back is more sore today on Rt vs Lt.    Pertinent History  shoulder pain; gall bladder surgery, depression     How long can you sit comfortably?  1 hour     How long can you stand comfortably?  20-30 min     How long can you walk comfortably?  20-30 min     Diagnostic tests  xrays     Patient Stated Goals  get rid of the back pain     Currently in Pain?  Yes    Pain Score  4     Pain Location  Back    Pain Orientation  Right;Left    Pain Descriptors / Indicators  Aching    Pain Type  Chronic pain    Pain Onset  More than a month ago    Pain Frequency  Constant    Aggravating Factors   working    Pain Relieving Factors  sleeping         [facility] PT Assessment - 01/02/18 0001      AROM   Lumbar Flexion  100%    Lumbar  Extension  100%    Lumbar - Right Side Bend  100%    Lumbar - Left Side Bend  85% with pain/pulling on Rt side    Lumbar - Right Rotation  WNL's    Lumbar - Left Rotation  WNL's       Strength   Overall Strength Comments  5/5 bilat LE's      Flexibility   Hamstrings  90 deg bilat      Palpation   Spinal mobility  hypomobility and pain with CPA and lateral mobs L3/4/5 to sacrum     Palpation comment  tender to palpation through bilat lumbar paraspinals; Rt sacral border; piriformis; gluts                    [facility] Adult PT Treatment/Exercise - 01/02/18 0001      Lumbar Exercises: Stretches   Passive Hamstring Stretch  Right;Left;2 reps;30 seconds    Lower Trunk Rotation  10 seconds;5 reps some pain stretching to Lt     Hip Flexor Stretch Limitations  hip flexor stretch in sitting 30 sec x 2 each side     Prone on Elbows Stretch  2 reps;60 seconds    Piriformis Stretch  Right;Left;2 reps;30 seconds      Lumbar Exercises: Aerobic   Recumbent Bike  L5 x 5 min       Lumbar Exercises: Supine   Clam  20 reps Green    Dead Bug  10 reps    Bridge  15 reps;5 seconds    Other Supine Lumbar Exercises  3 part core 10 sec x 10 difficulty with multifidi contraction       Cryotherapy   Number Minutes Cryotherapy  15 Minutes    Cryotherapy Location  Lumbar Spine;Hip    Type of Cryotherapy  Ice pack      Electrical Stimulation   Electrical Stimulation Location  bilat SI; bialt lower lumbar    Electrical Stimulation Action  IFC    Electrical Stimulation Parameters  tolerance    Electrical Stimulation Goals  Pain      Manual Therapy   Manual Therapy  Joint mobilization    Manual therapy comments  supine for LE distraction/traction, prone for STM    Joint Mobilization  -- gentle grade 1 SI jt mobs PA bilat and to lower lumbar/sacra    Soft tissue mobilization  gentle soft tissue mobilization bilat lumbar to posterior hips Rt more painful/tender than Lt     Myofascial Release   lumbar/posterior hips                   PT Long Term Goals - 01/02/18 0956      PT LONG TERM GOAL #1   Title  Improve posture and alignment allowing patient to demonstrate upright posture and neutral position of spine in standing and sitting 01/30/18    Time  6    Period  Weeks    Status  On-going      PT LONG TERM GOAL #2   Title  Increase AROM lumbar spine to WNL's and without pain with ROM 01/30/18    Time  6    Period  Weeks    Status  Partially Met      PT LONG TERM GOAL #3   Title  Improve core strength allowing patient to perform normal functional activities without LBP 01/30/18    Time  6    Period  Weeks    Status  On-going      PT LONG TERM GOAL #4   Title  Independent in HEP 01/30/18    Time  6    Period  Weeks    Status  On-going      PT LONG TERM GOAL #5   Title  Improve FOTO to </= 30% limitation 01/30/18    Time  6    Period  Weeks    Status  On-going            Plan - 01/02/18 0953    Clinical Impression Statement  Pt had more R sided SI jt/lower lumbar pain 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 She was treated with Manual therapy, and modalities to decrease this pain and inflammaiton. She was ablle to perform her lumbar stretching program and core strength with good tolerance and without compliaints expcept for Rt sided lumbar pain with low trunk rotation to left. Her lumbar ROM was  re-assessed and this has improved however except for side bending to left. Pt appears to be making steady progress, continue poc.     Rehab Potential  Good    PT Frequency  2x / week    PT Duration  6 weeks    PT Treatment/Interventions  Patient/family education;ADLs/Self Care Home Management;Cryotherapy;Electrical Stimulation;Iontophoresis 15m/ml Dexamethasone;Moist Heat;Ultrasound;Dry needling;Manual techniques;Neuromuscular re-education;Therapeutic activities;Therapeutic exercise    PT Next Visit Plan  Continue POC with focus on core, and  lumbar strength and stability and stretching, modalites PRN.    Consulted and Agree with Plan of Care  Patient       Patient will benefit from skilled therapeutic intervention in order to improve the following deficits and impairments:  Postural dysfunction, Improper body mechanics, Pain, Increased fascial restricitons, Increased muscle spasms, Hypomobility, Decreased mobility, Decreased range of motion, Decreased activity tolerance  Visit Diagnosis: Chronic bilateral low back pain without sciatica  Abnormal posture     Problem List Patient Active Problem List   Diagnosis Date Noted  . Dysfunction of left rotator cuff 09/29/2016  . Pulmonary nodule, right 06/02/2016  . Needs flu shot 02/26/2016  . Primary osteoarthritis of left knee 01/09/2015  . Rosacea 11/01/2013  . Neuropathy of right ulnar nerve at wrist 06/05/2013  . HLD (hyperlipidemia) 04/16/2013  . Plantar fasciitis, bilateral 12/08/2012  . Anxiety 03/10/2012  . Major depressive disorder, recurrent episode, moderate ([place] 07/14/2011  . Cervical radiculopathy 01/23/2011  . CARDIAC MURMUR 07/01/2010  . NONSPECIFIC MESENTERIC LYMPHADENITIS 01/29/2010  . NIGHT SWEATS 01/28/2010  . BACK PAIN 01/12/2010  . HEADACHE 09/16/2009  . DLake Almanor WestDISEASE, LUMBAR 06/24/2009  . SINUSITIS, CHRONIC 06/04/2009  . ALKALINE PHOSPHATASE, ELEVATED 03/19/2009  . Diaphragmatic hernia 12/26/2008  . Other and unspecified ovarian cyst 12/17/2008  . Attention deficit disorder 10/31/2007    BDebbe Odea PT, DPT 01/02/2018, 9:58 AM  [facility] [address]Phone: [phone]  Fax:  [phone] Name: Kristina HUERTASMRN: [id number] of Birth: [birth year]

## 2018-01-04 ENCOUNTER — Encounter: Payer: Self-pay | Admitting: Physical Therapy

## 2018-01-04 ENCOUNTER — Ambulatory Visit: Payer: 59 | Admitting: Physical Therapy

## 2018-01-04 DIAGNOSIS — M545 Low back pain: Secondary | ICD-10-CM

## 2018-01-04 DIAGNOSIS — G8929 Other chronic pain: Secondary | ICD-10-CM

## 2018-01-04 NOTE — Therapy (Signed)
[place], [place], [address] Phone: [phone]   Fax:  [phone]  Physical Therapy Treatment  Patient Details  Name: Kristina Martinez MRN: [id number] Date of Birth: 05-01-1971 Referring Provider: Dr Madilyn Fireman   Encounter Date: 01/04/2018  PT End of Session - 01/04/18 0949    Visit Number  5    Number of Visits  12    Date for PT Re-Evaluation  01/30/18    PT Start Time  0855    PT Stop Time  0945    PT Time Calculation (min)  50 min    Activity Tolerance  Treatment limited secondary to medical complications (Comment)    Behavior During Therapy  [facility] for tasks assessed/performed       Past Medical History:  Diagnosis Date  . Achilles tendon injury   . ADD (attention deficit disorder)   . Depression   . IBS (irritable bowel syndrome)   . Migraine   . Obese   . Ovarian cyst   . PMDD (premenstrual dysphoric disorder)   . Vaso-vagal reaction     Past Surgical History:  Procedure Laterality Date  . LAPAROSCOPIC CHOLECYSTECTOMY  2011    There were no vitals filed for this visit.  Subjective Assessment - 01/04/18 0857    Subjective  Pt relays her back is doing better today, and "I think we are getting there" in regards to her back improving.    Pertinent History  shoulder pain; gall bladder surgery, depression     Currently in Pain?  Yes    Pain Score  2     Pain Location  Back                       [facility] Adult PT Treatment/Exercise - 01/04/18 0001      Lumbar Exercises: Stretches   Passive Hamstring Stretch  Right;Left;2 reps;30 seconds    Lower Trunk Rotation  -- D/C    Prone on Elbows Stretch  2 reps;60 seconds    Piriformis Stretch  Right;Left;2 reps;30 seconds      Lumbar Exercises: Aerobic   Recumbent Bike  L5 x 5 min       Lumbar Exercises: Standing   Row  Both;20 reps;Theraband green    Shoulder Extension  Both;20 reps;Theraband Green      Lumbar  Exercises: Seated   Other Seated Lumbar Exercises  sitting chops with core engaged, 10 reps bilat small green ball      Lumbar Exercises: Supine   Clam  20 reps Green    Dead Bug  10 reps    Bridge  15 reps;5 seconds      Manual Therapy   Manual Therapy  Joint mobilization;Passive ROM    Manual therapy comments  prone    Joint Mobilization  gentle Gr 1 SI/Lumbar/Sacral mobs PA    Soft tissue mobilization  gentle soft tissue mobilization bilat lumbar to posterior hips Rt more painful/tender than Lt     Myofascial Release  lumbar/posterior hips     Passive ROM  PROM for Hip IR/ER and quads in prone to decrease tightness             PT Education - 01/04/18 0948    Education Details  technique for new exercises    Person(s) Educated  Patient    Methods  Explanation;Demonstration;Verbal cues    Comprehension  Verbalized understanding;Returned demonstration  PT Long Term Goals - 01/02/18 0956      PT LONG TERM GOAL #1   Title  Improve posture and alignment allowing patient to demonstrate upright posture and neutral position of spine in standing and sitting 01/30/18    Time  6    Period  Weeks    Status  On-going      PT LONG TERM GOAL #2   Title  Increase AROM lumbar spine to WNL's and without pain with ROM 01/30/18    Time  6    Period  Weeks    Status  Partially Met      PT LONG TERM GOAL #3   Title  Improve core strength allowing patient to perform normal functional activities without LBP 01/30/18    Time  6    Period  Weeks    Status  On-going      PT LONG TERM GOAL #4   Title  Independent in HEP 01/30/18    Time  6    Period  Weeks    Status  On-going      PT LONG TERM GOAL #5   Title  Improve FOTO to </= 30% limitation 01/30/18    Time  6    Period  Weeks    Status  On-going            Plan - 01/04/18 0949    Clinical Impression Statement  Pt had less pain today and was able to progress core strengthening today with good tolerance adding in  rows/ext and sitting chops all with core engament. She was again treated wiith manual therapy but did not have as large of a knot around her R SI jt. She declined ice and TENS post session due to low pain levels. Continue POC    Rehab Potential  Good    PT Frequency  2x / week    PT Duration  6 weeks    PT Treatment/Interventions  Patient/family education;ADLs/Self Care Home Management;Cryotherapy;Electrical Stimulation;Iontophoresis 74m/ml Dexamethasone;Moist Heat;Ultrasound;Dry needling;Manual techniques;Neuromuscular re-education;Therapeutic activities;Therapeutic exercise    PT Next Visit Plan  Continue POC with focus on core, and lumbar strength and stability and stretching, modalites PRN.    Consulted and Agree with Plan of Care  Patient       Patient will benefit from skilled therapeutic intervention in order to improve the following deficits and impairments:  Postural dysfunction, Improper body mechanics, Pain, Increased fascial restricitons, Increased muscle spasms, Hypomobility, Decreased mobility, Decreased range of motion, Decreased activity tolerance  Visit Diagnosis: Chronic bilateral low back pain without sciatica     Problem List Patient Active Problem List   Diagnosis Date Noted  . Dysfunction of left rotator cuff 09/29/2016  . Pulmonary nodule, right 06/02/2016  . Needs flu shot 02/26/2016  . Primary osteoarthritis of left knee 01/09/2015  . Rosacea 11/01/2013  . Neuropathy of right ulnar nerve at wrist 06/05/2013  . HLD (hyperlipidemia) 04/16/2013  . Plantar fasciitis, bilateral 12/08/2012  . Anxiety 03/10/2012  . Major depressive disorder, recurrent episode, moderate (HSunset [place] 07/14/2011  . Cervical radiculopathy 01/23/2011  . CARDIAC MURMUR 07/01/2010  . NONSPECIFIC MESENTERIC LYMPHADENITIS 01/29/2010  . NIGHT SWEATS 01/28/2010  . BACK PAIN 01/12/2010  . HEADACHE 09/16/2009  . DMinnewaukanDISEASE, LUMBAR 06/24/2009  . SINUSITIS, CHRONIC 06/04/2009  . ALKALINE  PHOSPHATASE, ELEVATED 03/19/2009  . Diaphragmatic hernia 12/26/2008  . Other and unspecified ovarian cyst 12/17/2008  . Attention deficit disorder 10/31/2007    BDebbe Odea PT, DPT 01/04/2018,  9:54 AM  [facility] [place], [place], [address] Phone: [phone]   Fax:  [phone]  Name: Kristina Martinez MRN: [id number] Date of Birth: [birth year]

## 2018-01-09 ENCOUNTER — Encounter: Payer: Self-pay | Admitting: Physical Therapy

## 2018-01-09 ENCOUNTER — Ambulatory Visit: Payer: 59 | Admitting: Physical Therapy

## 2018-01-09 DIAGNOSIS — R7989 Other specified abnormal findings of blood chemistry: Secondary | ICD-10-CM | POA: Diagnosis not present

## 2018-01-09 DIAGNOSIS — M545 Low back pain, unspecified: Secondary | ICD-10-CM

## 2018-01-09 DIAGNOSIS — G8929 Other chronic pain: Secondary | ICD-10-CM

## 2018-01-09 DIAGNOSIS — E6609 Other obesity due to excess calories: Secondary | ICD-10-CM | POA: Diagnosis not present

## 2018-01-09 DIAGNOSIS — D500 Iron deficiency anemia secondary to blood loss (chronic): Secondary | ICD-10-CM | POA: Diagnosis not present

## 2018-01-09 DIAGNOSIS — D721 Eosinophilia: Secondary | ICD-10-CM | POA: Diagnosis not present

## 2018-01-09 NOTE — Therapy (Signed)
[place], [place], [address] Phone: [phone]   Fax:  [phone]  Physical Therapy Treatment  Patient Details  Name: Kristina Martinez MRN: [id number] Date of Birth: 07/20/1970 Referring Provider: Madilyn Fireman   Encounter Date: 01/09/2018  PT End of Session - 01/09/18 1526    Visit Number  6    Number of Visits  12    Date for PT Re-Evaluation  01/30/18    PT Start Time  1430    PT Stop Time  1530    PT Time Calculation (min)  60 min    Activity Tolerance  Patient tolerated treatment well    Behavior During Therapy  [facility] for tasks assessed/performed       Past Medical History:  Diagnosis Date  . Achilles tendon injury   . ADD (attention deficit disorder)   . Depression   . IBS (irritable bowel syndrome)   . Migraine   . Obese   . Ovarian cyst   . PMDD (premenstrual dysphoric disorder)   . Vaso-vagal reaction     Past Surgical History:  Procedure Laterality Date  . LAPAROSCOPIC CHOLECYSTECTOMY  2011    There were no vitals filed for this visit.  Subjective Assessment - 01/09/18 1436    Subjective  Pt relays her back feels "not too bad today"    Pertinent History  shoulder pain; gall bladder surgery, depression     How long can you sit comfortably?  1 hour     How long can you stand comfortably?  20-30 min     How long can you walk comfortably?  20-30 min     Patient Stated Goals  get rid of the back pain     Currently in Pain?  Yes    Pain Score  4     Pain Location  Back    Pain Orientation  Right;Left    Pain Descriptors / Indicators  Aching    Pain Type  Chronic pain    Pain Onset  More than a month ago    Pain Frequency  Constant    Aggravating Factors   working         [facility] PT Assessment - 01/09/18 0001      Assessment   Medical Diagnosis  LBP/SI dysfunction     Referring Provider  [place]    Onset Date/Surgical Date  05/28/17    Hand Dominance  Right    Next MD  Visit  PRN      Precautions   Precautions  None      Posture/Postural Control   Posture Comments  head forward; shoulders rounded and elevated       AROM   Lumbar Flexion  100%    Lumbar Extension  100%    Lumbar - Right Side Bend  100%    Lumbar - Left Side Bend  90%    Lumbar - Right Rotation  WNL's    Lumbar - Left Rotation  WNL's       Strength   Overall Strength Comments  5/5 bilat LE's      Flexibility   Hamstrings  90 deg bilat    Quadriceps  tight bilat     ITB  tight Rt > Lt    Piriformis  tight Rt > Lt       Palpation   Spinal mobility  hypomobility and pain with CPA and lateral mobs L3/4/5 to sacrum and SI jt  Rt>Lt    Palpation comment  tender to palpation through bilat lumbar paraspinals; Rt sacral border; piriformis; gluts                    [facility] Adult PT Treatment/Exercise - 01/09/18 1438      Lumbar Exercises: Stretches   Passive Hamstring Stretch  Right;Left;2 reps;30 seconds    Lower Trunk Rotation  -- D/C    Prone on Elbows Stretch  2 reps;60 seconds    Piriformis Stretch  Right;Left;2 reps;30 seconds      Lumbar Exercises: Aerobic   Recumbent Bike  L5 x 5 min       Lumbar Exercises: Standing   Row  Both;20 reps;Theraband green    Shoulder Extension  Both;20 reps;Theraband Green      Lumbar Exercises: Seated   Other Seated Lumbar Exercises  sitting chops with core engaged, 10 reps bilat small red ball      Lumbar Exercises: Supine   Clam  20 reps Green    Dead Bug  10 reps    Bridge  5 seconds;20 reps    Isometric Hip Flexion  10 reps;5 seconds      Cryotherapy   Number Minutes Cryotherapy  15 Minutes    Cryotherapy Location  Lumbar Spine    Type of Cryotherapy  Ice pack      Electrical Stimulation   Electrical Stimulation Location  bilat SI; bialt lower lumbar    Electrical Stimulation Action  IFC    Electrical Stimulation Parameters  to tolerance    Electrical Stimulation Goals  Pain      Manual Therapy   Manual  Therapy  Joint mobilization;Passive ROM    Manual therapy comments  prone    Joint Mobilization  gentle Gr 1 SI/Lumbar/Sacral mobs PA, lumbar/hip distraction    Soft tissue mobilization  gentle soft tissue mobilization bilat lumbar to posterior hips Rt more painful/tender than Lt     Myofascial Release  lumbar/posterior hips     Passive ROM  PROM for Hip IR/ER and quads in prone to decrease tightness                  PT Long Term Goals - 01/09/18 1533      PT LONG TERM GOAL #1   Title  Improve posture and alignment allowing patient to demonstrate upright posture and neutral position of spine in standing and sitting 01/30/18    Time  6    Period  Weeks    Status  On-going      PT LONG TERM GOAL #2   Title  Increase AROM lumbar spine to WNL's and without pain with ROM 01/30/18    Time  6    Period  Weeks    Status  Partially Met      PT LONG TERM GOAL #3   Title  Improve core strength allowing patient to perform normal functional activities without LBP 01/30/18    Time  6    Period  Weeks    Status  On-going      PT LONG TERM GOAL #4   Title  Independent in HEP 01/30/18    Time  6    Period  Weeks    Status  On-going      PT LONG TERM GOAL #5   Title  Improve FOTO to </= 30% limitation 01/30/18    Time  6    Period  Weeks    Status  On-going            Plan - 01/09/18 1527    Clinical Impression Statement  Pt able to progress some with her therex with focus on core strengthening. She continues to have tenderness and pain primarily over Rt SI jt and was again treated with manual therapy. CP and TENS applied post session to decrease pain and sorenss. PT will continue to progress as able.    Clinical Presentation  Stable    PT Frequency  2x / week    PT Duration  6 weeks    PT Treatment/Interventions  Patient/family education;ADLs/Self Care Home Management;Cryotherapy;Electrical Stimulation;Iontophoresis 32m/ml Dexamethasone;Moist Heat;Ultrasound;Dry needling;Manual  techniques;Neuromuscular re-education;Therapeutic activities;Therapeutic exercise    PT Next Visit Plan  Continue POC with focus on core, and lumbar strength and stability and stretching, manual therapy and modalites PRN.    Consulted and Agree with Plan of Care  Patient       Patient will benefit from skilled therapeutic intervention in order to improve the following deficits and impairments:  Postural dysfunction, Improper body mechanics, Pain, Increased fascial restricitons, Increased muscle spasms, Hypomobility, Decreased mobility, Decreased range of motion, Decreased activity tolerance  Visit Diagnosis: Chronic bilateral low back pain without sciatica     Problem List Patient Active Problem List   Diagnosis Date Noted  . Dysfunction of left rotator cuff 09/29/2016  . Pulmonary nodule, right 06/02/2016  . Needs flu shot 02/26/2016  . Primary osteoarthritis of left knee 01/09/2015  . Rosacea 11/01/2013  . Neuropathy of right ulnar nerve at wrist 06/05/2013  . HLD (hyperlipidemia) 04/16/2013  . Plantar fasciitis, bilateral 12/08/2012  . Anxiety 03/10/2012  . Major depressive disorder, recurrent episode, moderate (HBo[place] 07/14/2011  . Cervical radiculopathy 01/23/2011  . CARDIAC MURMUR 07/01/2010  . NONSPECIFIC MESENTERIC LYMPHADENITIS 01/29/2010  . NIGHT SWEATS 01/28/2010  . BACK PAIN 01/12/2010  . HEADACHE 09/16/2009  . DHuntingdonDISEASE, LUMBAR 06/24/2009  . SINUSITIS, CHRONIC 06/04/2009  . ALKALINE PHOSPHATASE, ELEVATED 03/19/2009  . Diaphragmatic hernia 12/26/2008  . Other and unspecified ovarian cyst 12/17/2008  . Attention deficit disorder 10/31/2007    BDebbe Odea PT, DPT 01/09/2018, 3:34 PM  [facility] [address]Phone: [phone]  Fax:  [phone] Name: Kristina ANDRINGAMRN: [id number] of Birth: [birth year]

## 2018-01-11 ENCOUNTER — Encounter: Payer: Self-pay | Admitting: Physical Therapy

## 2018-01-11 ENCOUNTER — Ambulatory Visit (INDEPENDENT_AMBULATORY_CARE_PROVIDER_SITE_OTHER): Payer: 59 | Admitting: Physical Therapy

## 2018-01-11 DIAGNOSIS — R293 Abnormal posture: Secondary | ICD-10-CM

## 2018-01-11 DIAGNOSIS — R531 Weakness: Secondary | ICD-10-CM | POA: Diagnosis not present

## 2018-01-11 DIAGNOSIS — M545 Low back pain: Secondary | ICD-10-CM

## 2018-01-11 DIAGNOSIS — G8929 Other chronic pain: Secondary | ICD-10-CM

## 2018-01-11 NOTE — Therapy (Addendum)
[place], [place], [address] Phone: [phone]   Fax:  [phone]  Physical Therapy Treatment/Discharge  Patient Details  Name: Kristina Martinez MRN: [id number] Date of Birth: February 18, 1971 Referring Provider: Madilyn Fireman   Encounter Date: 01/11/2018  PT End of Session - 01/11/18 1128    Visit Number  7    Number of Visits  12    Date for PT Re-Evaluation  01/30/18    PT Start Time  0930    PT Stop Time  1020    PT Time Calculation (min)  50 min    Activity Tolerance  Patient tolerated treatment well       Past Medical History:  Diagnosis Date  . Achilles tendon injury   . ADD (attention deficit disorder)   . Depression   . IBS (irritable bowel syndrome)   . Migraine   . Obese   . Ovarian cyst   . PMDD (premenstrual dysphoric disorder)   . Vaso-vagal reaction     Past Surgical History:  Procedure Laterality Date  . LAPAROSCOPIC CHOLECYSTECTOMY  2011    There were no vitals filed for this visit.  Subjective Assessment - 01/11/18 0933    Subjective  Pt relays 4/10 LBP today, "I dont know why, I did not do much work yesterday"    Pertinent History  shoulder pain; gall bladder surgery, depression     How long can you sit comfortably?  1 hour     How long can you stand comfortably?  20-30 min     How long can you walk comfortably?  20-30 min     Diagnostic tests  xrays     Patient Stated Goals  get rid of the back pain     Currently in Pain?  Yes    Pain Score  4     Pain Location  Back                       [facility] Adult PT Treatment/Exercise - 01/11/18 0935      Lumbar Exercises: Stretches   Passive Hamstring Stretch  Right;Left;2 reps;30 seconds    Lower Trunk Rotation  -- D/C    Prone on Elbows Stretch  2 reps;60 seconds    Piriformis Stretch  Right;Left;2 reps;30 seconds      Lumbar Exercises: Aerobic   Recumbent Bike  L5 x 5 min       Lumbar Exercises:  Standing   Row  Both;20 reps;Theraband green    Shoulder Extension  Both;20 reps;Theraband Green    Other Standing Lumbar Exercises  stand hip ext 15 reps bilat      Lumbar Exercises: Seated   Other Seated Lumbar Exercises  sitting chops with core engaged, 10 reps bilat small red ball      Lumbar Exercises: Supine   Clam  20 reps Green    Dead Bug  10 reps    Bridge  5 seconds;20 reps    Isometric Hip Flexion  10 reps;5 seconds      Modalities   Modalities  Ultrasound      Cryotherapy   Cryotherapy Location  --      Acupuncturist Location  --    Electrical Stimulation Goals  --      Ultrasound   Ultrasound Location  Rt SI/lumbar P.S 8 min, Lt P.S 5 min with biofreeze    Ultrasound Parameters  1.0wcm,  1.41mz, 100%    Ultrasound Goals  Pain      Manual Therapy   Manual Therapy  --    Manual therapy comments  --    Joint Mobilization  --    Soft tissue mobilization  --    Myofascial Release  --    Passive ROM  --             PT Education - 01/11/18 1128    Education Details  U.S edu    Person(s) Educated  Patient    Methods  Explanation    Comprehension  Verbalized understanding          PT Long Term Goals - 01/09/18 1533      PT LONG TERM GOAL #1   Title  Improve posture and alignment allowing patient to demonstrate upright posture and neutral position of spine in standing and sitting 01/30/18    Time  6    Period  Weeks    Status  On-going      PT LONG TERM GOAL #2   Title  Increase AROM lumbar spine to WNL's and without pain with ROM 01/30/18    Time  6    Period  Weeks    Status  Partially Met      PT LONG TERM GOAL #3   Title  Improve core strength allowing patient to perform normal functional activities without LBP 01/30/18    Time  6    Period  Weeks    Status  On-going      PT LONG TERM GOAL #4   Title  Independent in HEP 01/30/18    Time  6    Period  Weeks    Status  On-going      PT LONG TERM GOAL #5    Title  Improve FOTO to </= 30% limitation 01/30/18    Time  6    Period  Weeks    Status  On-going            Plan - 01/11/18 1129    Clinical Impression Statement  Pt has made great progress with lumbar ROM, fair progress with core strength, but she is still having moderate pain levels paticularly over Rt SI jt. She was trialed with U.S today over her tender painful spots in efforts to decrease pain and inflammaiton.     Rehab Potential  Good    PT Frequency  2x / week    PT Duration  6 weeks    PT Treatment/Interventions  Patient/family education;ADLs/Self Care Home Management;Cryotherapy;Electrical Stimulation;Iontophoresis 439mml Dexamethasone;Moist Heat;Ultrasound;Dry needling;Manual techniques;Neuromuscular re-education;Therapeutic activities;Therapeutic exercise    PT Next Visit Plan  assess her response to U.S next session. Continue POC with focus on core, and lumbar strength and stability and stretching, manual therapy and modalites PRN.    Consulted and Agree with Plan of Care  Patient       Patient will benefit from skilled therapeutic intervention in order to improve the following deficits and impairments:  Postural dysfunction, Improper body mechanics, Pain, Increased fascial restricitons, Increased muscle spasms, Hypomobility, Decreased mobility, Decreased range of motion, Decreased activity tolerance  Visit Diagnosis: Chronic bilateral low back pain without sciatica  Abnormal posture  Weakness generalized     Problem List Patient Active Problem List   Diagnosis Date Noted  . Dysfunction of left rotator cuff 09/29/2016  . Pulmonary nodule, right 06/02/2016  . Needs flu shot 02/26/2016  . Primary osteoarthritis of left knee 01/09/2015  .  Rosacea 11/01/2013  . Neuropathy of right ulnar nerve at wrist 06/05/2013  . HLD (hyperlipidemia) 04/16/2013  . Plantar fasciitis, bilateral 12/08/2012  . Anxiety 03/10/2012  . Major depressive disorder, recurrent episode,  moderate ([place]) 07/14/2011  . Cervical radiculopathy 01/23/2011  . CARDIAC MURMUR 07/01/2010  . NONSPECIFIC MESENTERIC LYMPHADENITIS 01/29/2010  . NIGHT SWEATS 01/28/2010  . BACK PAIN 01/12/2010  . HEADACHE 09/16/2009  . [place] DISEASE, LUMBAR 06/24/2009  . SINUSITIS, CHRONIC 06/04/2009  . ALKALINE PHOSPHATASE, ELEVATED 03/19/2009  . Diaphragmatic hernia 12/26/2008  . Other and unspecified ovarian cyst 12/17/2008  . Attention deficit disorder 10/31/2007    Debbe Odea, PT, DPT 01/11/2018, 11:34 AM  [facility] [place], [place], [address] Phone: [phone]   Fax:  [phone]  Name: Kristina Martinez MRN: [id number] Date of Birth: [birth year]  PHYSICAL THERAPY DISCHARGE SUMMARY  Visits from Start of Care: 7 Current functional level related to goals / functional outcomes: Unable to fully assess as she did not return to PT   Remaining deficits: See above note    Plan: Patient agrees to discharge.  Patient goals were partially met. Patient is being discharged due to not returning since the last visit.  ?????    Elsie Ra, PT, DPT 02/15/18 9:05 AM

## 2018-01-23 ENCOUNTER — Ambulatory Visit (INDEPENDENT_AMBULATORY_CARE_PROVIDER_SITE_OTHER): Payer: 59 | Admitting: Physician Assistant

## 2018-01-23 ENCOUNTER — Ambulatory Visit (INDEPENDENT_AMBULATORY_CARE_PROVIDER_SITE_OTHER): Payer: 59

## 2018-01-23 VITALS — BP 149/92 | HR 93 | Temp 98.9°F

## 2018-01-23 DIAGNOSIS — R82998 Other abnormal findings in urine: Secondary | ICD-10-CM

## 2018-01-23 DIAGNOSIS — R109 Unspecified abdominal pain: Secondary | ICD-10-CM

## 2018-01-23 DIAGNOSIS — Z87442 Personal history of urinary calculi: Secondary | ICD-10-CM | POA: Diagnosis not present

## 2018-01-23 DIAGNOSIS — R300 Dysuria: Secondary | ICD-10-CM

## 2018-01-23 DIAGNOSIS — R3129 Other microscopic hematuria: Secondary | ICD-10-CM | POA: Diagnosis not present

## 2018-01-23 DIAGNOSIS — K5790 Diverticulosis of intestine, part unspecified, without perforation or abscess without bleeding: Secondary | ICD-10-CM

## 2018-01-23 DIAGNOSIS — R350 Frequency of micturition: Secondary | ICD-10-CM | POA: Diagnosis not present

## 2018-01-23 LAB — POCT URINALYSIS DIPSTICK
Glucose, UA: NEGATIVE
KETONES UA: 15
Nitrite, UA: POSITIVE
PH UA: 6.5 (ref 5.0–8.0)
Protein, UA: POSITIVE — AB
Spec Grav, UA: 1.025 (ref 1.010–1.025)
UROBILINOGEN UA: 1 U/dL

## 2018-01-23 MED ORDER — CEFTRIAXONE SODIUM 1 G IJ SOLR
1.0000 g | Freq: Once | INTRAMUSCULAR | Status: AC
  Administered 2018-01-23: 1 g via INTRAMUSCULAR

## 2018-01-23 MED ORDER — NITROFURANTOIN MONOHYD MACRO 100 MG PO CAPS: 100.0000 mg | ORAL_CAPSULE | Freq: Two times a day (BID) | ORAL | 0 refills | Status: AC

## 2018-01-23 NOTE — Progress Notes (Signed)
HPI:                                                                Kristina Martinez is a 47 y.o. female who presents to [facility]: Primary Care [facility] today for right flank pain  Pleasant 47 yo F with PMH significant for nephrolithiasis and pyelonephritis reports sudden onset right flank pain associated with nausea, diaphoresis and lightheadedness last night. Dysuria, suprapubic pressure, and urinary frequency began this morning. Pain is colicky, radiates to the right abdomen.  Denies fever, change in bowel habits. LMP 1 week ago Last stone was 1.5 years ago.   Past Medical History:  Diagnosis Date  . Achilles tendon injury   . ADD (attention deficit disorder)   . Depression   . IBS (irritable bowel syndrome)   . Migraine   . Obese   . Ovarian cyst   . PMDD (premenstrual dysphoric disorder)   . Vaso-vagal reaction    Past Surgical History:  Procedure Laterality Date  . LAPAROSCOPIC CHOLECYSTECTOMY  2011   Social History   Tobacco Use  . Smoking status: Never Smoker  . Smokeless tobacco: Never Used  Substance Use Topics  . Alcohol use: Yes    Comment: 1/ in 6 months   family history includes Breast cancer in her maternal grandmother and paternal grandmother; Heart disease in her father; Heart failure in her father; Prostate cancer in her father; Skin cancer in her mother; Stroke (age of onset: 83) in her brother; Uterine cancer in her maternal grandmother.    ROS: negative except as noted in the HPI  Medications: Current Outpatient Medications  Medication Sig Dispense Refill  . ALPRAZolam (XANAX) 0.5 MG tablet TAKE 1/2 TO 1 TABLET BY MOUTH DAILY AS NEEDED 15 tablet 3  . citalopram (CELEXA) 20 MG tablet TAKE 1 TABLET(20 MG) BY MOUTH DAILY 90 tablet 1  . doxepin (SINEQUAN) 25 MG capsule Take by mouth.    . fluticasone (FLOVENT HFA) 110 MCG/ACT inhaler Inhale 2 puffs into the lungs 2 (two) times daily. For cough 1 Inhaler 0  .  levonorgestrel-ethinyl estradiol (SEASONALE,INTROVALE,JOLESSA) 0.15-0.03 MG tablet TAKE 1 TABLET BY MOUTH  DAILY 91 tablet 4  . methylphenidate (RITALIN) 10 MG tablet Take 1 tablet (10 mg total) by mouth 2 (two) times daily with breakfast and lunch. 60 tablet 0  . methylphenidate (RITALIN) 10 MG tablet Take 1 tablet (10 mg total) by mouth 2 (two) times daily. 60 tablet 0  . [START ON 02/12/2018] methylphenidate (RITALIN) 10 MG tablet Take 1 tablet (10 mg total) by mouth 2 (two) times daily. 60 tablet 0  . omeprazole (PRILOSEC) 40 MG capsule TAKE 1 CAPSULE(40 MG) BY MOUTH DAILY 90 capsule 1  . nitrofurantoin, macrocrystal-monohydrate, (MACROBID) 100 MG capsule Take 1 capsule (100 mg total) by mouth 2 (two) times daily for 5 days. 10 capsule 0   No current facility-administered medications for this visit.    Allergies  Allergen Reactions  . Ciprofloxacin Hives  . Zofran [Ondansetron Hcl]     Migraine        Objective:  BP (!) 149/92   Pulse 93   Temp 98.9 F (37.2 C) (Oral)   LMP 01/16/2018 (Approximate)  Gen:  alert, not ill-appearing, no distress,  appropriate for age, obese female HEENT: head normocephalic without obvious abnormality, conjunctiva and cornea clear, wearing glasses, trachea midline Pulm: Normal work of breathing, normal phonation GI: suprapubic tenderness, no CVA tenderness Neuro: alert and oriented x 3, no tremor MSK: extremities atraumatic, normal gait and station Skin: intact, no rashes on exposed skin, no jaundice, no cyanosis Psych: well-groomed, cooperative, good eye contact, euthymic mood, affect mood-congruent, speech is articulate, and thought processes clear and goal-directed    Results for orders placed or performed in visit on 01/23/18 (from the past 72 hour(s))  POCT Urinalysis Dipstick     Status: Abnormal   Collection Time: 01/23/18  2:30 PM  Result Value Ref Range   Color, UA dark yellow    Clarity, UA cloudy    Glucose, UA Negative Negative    Bilirubin, UA small    Ketones, UA 15    Spec Grav, UA 1.025 1.010 - 1.025   Blood, UA large    pH, UA 6.5 5.0 - 8.0   Protein, UA Positive (A) Negative   Urobilinogen, UA 1.0 0.2 or 1.0 E.U./dL   Nitrite, UA pos    Leukocytes, UA Small (1+) (A) Negative   Appearance     Odor     Ct Renal Stone Study  Result Date: 01/23/2018 CLINICAL DATA:  Flank pain EXAM: CT ABDOMEN AND PELVIS WITHOUT CONTRAST TECHNIQUE: Multidetector CT imaging of the abdomen and pelvis was performed following the standard protocol without IV contrast. COMPARISON:  04/10/2013 FINDINGS: Lower chest: No acute abnormality. Hepatobiliary: No focal liver abnormality is seen. Status post cholecystectomy. No biliary dilatation. Pancreas: Unremarkable. No pancreatic ductal dilatation or surrounding inflammatory changes. Spleen: Normal in size without focal abnormality. Adrenals/Urinary Tract: Adrenal glands are within normal limits. The kidneys are well visualized bilaterally. No renal calculi or obstructive changes are seen. Bladder is decompressed. Stomach/Bowel: Scattered diverticular change of the colon is noted without evidence of diverticulitis. No obstructive changes are seen. The appendix is within normal limits. Moderate-sized hiatal hernia is noted. Vascular/Lymphatic: Aortic atherosclerosis. No enlarged abdominal or pelvic lymph nodes. Reproductive: Uterus and bilateral adnexa are unremarkable. Other: Fat containing umbilical hernia is noted. No bowel is noted within. Musculoskeletal: No acute abnormality noted. Chronic degenerative anterolisthesis of L5 on S1 is noted. IMPRESSION: No evidence of renal calculi or urinary tract obstructive changes. Diverticulosis without diverticulitis. Electronically Signed   By: Inez Catalina M.D.   On: 01/23/2018 14:48      Assessment and Plan: 47 y.o. female with   Right flank pain  Urine frequency - Plan: POCT Urinalysis Dipstick, nitrofurantoin, macrocrystal-monohydrate, (MACROBID)  100 MG capsule, Urine Culture  Urine leukocytes - Plan: nitrofurantoin, macrocrystal-monohydrate, (MACROBID) 100 MG capsule, cefTRIAXone (ROCEPHIN) injection 1 g, Urine Culture  Microscopic hematuria - Plan: nitrofurantoin, macrocrystal-monohydrate, (MACROBID) 100 MG capsule, cefTRIAXone (ROCEPHIN) injection 1 g, Urine Culture  Dysuria - Plan: Urine Culture  Abdominal pain, unspecified abdominal location - Plan: CT RENAL STONE STUDY  History of nephrolithiasis - Plan: CT RENAL STONE STUDY, cefTRIAXone (ROCEPHIN) injection 1 g  - patient was initially scheduled for a nurse visit for dysuria. It was found on history that patient had symptoms beyond simple cystitis - afebrile, no tachycardia, mildly hypertensive, no CVA tenderness. UA grossly positive for large blood, small leuks, protein, ketones and bilirubin. Denies Pyridium/Azo use - in the setting of colicky R flank pain with nausea, diaphoresis and history of stones recommend CT stone study and start empiric treatment for complicated UTI with Rocephin and Macrobid.  Patient education and anticipatory guidance given Patient agrees with treatment plan Follow-up as needed if symptoms worsen or fail to improve  Darlyne Russian PA-C

## 2018-01-25 LAB — URINE CULTURE
MICRO NUMBER:: 90894842
SPECIMEN QUALITY:: ADEQUATE

## 2018-03-13 DIAGNOSIS — D500 Iron deficiency anemia secondary to blood loss (chronic): Secondary | ICD-10-CM | POA: Diagnosis not present

## 2018-03-23 ENCOUNTER — Encounter: Payer: Self-pay | Admitting: Family Medicine

## 2018-03-23 ENCOUNTER — Ambulatory Visit (INDEPENDENT_AMBULATORY_CARE_PROVIDER_SITE_OTHER): Payer: 59 | Admitting: Family Medicine

## 2018-03-23 VITALS — BP 140/88 | HR 87 | Ht 67.0 in | Wt 246.0 lb

## 2018-03-23 DIAGNOSIS — L918 Other hypertrophic disorders of the skin: Secondary | ICD-10-CM | POA: Diagnosis not present

## 2018-03-23 DIAGNOSIS — F988 Other specified behavioral and emotional disorders with onset usually occurring in childhood and adolescence: Secondary | ICD-10-CM

## 2018-03-23 DIAGNOSIS — F331 Major depressive disorder, recurrent, moderate: Secondary | ICD-10-CM | POA: Diagnosis not present

## 2018-03-23 MED ORDER — METHYLPHENIDATE HCL 10 MG PO TABS: 10.0000 mg | ORAL_TABLET | Freq: Two times a day (BID) | ORAL | 0 refills | Status: DC

## 2018-03-23 NOTE — Progress Notes (Signed)
   Subjective:    Patient ID: Kristina Martinez, female    DOB: [birth year], 47 y.o.   MRN: [id number]  HPI  ADD - Reports symptoms are well controlled on current regime. Denies any problems with insomnia, chest pain, palpitations, or SOB.     Also has 2 skin tags that she would like removed 1 of the right axilla and one underneath her right breast.  They get irritated with shaving and underneath her bra.  Also here for follow-up on her mood.  She has a history of depression and anxiety.  She is been on citalopram 20 mg daily and has been doing well up until about 3 weeks ago she started just to feel a little bit more apathetic.  She was just feeling a little overwhelmed about doing simple tasks such as folding socks and ending the dishwasher.  She says in the last week is actually gotten a little bit better.  She does have Xanax to take as needed.  She actually tried a friend's Lorazepam and felt like it was helpful.  Review of Systems     Objective:   Physical Exam  Constitutional: She is oriented to person, place, and time. She appears well-developed and well-nourished.  HENT:  Head: Normocephalic and atraumatic.  Cardiovascular: Normal rate, regular rhythm and normal heart sounds.  Pulmonary/Chest: Effort normal and breath sounds normal.  Neurological: She is alert and oriented to person, place, and time.  Skin: Skin is warm and dry.  Psychiatric: She has a normal mood and affect. Her behavior is normal.         Assessment & Plan:  ADD - Stable. Will need to monitor BP as it is borderline today.  RF sent to the pharmacy.    Skin tags-easily removed.  Patient tolerated really well.  Follow-up wound care discussed.  Call if any problems or concerns.  Depression/anxiety, recurrent-we discussed options as needed to continue with her current dose of citalopram but we are going to try switching her alprazolam to clonazepam instead and see if she feels like it is more effective.  I  discussed that I do not typically write Lorazepam unless someone is having a procedure or something like that done.  Skin Tag Removal Procedure Note  Pre-operative Diagnosis: Classic skin tags (acrochordon)  Post-operative Diagnosis: Classic skin tags (acrochordon)  Locations:right axilla and under right breast  Indications: irriation   Anesthesia: not required    Procedure Details  The risks (including bleeding and infection) and benefits of the procedure and Verbal informed consent obtained. Using sterile iris scissors, multiple skin tags were snipped off at their bases after cleansing with Betadine.  Bleeding was controlled by pressure.   Findings: Pathognomonic benign lesions  not sent for pathological exam.  Condition: Stable  Complications: none.  Plan: 1. Instructed to keep the wounds dry and covered for 24-48h and clean thereafter. 2. Warning signs of infection were reviewed.   3. Recommended that the patient use OTC acetaminophen as needed for pain.  4. Return as needed.

## 2018-03-23 NOTE — Patient Instructions (Signed)
Okay to wash the areas in the shower.  Just do not scrub at them.  They should form a small scab over the next day or 2.  You do not need to keep them covered just apply a small dab of Vaseline daily or twice a day for the next week.

## 2018-04-05 ENCOUNTER — Other Ambulatory Visit: Payer: Self-pay | Admitting: Family Medicine

## 2018-04-05 DIAGNOSIS — R1012 Left upper quadrant pain: Secondary | ICD-10-CM

## 2018-04-27 ENCOUNTER — Emergency Department (INDEPENDENT_AMBULATORY_CARE_PROVIDER_SITE_OTHER)
Admission: EM | Admit: 2018-04-27 | Discharge: 2018-04-27 | Disposition: A | Discharge status: Home / Self Care | Payer: 59 | Source: Home / Self Care | Attending: Family Medicine | Admitting: Family Medicine

## 2018-04-27 ENCOUNTER — Other Ambulatory Visit: Payer: Self-pay

## 2018-04-27 DIAGNOSIS — N3090 Cystitis, unspecified without hematuria: Secondary | ICD-10-CM

## 2018-04-27 DIAGNOSIS — R300 Dysuria: Secondary | ICD-10-CM | POA: Diagnosis not present

## 2018-04-27 LAB — POCT URINALYSIS DIP (MANUAL ENTRY)
Glucose, UA: NEGATIVE mg/dL
Nitrite, UA: NEGATIVE
Urobilinogen, UA: 1 E.U./dL
pH, UA: 5.5 (ref 5.0–8.0)

## 2018-04-27 MED ORDER — PHENAZOPYRIDINE HCL 200 MG PO TABS: 200.0000 mg | ORAL_TABLET | Freq: Three times a day (TID) | ORAL | 0 refills | Status: DC

## 2018-04-27 MED ORDER — NITROFURANTOIN MONOHYD MACRO 100 MG PO CAPS: 100.0000 mg | ORAL_CAPSULE | Freq: Two times a day (BID) | ORAL | 0 refills | Status: DC

## 2018-04-27 NOTE — ED Triage Notes (Signed)
Pt presents today with c/o dysuria and hematuria for a couple of days. Rates her pain 3/10. No other concerns at this time.

## 2018-04-27 NOTE — ED Provider Notes (Signed)
[facility]    CSN: [id number] Arrival date & time: 04/27/18  1527     History   Chief Complaint Chief Complaint  Patient presents with  . Hematuria  . Dysuria    HPI Kristina Martinez is a 47 y.o. female.   Patient awoke this morning with dysuria, urgency, and hesitancy.  She notice some mild hematuria also.  She has had sweats but no fever.  The history is provided by the patient.  Dysuria  Pain quality:  Burning Pain severity:  Mild Onset quality:  Sudden Duration:  9 hours Timing:  Constant Progression:  Unchanged Chronicity:  New Recent urinary tract infections: no   Relieved by:  None tried Worsened by:  Nothing Ineffective treatments:  None tried Urinary symptoms: frequent urination, hematuria and hesitancy   Urinary symptoms: no discolored urine, no foul-smelling urine and no bladder incontinence   Associated symptoms: no abdominal pain, no fever, no flank pain, no nausea, no vaginal discharge and no vomiting   Risk factors: recurrent urinary tract infections     Past Medical History:  Diagnosis Date  . Achilles tendon injury   . ADD (attention deficit disorder)   . Depression   . IBS (irritable bowel syndrome)   . Migraine   . Obese   . Ovarian cyst   . PMDD (premenstrual dysphoric disorder)   . Vaso-vagal reaction     Patient Active Problem List   Diagnosis Date Noted  . History of nephrolithiasis 01/23/2018  . Dysfunction of left rotator cuff 09/29/2016  . Pulmonary nodule, right 06/02/2016  . Needs flu shot 02/26/2016  . Primary osteoarthritis of left knee 01/09/2015  . Rosacea 11/01/2013  . Neuropathy of right ulnar nerve at wrist 06/05/2013  . HLD (hyperlipidemia) 04/16/2013  . Plantar fasciitis, bilateral 12/08/2012  . Anxiety 03/10/2012  . Major depressive disorder, recurrent episode, moderate ([place]) 07/14/2011  . Cervical radiculopathy 01/23/2011  . CARDIAC MURMUR 07/01/2010  . NONSPECIFIC MESENTERIC LYMPHADENITIS  01/29/2010  . NIGHT SWEATS 01/28/2010  . BACK PAIN 01/12/2010  . HEADACHE 09/16/2009  . [place] DISEASE, LUMBAR 06/24/2009  . SINUSITIS, CHRONIC 06/04/2009  . ALKALINE PHOSPHATASE, ELEVATED 03/19/2009  . Diaphragmatic hernia 12/26/2008  . Other and unspecified ovarian cyst 12/17/2008  . Attention deficit disorder 10/31/2007    Past Surgical History:  Procedure Laterality Date  . LAPAROSCOPIC CHOLECYSTECTOMY  2011    OB History   None      Home Medications    Prior to Admission medications   Medication Sig Start Date End Date Taking? Authorizing Provider  ALPRAZolam Duanne Moron) 0.5 MG tablet TAKE 1/2 TO 1 TABLET BY MOUTH DAILY AS NEEDED 12/15/17   Hali Marry, MD  citalopram (CELEXA) 20 MG tablet TAKE 1 TABLET(20 MG) BY MOUTH DAILY 12/01/17   Hali Marry, MD  doxepin (SINEQUAN) 25 MG capsule Take by mouth. 11/24/17 02/22/18  [provider]  fluticasone (FLOVENT HFA) 110 MCG/ACT inhaler Inhale 2 puffs into the lungs 2 (two) times daily. For cough 12/04/15   Emeterio Reeve, DO  levonorgestrel-ethinyl estradiol (SEASONALE,INTROVALE,JOLESSA) 0.15-0.03 MG tablet TAKE 1 TABLET BY MOUTH  DAILY 06/09/17   Hali Marry, MD  methylphenidate (RITALIN) 10 MG tablet Take 1 tablet (10 mg total) by mouth 2 (two) times daily with breakfast and lunch. 05/21/18   Hali Marry, MD  methylphenidate (RITALIN) 10 MG tablet Take 1 tablet (10 mg total) by mouth 2 (two) times daily. 04/21/18   Hali Marry, MD  methylphenidate (  RITALIN) 10 MG tablet Take 1 tablet (10 mg total) by mouth 2 (two) times daily. 03/23/18   Hali Marry, MD  nitrofurantoin, macrocrystal-monohydrate, (MACROBID) 100 MG capsule Take 1 capsule (100 mg total) by mouth 2 (two) times daily. Take with food. 04/27/18   Kandra Nicolas, MD  omeprazole (PRILOSEC) 40 MG capsule TAKE 1 CAPSULE(40 MG) BY MOUTH DAILY 05/31/17   Gregor Hams, MD  phenazopyridine (PYRIDIUM) 200 MG tablet  Take 1 tablet (200 mg total) by mouth 3 (three) times daily. Take after meals. 04/27/18   Kandra Nicolas, MD    Family History Family History  Problem Relation Age of Onset  . Skin cancer Mother        skin  . Prostate cancer Father   . Heart disease Father        AMI  . Heart failure Father   . Stroke Brother 40  . Breast cancer Maternal Grandmother   . Uterine cancer Maternal Grandmother        ? cervix  . Breast cancer Paternal Grandmother     Social History Social History   Tobacco Use  . Smoking status: Never Smoker  . Smokeless tobacco: Never Used  Substance Use Topics  . Alcohol use: Yes    Comment: 1/ in 6 months  . Drug use: No     Allergies   Ciprofloxacin and Zofran [ondansetron hcl]   Review of Systems Review of Systems  Constitutional: Positive for diaphoresis. Negative for fever.  Gastrointestinal: Negative for abdominal pain, nausea and vomiting.  Genitourinary: Positive for dysuria, frequency, hematuria and urgency. Negative for flank pain, pelvic pain and vaginal discharge.  All other systems reviewed and are negative.    Physical Exam Triage Vital Signs ED Triage Vitals  Enc Vitals Group     BP 04/27/18 1551 (!) 132/92     Pulse Rate 04/27/18 1551 99     Resp --      Temp 04/27/18 1551 98.1 F (36.7 C)     Temp Source 04/27/18 1551 Oral     SpO2 --      Weight 04/27/18 1553 242 lb 12.8 oz (110.1 kg)     Height 04/27/18 1553 5\' 7"  (1.702 m)     Head Circumference --      Peak Flow --      Pain Score 04/27/18 1552 3     Pain Loc --      Pain Edu? --      Excl. in [place]? --    No data found.  Updated Vital Signs BP (!) 132/92 (BP Location: Right Arm)   Pulse 99   Temp 98.1 F (36.7 C) (Oral)   Ht 5\' 7"  (1.702 m)   Wt 110.1 kg   LMP 04/20/2018 (Approximate)   BMI 38.03 kg/m   Visual Acuity Right Eye Distance:   Left Eye Distance:   Bilateral Distance:    Right Eye Near:   Left Eye Near:    Bilateral Near:     Physical  Exam Nursing notes and Vital Signs reviewed. Appearance:  Patient appears stated age, and in no acute distress.    Eyes:  Pupils are equal, round, and reactive to light and accomodation.  Extraocular movement is intact.  Conjunctivae are not inflamed   Pharynx:  Normal; moist mucous membranes  Neck:  Supple.  No adenopathy Lungs:  Clear to auscultation.  Breath sounds are equal.  Moving air well. Heart:  Regular rate and  rhythm without murmurs, rubs, or gallops.  Abdomen:  Nontender without masses or hepatosplenomegaly.  Bowel sounds are present.  No CVA or flank tenderness.  Extremities:  No edema.  Skin:  No rash present.     UC Treatments / Results  Labs (all labs ordered are listed, but only abnormal results are displayed) Labs Reviewed  POCT URINALYSIS DIP (MANUAL ENTRY) - Abnormal; Notable for the following components:      Result Value   Bilirubin, UA small (*)    Ketones, POC UA trace (5) (*)    Spec Grav, UA >=1.030 (*)    Blood, UA large (*)    Protein Ur, POC =100 (*)    Leukocytes, UA Trace (*)    All other components within normal limits  URINE CULTURE    EKG None  Radiology No results found.  Procedures Procedures (including critical care time)  Medications Ordered in UC Medications - No data to display  Initial Impression / Assessment and Plan / UC Course  I have reviewed the triage vital signs and the nursing notes.  Pertinent labs & imaging results that were available during my care of the patient were reviewed by me and considered in my medical decision making (see chart for details).    Urine culture pending. Begin Macrobid 100mg  BID for one week. Rx for Pyridium Followup with Family Doctor if not improved in one week.    Final Clinical Impressions(s) / UC Diagnoses   Final diagnoses:  Dysuria  Cystitis     Discharge Instructions     Increase fluid intake. If symptoms become significantly worse during the night or over the weekend,  proceed to the local emergency room.     ED Prescriptions    Medication Sig Dispense Auth. Provider   nitrofurantoin, macrocrystal-monohydrate, (MACROBID) 100 MG capsule Take 1 capsule (100 mg total) by mouth 2 (two) times daily. Take with food. 14 capsule Kandra Nicolas, MD   phenazopyridine (PYRIDIUM) 200 MG tablet Take 1 tablet (200 mg total) by mouth 3 (three) times daily. Take after meals. 6 tablet Kandra Nicolas, MD         Kandra Nicolas, MD 04/27/18 [phone]

## 2018-04-27 NOTE — Discharge Instructions (Addendum)
Increase fluid intake. °If symptoms become significantly worse during the night or over the weekend, proceed to the local emergency room.  °

## 2018-04-29 ENCOUNTER — Telehealth: Payer: Self-pay | Admitting: Emergency Medicine

## 2018-04-29 LAB — URINE CULTURE
MICRO NUMBER: 91312907
Result:: NO GROWTH
SPECIMEN QUALITY: ADEQUATE

## 2018-04-29 NOTE — Telephone Encounter (Signed)
Attempted to contact patient. Left detailed message with negative urine cx results. Advised to continue taking Macrobid medication and to f/u with PCP in 1 week if no improvement. Candie Chroman, RN

## 2018-04-30 ENCOUNTER — Telehealth: Payer: Self-pay | Admitting: Emergency Medicine

## 2018-04-30 NOTE — Telephone Encounter (Signed)
Attempted to contact x2. Left detailed message to stop prescribed antibiotic and f/u with  Family doctor.

## 2018-06-06 ENCOUNTER — Other Ambulatory Visit: Payer: Self-pay | Admitting: Family Medicine

## 2018-07-10 ENCOUNTER — Other Ambulatory Visit: Payer: Self-pay | Admitting: Family Medicine

## 2018-07-19 ENCOUNTER — Emergency Department (INDEPENDENT_AMBULATORY_CARE_PROVIDER_SITE_OTHER)
Admission: EM | Admit: 2018-07-19 | Discharge: 2018-07-19 | Disposition: A | Discharge status: Home / Self Care | Payer: 59 | Source: Home / Self Care | Attending: Family Medicine | Admitting: Family Medicine

## 2018-07-19 ENCOUNTER — Encounter: Payer: Self-pay | Admitting: Emergency Medicine

## 2018-07-19 ENCOUNTER — Other Ambulatory Visit: Payer: Self-pay

## 2018-07-19 DIAGNOSIS — R109 Unspecified abdominal pain: Secondary | ICD-10-CM

## 2018-07-19 LAB — POCT URINALYSIS DIP (MANUAL ENTRY)
Bilirubin, UA: NEGATIVE
Blood, UA: NEGATIVE
Glucose, UA: NEGATIVE mg/dL
Ketones, POC UA: NEGATIVE mg/dL
Leukocytes, UA: NEGATIVE
Nitrite, UA: NEGATIVE
Protein Ur, POC: NEGATIVE mg/dL
Spec Grav, UA: 1.02 (ref 1.010–1.025)
Urobilinogen, UA: 1 E.U./dL
pH, UA: 7 (ref 5.0–8.0)

## 2018-07-19 MED ORDER — HYDROCODONE-ACETAMINOPHEN 5-325 MG PO TABS: 1.0000 | ORAL_TABLET | Freq: Four times a day (QID) | ORAL | 0 refills | Status: DC | PRN

## 2018-07-19 NOTE — ED Provider Notes (Signed)
[facility]    CSN: [id number] Arrival date & time: 07/19/18  1505     History   Chief Complaint Chief Complaint  Patient presents with  . Back Pain    HPI Kristina Martinez is a 48 y.o. female.   HPI  Kristina Martinez is a 48 y.o. female presenting to UC with c/o Right flank pain that started this morning when she woke up.  Pain is sharp and stabbing, waxes and wanes. Does not radiate. She also reports 2 weeks of nausea, vomiting and diarrhea but reports hx of chronic GI symptoms including IBS. She had her gallbladder removed 8-9 years ago. No n/v/d today. Denies fever or chills. No chest pain.  Hx of kidney stone in the past that passed on its own. Pt only had lower abdominal pain at that time, the stone was already about to enter the bladder.  No heavy lifting or known injury.    Past Medical History:  Diagnosis Date  . Achilles tendon injury   . ADD (attention deficit disorder)   . Depression   . IBS (irritable bowel syndrome)   . Migraine   . Obese   . Ovarian cyst   . PMDD (premenstrual dysphoric disorder)   . Vaso-vagal reaction     Patient Active Problem List   Diagnosis Date Noted  . History of nephrolithiasis 01/23/2018  . Dysfunction of left rotator cuff 09/29/2016  . Pulmonary nodule, right 06/02/2016  . Needs flu shot 02/26/2016  . Primary osteoarthritis of left knee 01/09/2015  . Rosacea 11/01/2013  . Neuropathy of right ulnar nerve at wrist 06/05/2013  . HLD (hyperlipidemia) 04/16/2013  . Plantar fasciitis, bilateral 12/08/2012  . Anxiety 03/10/2012  . Major depressive disorder, recurrent episode, moderate ([place]) 07/14/2011  . Cervical radiculopathy 01/23/2011  . CARDIAC MURMUR 07/01/2010  . NONSPECIFIC MESENTERIC LYMPHADENITIS 01/29/2010  . NIGHT SWEATS 01/28/2010  . BACK PAIN 01/12/2010  . HEADACHE 09/16/2009  . [place] DISEASE, LUMBAR 06/24/2009  . SINUSITIS, CHRONIC 06/04/2009  . ALKALINE PHOSPHATASE, ELEVATED 03/19/2009  .  Diaphragmatic hernia 12/26/2008  . Other and unspecified ovarian cyst 12/17/2008  . Attention deficit disorder 10/31/2007    Past Surgical History:  Procedure Laterality Date  . LAPAROSCOPIC CHOLECYSTECTOMY  2011    OB History   No obstetric history on file.      Home Medications    Prior to Admission medications   Medication Sig Start Date End Date Taking? Authorizing Provider  ALPRAZolam Duanne Moron) 0.5 MG tablet TAKE 1/2 TO 1 TABLET BY MOUTH DAILY AS NEEDED 12/15/17   Hali Marry, MD  citalopram (CELEXA) 20 MG tablet TAKE 1 TABLET(20 MG) BY MOUTH DAILY 06/06/18   Hali Marry, MD  doxepin (SINEQUAN) 25 MG capsule Take by mouth. 11/24/17 02/22/18  [provider]  fluticasone (FLOVENT HFA) 110 MCG/ACT inhaler Inhale 2 puffs into the lungs 2 (two) times daily. For cough 12/04/15   Emeterio Reeve, DO  HYDROcodone-acetaminophen (NORCO/VICODIN) 5-325 MG tablet Take 1 tablet by mouth every 6 (six) hours as needed. 07/19/18   Noe Gens, PA-C  levonorgestrel-ethinyl estradiol (SEASONALE,INTROVALE,JOLESSA) 0.15-0.03 MG tablet TAKE 1 TABLET BY MOUTH  DAILY 07/11/18   Hali Marry, MD  methylphenidate (RITALIN) 10 MG tablet Take 1 tablet (10 mg total) by mouth 2 (two) times daily with breakfast and lunch. 05/21/18   Hali Marry, MD  methylphenidate (RITALIN) 10 MG tablet Take 1 tablet (10 mg total) by mouth 2 (two) times daily. 04/21/18  Hali Marry, MD  methylphenidate (RITALIN) 10 MG tablet Take 1 tablet (10 mg total) by mouth 2 (two) times daily. 03/23/18   Hali Marry, MD  nitrofurantoin, macrocrystal-monohydrate, (MACROBID) 100 MG capsule Take 1 capsule (100 mg total) by mouth 2 (two) times daily. Take with food. 04/27/18   Kandra Nicolas, MD  phenazopyridine (PYRIDIUM) 200 MG tablet Take 1 tablet (200 mg total) by mouth 3 (three) times daily. Take after meals. 04/27/18   Kandra Nicolas, MD    Family History Family  History  Problem Relation Age of Onset  . Skin cancer Mother        skin  . Prostate cancer Father   . Heart disease Father        AMI  . Heart failure Father   . Stroke Brother 40  . Breast cancer Maternal Grandmother   . Uterine cancer Maternal Grandmother        ? cervix  . Breast cancer Paternal Grandmother     Social History Social History   Tobacco Use  . Smoking status: Never Smoker  . Smokeless tobacco: Never Used  Substance Use Topics  . Alcohol use: Yes    Comment: 1/ in 6 months  . Drug use: No     Allergies   Ciprofloxacin and Zofran [ondansetron hcl]   Review of Systems Review of Systems  Constitutional: Negative for chills and fever.  Gastrointestinal: Positive for diarrhea, nausea and vomiting. Negative for abdominal pain.  Genitourinary: Positive for flank pain. Negative for dysuria, frequency, hematuria, pelvic pain and urgency.  Musculoskeletal: Positive for back pain. Negative for myalgias.     Physical Exam Triage Vital Signs ED Triage Vitals  Enc Vitals Group     BP 07/19/18 1526 (!) 142/84     Pulse Rate 07/19/18 1526 83     Resp 07/19/18 1526 18     Temp 07/19/18 1526 98 F (36.7 C)     Temp Source 07/19/18 1526 Oral     SpO2 07/19/18 1526 96 %     Weight 07/19/18 1527 246 lb (111.6 kg)     Height 07/19/18 1527 5\' 7"  (1.702 m)     Head Circumference --      Peak Flow --      Pain Score 07/19/18 1527 7     Pain Loc --      Pain Edu? --      Excl. in [place]? --    No data found.  Updated Vital Signs BP (!) 142/84 (BP Location: Right Arm)   Pulse 83   Temp 98 F (36.7 C) (Oral)   Resp 18   Ht 5\' 7"  (1.702 m)   Wt 246 lb (111.6 kg)   SpO2 96%   BMI 38.53 kg/m   Visual Acuity Right Eye Distance:   Left Eye Distance:   Bilateral Distance:    Right Eye Near:   Left Eye Near:    Bilateral Near:     Physical Exam Vitals signs and nursing note reviewed.  Constitutional:      Appearance: Normal appearance. She is  well-developed.  HENT:     Head: Normocephalic and atraumatic.  Neck:     Musculoskeletal: Normal range of motion.  Cardiovascular:     Rate and Rhythm: Normal rate and regular rhythm.  Pulmonary:     Effort: Pulmonary effort is normal. No respiratory distress.     Breath sounds: Normal breath sounds. No stridor. No wheezing or rhonchi.  Abdominal:     General: There is no distension.     Palpations: Abdomen is soft.     Tenderness: There is no abdominal tenderness. There is no right CVA tenderness or left CVA tenderness.  Musculoskeletal: Normal range of motion.        General: No tenderness.     Comments: No spinal or back muscular tenderness. Full ROM UE & LE  Skin:    General: Skin is warm and dry.  Neurological:     Mental Status: She is alert and oriented to person, place, and time.  Psychiatric:        Behavior: Behavior normal.      UC Treatments / Results  Labs (all labs ordered are listed, but only abnormal results are displayed) Labs Reviewed  POCT URINALYSIS DIP (MANUAL ENTRY)    EKG None  Radiology No results found.  Procedures Procedures (including critical care time)  Medications Ordered in UC Medications - No data to display  Initial Impression / Assessment and Plan / UC Course  I have reviewed the triage vital signs and the nursing notes.  Pertinent labs & imaging results that were available during my care of the patient were reviewed by me and considered in my medical decision making (see chart for details).     Normal exam. UA: normal Discussed possible small ureteral stone. Discussed renal U/S vs watchful waiting. Pt would like to try to increase fluids and pain control, will f/u if not improving   Final Clinical Impressions(s) / UC Diagnoses   Final diagnoses:  Right flank pain     Discharge Instructions      The pain may be due to a muscle spasm, small kidney stone, or even possible early shingles. If you develop a rash or  worsening symptoms, please be reevaluated immediately.   You may take tylenol and ibuprofen as needed for pain.  Norco/Vicodin (hydrocodone-acetaminophen) is a narcotic pain medication, do not combine these medications with others containing tylenol. While taking, do not drink alcohol, drive, or perform any other activities that requires focus while taking these medications.   Call to schedule an appointment with primary care next week if not improving.     ED Prescriptions    Medication Sig Dispense Auth. Provider   HYDROcodone-acetaminophen (NORCO/VICODIN) 5-325 MG tablet Take 1 tablet by mouth every 6 (six) hours as needed. 6 tablet Noe Gens, PA-C     Controlled Substance Prescriptions [place] Controlled Substance Registry consulted? Yes, I have consulted the [place] Controlled Substances Registry for this patient, and feel the risk/benefit ratio today is favorable for proceeding with this prescription for a controlled substance.   Noe Gens, [place] 07/19/18 1722

## 2018-07-19 NOTE — Discharge Instructions (Signed)
°  The pain may be due to a muscle spasm, small kidney stone, or even possible early shingles. If you develop a rash or worsening symptoms, please be reevaluated immediately.   You may take tylenol and ibuprofen as needed for pain.  Norco/Vicodin (hydrocodone-acetaminophen) is a narcotic pain medication, do not combine these medications with others containing tylenol. While taking, do not drink alcohol, drive, or perform any other activities that requires focus while taking these medications.   Call to schedule an appointment with primary care next week if not improving.

## 2018-07-19 NOTE — ED Triage Notes (Signed)
Patient reports 2 weeks of intermittent nausea, vomiting, and diarrhea; does has GI issues chronically. Today she has had waves of pain in right sub scapular area; took ibuprofen 600mg  at 1030; not in pain right now.

## 2018-07-21 ENCOUNTER — Telehealth: Payer: Self-pay

## 2018-07-21 NOTE — Telephone Encounter (Signed)
Left message on VM with contact information if any questions or concerns.

## 2018-09-06 ENCOUNTER — Other Ambulatory Visit: Payer: Self-pay | Admitting: Family Medicine

## 2018-10-12 ENCOUNTER — Other Ambulatory Visit: Payer: Self-pay | Admitting: Family Medicine

## 2018-10-12 DIAGNOSIS — F988 Other specified behavioral and emotional disorders with onset usually occurring in childhood and adolescence: Secondary | ICD-10-CM

## 2018-10-13 NOTE — Telephone Encounter (Signed)
Needs virtual visit. Was supposed to f/u in Jan. ADD med are scheduled drugs. Can put on my schedule for next week or if needs her meds ASAP can schedule with one of my partners for today.

## 2018-12-01 ENCOUNTER — Ambulatory Visit (INDEPENDENT_AMBULATORY_CARE_PROVIDER_SITE_OTHER): Payer: 59

## 2018-12-01 ENCOUNTER — Other Ambulatory Visit: Payer: Self-pay

## 2018-12-01 ENCOUNTER — Ambulatory Visit (INDEPENDENT_AMBULATORY_CARE_PROVIDER_SITE_OTHER): Payer: 59 | Admitting: Family Medicine

## 2018-12-01 ENCOUNTER — Encounter: Payer: Self-pay | Admitting: Family Medicine

## 2018-12-01 VITALS — BP 139/78 | HR 88 | Ht 67.0 in | Wt 248.0 lb

## 2018-12-01 DIAGNOSIS — M25461 Effusion, right knee: Secondary | ICD-10-CM | POA: Diagnosis not present

## 2018-12-01 DIAGNOSIS — M25561 Pain in right knee: Secondary | ICD-10-CM | POA: Diagnosis not present

## 2018-12-01 MED ORDER — HYDROCODONE-ACETAMINOPHEN 5-325 MG PO TABS: 1.0000 | ORAL_TABLET | Freq: Four times a day (QID) | ORAL | 0 refills | Status: DC | PRN

## 2018-12-01 NOTE — Patient Instructions (Signed)
Thank you for coming in today. Call or go to the ER if you develop a large red swollen joint with extreme pain or oozing puss.  I will keep you updated on knee results.  Use norco for severe pain.  OK to work if feeling well.    Knee Effusion Knee effusion refers to excess fluid in the knee joint. This can cause pain and swelling in your knee. Knee effusion creates more pressure than usual in your knee joint. This makes it more difficult for you to bend and move your knee. If there is fluid in your knee, it often means that something is wrong inside your knee. This can be a result of:  Severe arthritis.  Injury to the knee muscles, ligaments, or cartilage.  Infection.  Autoimmune disease. This means that your body's defense system (immune system) mistakenly attacks healthy body tissues. Follow these instructions at home: Medicines  Take over-the-counter and prescription medicines only as told by your health care provider.  Do not drive or use heavy machinery while taking prescription pain medicine.  If you are taking prescription pain medicine, take actions to prevent or treat constipation. Your health care provider may recommend that you: ? Drink enough fluid to keep your urine pale yellow. ? Eat foods that are high in fiber, such as fresh fruits and vegetables, whole grains, and beans. ? Limit foods that are high in fat and processed sugars, such as fried or sweet foods. ? Take an over-the-counter or prescription medicine for constipation. If you have a brace:  Wear the brace as told by your health care provider. Remove it only as told by your health care provider.  Loosen the brace if your toes tingle, become numb, or turn cold and blue.  Keep the brace clean.  If the brace is not waterproof: ? Do not let it get wet. ? Cover it with a watertight covering when you take a bath or a shower. Managing pain, stiffness, and swelling   If directed, put ice on the swollen area:  ? If you have a removable brace, remove it as told by your health care provider. ? Put ice in a plastic bag. ? Place a towel between your skin and the bag. ? Leave the ice on for 20 minutes, 2-3 times per day.  Raise (elevate) your knee at or above the level of your heart while you are sitting or lying down. General instructions  Do not use any products that contain nicotine or tobacco, such as cigarettes and e-cigarettes. These can delay healing. If you need help quitting, ask your health care provider.  Do not use the injured limb to support your body weight until your health care provider says it is okay. Use crutches as told by your health care provider.  Do any rehabilitation or strengthening exercises as told by your health care provider.  Rest as told by your health care provider. ? Avoid sitting for a long time without moving. Get up to take short walks every 1-2 hours. This is important to improve blood flow and breathing. Ask for help if you feel weak or unsteady.  Keep all follow-up visits as told by your health care provider. This is important. Contact a health care provider if you:  Continue to have pain in your knee. Get help right away if you:  Have swelling or redness of your knee that gets worse or does not get better.  Have severe pain in your knee.  Have a fever.  Summary  Knee effusion refers to excess fluid in the knee joint. This causes pain and swelling and makes it difficult to bend and move your knee.  Effusion may be caused by severe arthritis, autoimmune disease, infection, or injury to the knee muscles, ligaments, or cartilage.  Take over-the-counter and prescription medicines only as told by your health care provider.  If you have a brace, wear the brace as told by your health care provider. This information is not intended to replace advice given to you by your health care provider. Make sure you discuss any questions you have with your health care  provider. Document Released: 09/04/2003 Document Revised: 03/03/2018 Document Reviewed: 06/26/2017 Elsevier Interactive Patient Education  2019 [employer].

## 2018-12-01 NOTE — Progress Notes (Signed)
Kristina Martinez is a 48 y.o. female who presents to [facility] today for right knee pain. . Patient developed pain and swelling at the right knee starting about 5 days ago.  She notes pain is primarily located at the anterior superior knee sometimes to the medial knee.  Pain is worse with activity including standing from a seated position going up and down stairs and sitting with knee flexed.  She denies any radiating pain weakness or numbness.  She notes a bit of clicking and sometimes a sensation that her knee gives way.  She denies any locking or weakness otherwise.  She tried ice and ibuprofen which helps a little.  She denies any fevers chills nausea vomiting or diarrhea and feels well otherwise.     ROS:  As above  Exam:  BP 139/78    Pulse 88    Ht 5\' 7"  (1.702 m)    Wt 248 lb (112.5 kg)    BMI 38.84 kg/m  Wt Readings from Last 5 Encounters:  12/01/18 248 lb (112.5 kg)  07/19/18 246 lb (111.6 kg)  04/27/18 242 lb 12.8 oz (110.1 kg)  03/23/18 246 lb (111.6 kg)  12/15/17 245 lb (111.1 kg)   General: Well Developed, well nourished, and in no acute distress.  Neuro/Psych: Alert and oriented x3, extra-ocular muscles intact, able to move all 4 extremities, sensation grossly intact. Skin: Warm and dry, no rashes noted.  Respiratory: Not using accessory muscles, speaking in full sentences, trachea midline.  Cardiovascular: Pulses palpable, no extremity edema. Abdomen: Does not appear distended. MSK:  Right knee: Large effusion present no deformity or erythema. Tender palpation mildly at quadricep insertion onto superior patella.  Tender palpation medial joint line.  Nontender otherwise. Range of motion 0-120 degrees without crepitations. Stable ligamentous exam. Mildly positive medial McMurray's test. Pain with resisted extension however strength is present extension and flexion.  Contralateral left knee no effusion normal-appearing  nontender normal range of motion.  Stable ligamentous exam.  Intact strength.  Negative McMurray's test.    Lab and Radiology Results X-ray images right knee personally independently reviewed. Minimal degenerative changes at patellofemoral joint.  No fractures or deformity. Await formal radiology review  Limited musculoskeletal ultrasound of right knee: Large superior patellar effusion. Superior patella has a small bone spur at the insertion of the quad tendon with surrounding mild hypoechoic changes to the tendon structures in this area.  Tendon fibers are intact with no obvious tear.  No increased Doppler activity at this area. Medial meniscus and joint line shows degenerative changes with spur formation.  No obvious meniscus tear. Lateral meniscus normal-appearing Patellar tendon normal-appearing. No other bony changes or abnormalities.  Aspiration injection right knee. Consent obtained and timeout performed. Skin at lateral superior patellar space cleaned with rubbing alcohol and cold spray applied.  4 mL of lidocaine injected achieving good analgesia. Skin again cleaned with chlorhexidine and 18-gauge needle was used to access the effusion.  30 mL of yellow clear straw-colored joint fluid was aspirated.  The syringe was exchanged and 3 mL of Marcaine and 80 mg of Depo-Medrol was injected. Patient tolerated procedure well.   Assessment and Plan: 48 y.o. female with right knee pain and swelling.  Unclear etiology with no clear injury history.  X-ray ultrasound show mild degenerative changes but nothing severe.  Her exam is largely benign as well with only minimal McMurray's test positivity.  Aspiration injection today.  Plan to send joint fluid for  culture and crystal analysis to evaluate for gout.  Will treat with injection as above.  Backup Norco prescribed for use.  Patient has a history of steroid flare with previous injection for lateral epicondylitis.  Recheck if not  improving.  PDMP not reviewed this encounter. Orders Placed This Encounter  Procedures   DG Knee 1-2 Views Left    Standing Status:   Future    Number of Occurrences:   1    Standing Expiration Date:   02/01/2020    Order Specific Question:   Reason for Exam (SYMPTOM  OR DIAGNOSIS REQUIRED)    Answer:   For use with right knee x-ray, bilateral AP and Rosenberg standing.    Order Specific Question:   Is the patient pregnant?    Answer:   No    Order Specific Question:   Preferred imaging location?    Answer:   [facility]   DG Knee Complete 4 Views Right    Please include patellar sunrise, lateral, and weightbearing bilateral AP and bilateral rosenberg views    Standing Status:   Future    Number of Occurrences:   1    Standing Expiration Date:   01/31/2020    Order Specific Question:   Reason for exam:    Answer:   Please include patellar sunrise, lateral, and weightbearing bilateral AP and bilateral rosenberg views    Comments:   Please include patellar sunrise, lateral, and weightbearing bilateral AP and bilateral rosenberg views    Order Specific Question:   Preferred imaging location?    Answer:   [facility]   No orders of the defined types were placed in this encounter.   Historical information moved to improve visibility of documentation.  Past Medical History:  Diagnosis Date   Achilles tendon injury    ADD (attention deficit disorder)    Depression    IBS (irritable bowel syndrome)    Migraine    Obese    Ovarian cyst    PMDD (premenstrual dysphoric disorder)    Vaso-vagal reaction    Past Surgical History:  Procedure Laterality Date   LAPAROSCOPIC CHOLECYSTECTOMY  2011   Social History   Tobacco Use   Smoking status: Never Smoker   Smokeless tobacco: Never Used  Substance Use Topics   Alcohol use: Yes    Comment: 1/ in 6 months   family history includes Breast cancer in her maternal grandmother and paternal grandmother;  Heart disease in her father; Heart failure in her father; Prostate cancer in her father; Skin cancer in her mother; Stroke (age of onset: 64) in her brother; Uterine cancer in her maternal grandmother.  Medications: Current Outpatient Medications  Medication Sig Dispense Refill   ALPRAZolam (XANAX) 0.5 MG tablet TAKE 1/2 TO 1 TABLET BY MOUTH DAILY AS NEEDED 15 tablet 3   citalopram (CELEXA) 20 MG tablet Take 1 tablet (20 mg total) by mouth daily. [place].MUST SCHEDULE/KEEP APPOINTMENT FOR REFILLS 30 tablet 0   doxepin (SINEQUAN) 25 MG capsule Take by mouth.     fluticasone (FLOVENT HFA) 110 MCG/ACT inhaler Inhale 2 puffs into the lungs 2 (two) times daily. For cough 1 Inhaler 0   HYDROcodone-acetaminophen (NORCO/VICODIN) 5-325 MG tablet Take 1 tablet by mouth every 6 (six) hours as needed. 6 tablet 0   levonorgestrel-ethinyl estradiol (SEASONALE,INTROVALE,JOLESSA) 0.15-0.03 MG tablet TAKE 1 TABLET BY MOUTH  DAILY 91 tablet 4   methylphenidate (RITALIN) 10 MG tablet Take 1 tablet (10 mg total) by  mouth 2 (two) times daily with breakfast and lunch. 60 tablet 0   methylphenidate (RITALIN) 10 MG tablet Take 1 tablet (10 mg total) by mouth 2 (two) times daily. 60 tablet 0   methylphenidate (RITALIN) 10 MG tablet Take 1 tablet (10 mg total) by mouth 2 (two) times daily. 60 tablet 0   nitrofurantoin, macrocrystal-monohydrate, (MACROBID) 100 MG capsule Take 1 capsule (100 mg total) by mouth 2 (two) times daily. Take with food. 14 capsule 0   phenazopyridine (PYRIDIUM) 200 MG tablet Take 1 tablet (200 mg total) by mouth 3 (three) times daily. Take after meals. 6 tablet 0   No current facility-administered medications for this visit.    Allergies  Allergen Reactions   Ciprofloxacin Hives   Zofran [Ondansetron Hcl]     Migraine       Discussed warning signs or symptoms. Please see discharge instructions. Patient expresses understanding.

## 2018-12-04 ENCOUNTER — Telehealth: Payer: Self-pay | Admitting: Family Medicine

## 2018-12-04 LAB — SYNOVIAL CELL COUNT + DIFF, W/ CRYSTALS
Basophils, %: 0 %
Eosinophils-Synovial: 0 % (ref 0–2)
Lymphocytes-Synovial Fld: 9 % (ref 0–74)
Monocyte/Macrophage: 23 % (ref 0–69)
Neutrophil, Synovial: 68 % — ABNORMAL HIGH (ref 0–24)
Synoviocytes, %: 0 % (ref 0–15)
WBC, Synovial: 173 cells/uL — ABNORMAL HIGH (ref <150)

## 2018-12-04 LAB — AEROBIC CULTURE
MICRO NUMBER:: 541778
SPECIMEN QUALITY:: ADEQUATE

## 2018-12-04 NOTE — Telephone Encounter (Signed)
In response to positive MRSA on knee fluid.  Scant bacteria seen on culture and white blood cell count was very low on the knee aspirate.  This is unlikely to be a true knee septic joint.  I discussed with on-call infectious disease doctor.  Based on patient's improving symptoms and lab interpretation this is probably reflects a contaminant.  Plan for watchful waiting and if symptoms worsen will proceed with re-aspiration and empiric antibiotic therapy.  Antibiotic therapy for this problem would typically be 4 weeks of IV antibiotics which is a big ask.  I called patient and discussed the plan with her.  She expresses understanding and agreement.  Watchful waiting recheck if worsening ASAP.

## 2018-12-10 ENCOUNTER — Other Ambulatory Visit: Payer: Self-pay | Admitting: Family Medicine

## 2018-12-28 ENCOUNTER — Ambulatory Visit (INDEPENDENT_AMBULATORY_CARE_PROVIDER_SITE_OTHER): Payer: 59 | Admitting: Family Medicine

## 2018-12-28 ENCOUNTER — Encounter: Payer: Self-pay | Admitting: Family Medicine

## 2018-12-28 DIAGNOSIS — M25561 Pain in right knee: Secondary | ICD-10-CM | POA: Diagnosis not present

## 2018-12-28 NOTE — Progress Notes (Signed)
Kristina Martinez is a 48 y.o. female who presents to [facility] today for right knee pain.  Kristina Martinez  was seen 4 weeks ago for right knee pain.  At that time she had effusion and pain.  Aspiration of knee fluid and injection was performed.  Aspirate did not show gout crystals or elevated white blood cell count.  Aspirate did however grow MRSA.  After conversation with patient and review of information the consensus including infectious disease was thought to be contamination.  In the interim she had pretty good pain response to the steroid injection.  However it only lasted about 2 or 3 weeks.  She notes the pain is recurring.  She notes more swelling associated with locking and catching at times.  No fevers or chills.    ROS:  As above  Exam:  BP 116/83   Pulse 98   Temp 98.4 F (36.9 C) (Oral)   Wt 250 lb (113.4 kg)   BMI 39.16 kg/m  Wt Readings from Last 5 Encounters:  12/28/18 250 lb (113.4 kg)  12/01/18 248 lb (112.5 kg)  07/19/18 246 lb (111.6 kg)  04/27/18 242 lb 12.8 oz (110.1 kg)  03/23/18 246 lb (111.6 kg)   General: Well Developed, well nourished, and in no acute distress.  Neuro/Psych: Alert and oriented x3, extra-ocular muscles intact, able to move all 4 extremities, sensation grossly intact. Skin: Warm and dry, no rashes noted.  Respiratory: Not using accessory muscles, speaking in full sentences, trachea midline.  Cardiovascular: Pulses palpable, no extremity edema. Abdomen: Does not appear distended. MSK: Right knee: Mild effusion no erythema. Range of motion 0-120 degrees. Tender to palpation medial joint line. Stable to ligamentous exam without laxity or pain. Positive medial McMurray's test. Intact strength. Mild antalgic gait.    Lab and Radiology Results Dg Knee 1-2 Views Left  Result Date: 12/01/2018 CLINICAL DATA:  Chronic bilateral knee pain which has worsened on the right over the past 2 weeks. No known  injury. EXAM: LEFT KNEE - 1-2 VIEW; RIGHT KNEE - COMPLETE 4+ VIEW COMPARISON:  AP views of both knees and lateral and sunrise views of the left knee 01/09/2015. FINDINGS: Joint spaces are preserved. Minimal osteophytosis is present about both knees. No chondrocalcinosis. Small right joint effusion is noted. No worrisome bony lesion. IMPRESSION: No acute finding. Mild osteophytosis about both knees consistent with mild osteoarthritis. Joint spaces are preserved. Small right knee joint effusion. Electronically Signed   By: Inge Rise M.D.   On: 12/01/2018 16:11   Dg Knee Complete 4 Views Right  Result Date: 12/01/2018 CLINICAL DATA:  Chronic bilateral knee pain which has worsened on the right over the past 2 weeks. No known injury. EXAM: LEFT KNEE - 1-2 VIEW; RIGHT KNEE - COMPLETE 4+ VIEW COMPARISON:  AP views of both knees and lateral and sunrise views of the left knee 01/09/2015. FINDINGS: Joint spaces are preserved. Minimal osteophytosis is present about both knees. No chondrocalcinosis. Small right joint effusion is noted. No worrisome bony lesion. IMPRESSION: No acute finding. Mild osteophytosis about both knees consistent with mild osteoarthritis. Joint spaces are preserved. Small right knee joint effusion. Electronically Signed   By: Inge Rise M.D.   On: 12/01/2018 16:11   I personally (independently) visualized and performed the interpretation of the images attached in this note.     Assessment and Plan: 48 y.o. female with right knee pain.  Very likely meniscus tear based on partial response to  steroid injection at positive McMurray's test and mechanical symptoms.  Given failure of typical conservative management next step would be MRI to evaluate for surgical planning.  Knee fluid culture likely contaminant.  Patient does not have any signs or symptoms suggestive of septic joint.  Do not think the risk of repeat aspiration is worth the minimal benefit of confirming negative culture.    PDMP not reviewed this encounter. Orders Placed This Encounter  Procedures  . MR Knee Right Wo Contrast    Standing Status:   Future    Standing Expiration Date:   02/28/2020    Order Specific Question:   What is the patient's sedation requirement?    Answer:   No Sedation    Order Specific Question:   Does the patient have a pacemaker or implanted devices?    Answer:   No    Order Specific Question:   Preferred imaging location?    Answer:   [facility] (table limit-350lbs)    Order Specific Question:   Radiology Contrast Protocol - do NOT remove file path    Answer:   [email or web address]   No orders of the defined types were placed in this encounter.   Historical information moved to improve visibility of documentation.  Past Medical History:  Diagnosis Date  . Achilles tendon injury   . ADD (attention deficit disorder)   . Depression   . IBS (irritable bowel syndrome)   . Migraine   . Obese   . Ovarian cyst   . PMDD (premenstrual dysphoric disorder)   . Vaso-vagal reaction    Past Surgical History:  Procedure Laterality Date  . LAPAROSCOPIC CHOLECYSTECTOMY  2011   Social History   Tobacco Use  . Smoking status: Never Smoker  . Smokeless tobacco: Never Used  Substance Use Topics  . Alcohol use: Yes    Comment: 1/ in 6 months   family history includes Breast cancer in her maternal grandmother and paternal grandmother; Heart disease in her father; Heart failure in her father; Prostate cancer in her father; Skin cancer in her mother; Stroke (age of onset: 76) in her brother; Uterine cancer in her maternal grandmother.  Medications: Current Outpatient Medications  Medication Sig Dispense Refill  . ALPRAZolam (XANAX) 0.5 MG tablet TAKE 1/2 TO 1 TABLET BY MOUTH DAILY AS NEEDED 15 tablet 3  . citalopram (CELEXA) 20 MG tablet Take 1 tablet (20 mg total) by mouth daily. [place].MUST SCHEDULE/KEEP APPOINTMENT FOR REFILLS 30  tablet 0  . doxepin (SINEQUAN) 25 MG capsule Take by mouth.    . fluticasone (FLOVENT HFA) 110 MCG/ACT inhaler Inhale 2 puffs into the lungs 2 (two) times daily. For cough 1 Inhaler 0  . HYDROcodone-acetaminophen (NORCO/VICODIN) 5-325 MG tablet Take 1 tablet by mouth every 6 (six) hours as needed. 10 tablet 0  . levonorgestrel-ethinyl estradiol (SEASONALE,INTROVALE,JOLESSA) 0.15-0.03 MG tablet TAKE 1 TABLET BY MOUTH  DAILY 91 tablet 4  . methylphenidate (RITALIN) 10 MG tablet Take 1 tablet (10 mg total) by mouth 2 (two) times daily with breakfast and lunch. 60 tablet 0  . methylphenidate (RITALIN) 10 MG tablet Take 1 tablet (10 mg total) by mouth 2 (two) times daily. 60 tablet 0  . methylphenidate (RITALIN) 10 MG tablet Take 1 tablet (10 mg total) by mouth 2 (two) times daily. 60 tablet 0   No current facility-administered medications for this visit.    Allergies  Allergen Reactions  . Ciprofloxacin Hives  . Zofran [Ondansetron Hcl]  Migraine       Discussed warning signs or symptoms. Please see discharge instructions. Patient expresses understanding.

## 2018-12-28 NOTE — Patient Instructions (Signed)
Thank you for coming in today. You should her about MRI soon.  Let me know if you do not hear anything  Often we will schedule a video visit to go over the MRI results and look at the pictures together.    Meniscus Tear  A meniscus tear is a knee injury that happens when a piece of the meniscus is torn. The meniscus is a thick, rubbery, wedge-shaped cartilage in the knee. Two menisci are located in each knee. They sit between the upper bone (femur) and lower bone (tibia) that make up the knee joint. Each meniscus acts as a shock absorber for the knee. A torn meniscus is one of the most common types of knee injuries. This injury can range from mild to severe. Surgery may be needed to repair a severe tear. What are the causes? This condition may be caused by any kneeling, squatting, twisting, or pivoting movement. Sports-related injuries are the most common cause. These often occur from:  Running and stopping suddenly. ? Changing direction. ? Being tackled or knocked off your feet.  Lifting or carrying heavy weights. As people get older, their menisci get thinner and weaker. In these people, tears can happen more easily, such as from climbing stairs. What increases the risk? You are more likely to develop this condition if you:  Play contact sports.  Have a job that requires kneeling or squatting.  Are female.  Are over 25 years old. What are the signs or symptoms? Symptoms of this condition include:  Knee pain, especially at the side of the knee joint. You may feel pain when the injury occurs, or you may only hear a pop and feel pain later.  A feeling that your knee is clicking, catching, locking, or giving way.  Not being able to fully bend or extend your knee.  Bruising or swelling in your knee. How is this diagnosed? This condition may be diagnosed based on your symptoms and a physical exam. You may also have tests, such as:  X-rays.  MRI.  A procedure to look inside  your knee with a narrow surgical telescope (arthroscopy). You may be referred to a knee specialist (orthopedic surgeon). How is this treated? Treatment for this injury depends on the severity of the tear. Treatment for a mild tear may include:  Rest.  Medicine to reduce pain and swelling. This is usually a nonsteroidal anti-inflammatory drug (NSAID), like ibuprofen.  A knee brace, sleeve, or wrap.  Using crutches or a walker to keep weight off your knee and to help you walk.  Exercises to strengthen your knee (physical therapy). You may need surgery if you have a severe tear or if other treatments are not working. Follow these instructions at home: If you have a brace, sleeve, or wrap:  Wear it as told by your health care provider. Remove it only as told by your health care provider.  Loosen the brace, sleeve, or wrap if your toes tingle, become numb, or turn cold and blue.  Keep the brace, sleeve, or wrap clean and dry.  If the brace, sleeve, or wrap is not waterproof: ? Do not let it get wet. ? Cover it with a watertight covering when you take a bath or shower. Managing pain and swelling   Take over-the-counter and prescription medicines only as told by your health care provider.  If directed, put ice on your knee: ? If you have a removable brace, sleeve, or wrap, remove it as told by your health  care provider. ? Put ice in a plastic bag. ? Place a towel between your skin and the bag. ? Leave the ice on for 20 minutes, 2-3 times per day.  Move your toes often to avoid stiffness and to lessen swelling.  Raise (elevate) the injured area above the level of your heart while you are sitting or lying down. Activity  Do not use the injured limb to support your body weight until your health care provider says that you can. Use crutches or a walker as told by your health care provider.  Return to your normal activities as told by your health care provider. Ask your health care  provider what activities are safe for you.  Perform range-of-motion exercises only as told by your health care provider.  Begin doing exercises to strengthen your knee and leg muscles only as told by your health care provider. After you recover, your health care provider may recommend these exercises to help prevent another injury. General instructions  Use a knee brace, sleeve, or wrap as told by your health care provider.  Ask your health care provider when it is safe to drive if you have a brace, sleeve, or wrap on your knee.  Do not use any products that contain nicotine or tobacco, such as cigarettes, e-cigarettes, and chewing tobacco. If you need help quitting, ask your health care provider.  Ask your health care provider if the medicine prescribed to you: ? Requires you to avoid driving or using heavy machinery. ? Can cause constipation. You may need to take these actions to prevent or treat constipation:  Drink enough fluid to keep your urine pale yellow.  Take over-the-counter or prescription medicines.  Eat foods that are high in fiber, such as beans, whole grains, and fresh fruits and vegetables.  Limit foods that are high in fat and processed sugars, such as fried or sweet foods.  Keep all follow-up visits as told by your health care provider. This is important. Contact a health care provider if:  You have a fever.  Your knee becomes red, tender, or swollen.  Your pain medicine is not helping.  Your symptoms get worse or do not improve after 2 weeks of home care. Summary  A meniscus tear is a knee injury that happens when a piece of the meniscus is torn.  Treatment for this injury depends on the severity of the tear. You may need surgery if you have a severe tear or if other treatments are not working.  Rest, ice, and raise (elevate) your injured knee as told by your health care provider. This will help lessen pain and swelling.  Contact a health care provider  if you have new symptoms, or your symptoms get worse or do not improve after 2 weeks of home care.  Keep all follow-up visits as told by your health care provider. This is important. This information is not intended to replace advice given to you by your health care provider. Make sure you discuss any questions you have with your health care provider. Document Released: 09/04/2002 Document Revised: 12/27/2017 Document Reviewed: 12/27/2017 Elsevier Patient Education  2020 [employer].

## 2018-12-31 ENCOUNTER — Ambulatory Visit (INDEPENDENT_AMBULATORY_CARE_PROVIDER_SITE_OTHER): Payer: 59

## 2018-12-31 ENCOUNTER — Other Ambulatory Visit: Payer: Self-pay

## 2018-12-31 DIAGNOSIS — M25561 Pain in right knee: Secondary | ICD-10-CM

## 2019-01-01 ENCOUNTER — Ambulatory Visit (INDEPENDENT_AMBULATORY_CARE_PROVIDER_SITE_OTHER): Payer: 59 | Admitting: Family Medicine

## 2019-01-01 VITALS — Ht 67.0 in | Wt 250.0 lb

## 2019-01-01 DIAGNOSIS — M25561 Pain in right knee: Secondary | ICD-10-CM

## 2019-01-01 DIAGNOSIS — S83249A Other tear of medial meniscus, current injury, unspecified knee, initial encounter: Secondary | ICD-10-CM

## 2019-01-01 NOTE — Progress Notes (Signed)
Virtual Visit  via Video Note  I connected with      Kristina Martinez by a video enabled telemedicine application and verified that I am speaking with the correct person using two identifiers.   I discussed the limitations of evaluation and management by telemedicine and the availability of in person appointments. The patient expressed understanding and agreed to proceed.  History of Present Illness: Kristina Martinez is a 48 y.o. female who would like to discuss follow-up knee MRI results.  Patient was seen in early June and subsequently in July for knee pain.  She had knee effusion and pain after an injury and have some concern for meniscus injury.  She had failed steroid injection.  MRI was obtained which showed focal chondromalacia of the patellofemoral joint as well as the medial femoral condyle.  Additionally she did have a small medial meniscus tear.  She has continued pain.  She notes some locking and catching.  She denies any giving way.  No fevers or chills.  Additionally in June aspiration of her knee did not show crystals with a relatively normal white blood cell count.  She did have MRSA on culture however this was thought to be contamination after consultation with ID.   Observations/Objective: Ht 5\' 7"  (1.702 m)   Wt 250 lb (113.4 kg)   BMI 39.16 kg/m  Wt Readings from Last 5 Encounters:  01/01/19 250 lb (113.4 kg)  12/28/18 250 lb (113.4 kg)  12/01/18 248 lb (112.5 kg)  07/19/18 246 lb (111.6 kg)  04/27/18 242 lb 12.8 oz (110.1 kg)   Exam: Appearance nontoxic no acute distress Normal Speech.    Lab and Radiology Results No results found for this or any previous visit (from the past 72 hour(s)). Mr Knee Right Wo Contrast  Result Date: 01/01/2019 CLINICAL DATA:  Right knee pain for 1 month. Painful range of motion. EXAM: MRI OF THE RIGHT KNEE WITHOUT CONTRAST TECHNIQUE: Multiplanar, multisequence MR imaging of the knee was performed. No intravenous contrast was  administered. COMPARISON:  Radiographs dated 12/01/2018 FINDINGS: MENISCI Medial meniscus: There is a focal small free edge longitudinal tear of the midbody of the medial meniscus seen on series 9. Lateral meniscus:  Normal. LIGAMENTS Cruciates:  Normal. Collaterals:  Normal. CARTILAGE Patellofemoral: Extensive denuding of the articular cartilage of the patella. Subcortical edema at the apex and lateral facet of the patella. Medial: Thinning of the articular cartilage of the central portions of the tibial plateau and medial femoral condyle. Lateral:  Normal. Joint: Moderate joint effusion. Slightly thickened suprapatellar plica. Normal Hoffa's fat pad. Popliteal Fossa: Intact popliteus tendon. No Baker's cyst. There is fluid around the muscles of the proximal calf, likely extravasated joint fluid. Extensor Mechanism:  Normal. Bones:  Minimal tricompartmental marginal osteophytes. Other: None IMPRESSION: 1. Extensive cartilage loss of the patella. 2. Focal cartilage thinning in the medial compartment. 3. Small free edge longitudinal tear of the midbody of the medial meniscus. 4. Moderate joint effusion with thickened suprapatellar plica. Electronically Signed   By: Lorriane Shire M.D.   On: 01/01/2019 05:11   I personally (independently) visualized and performed the interpretation of the images attached in this note.   Assessment and Plan: 48 y.o. female with right knee pain.  Continued.  Likely due to meniscus tear.  Additionally patient does have some focal areas of cartilage loss at the medial femoral condyle and patellofemoral joint.  We discussed options.  Given her failure to improve with typical conservative  management plan to refer to orthopedic surgery.  She may benefit from arthroscopic medial meniscus debridement.  We also discussed the possibility of microfracture or oats procedure for focal cartilage loss.  She notes that she is self-employed and really needs to be able to walk and does not think  that she can stand 6 weeks of nonweightbearing for this procedures.  Recheck with me as needed.  PDMP not reviewed this encounter. Orders Placed This Encounter  Procedures  . Ambulatory referral to Orthopedic Surgery    Referral Priority:   Routine    Referral Type:   Surgical    Referral Reason:   Specialty Services Required    Requested Specialty:   Orthopedic Surgery    Number of Visits Requested:   1   No orders of the defined types were placed in this encounter.   Follow Up Instructions:    I discussed the assessment and treatment plan with the patient. The patient was provided an opportunity to ask questions and all were answered. The patient agreed with the plan and demonstrated an understanding of the instructions.   The patient was advised to call back or seek an in-person evaluation if the symptoms worsen or if the condition fails to improve as anticipated.  Time: 15 minutes of intraservice time, with >22 minutes of total time during today's visit.      Historical information moved to improve visibility of documentation.  Past Medical History:  Diagnosis Date  . Achilles tendon injury   . ADD (attention deficit disorder)   . Depression   . IBS (irritable bowel syndrome)   . Migraine   . Obese   . Ovarian cyst   . PMDD (premenstrual dysphoric disorder)   . Vaso-vagal reaction    Past Surgical History:  Procedure Laterality Date  . LAPAROSCOPIC CHOLECYSTECTOMY  2011   Social History   Tobacco Use  . Smoking status: Never Smoker  . Smokeless tobacco: Never Used  Substance Use Topics  . Alcohol use: Yes    Comment: 1/ in 6 months   family history includes Breast cancer in her maternal grandmother and paternal grandmother; Heart disease in her father; Heart failure in her father; Prostate cancer in her father; Skin cancer in her mother; Stroke (age of onset: 64) in her brother; Uterine cancer in her maternal grandmother.  Medications: Current Outpatient  Medications  Medication Sig Dispense Refill  . ALPRAZolam (XANAX) 0.5 MG tablet TAKE 1/2 TO 1 TABLET BY MOUTH DAILY AS NEEDED 15 tablet 3  . citalopram (CELEXA) 20 MG tablet Take 1 tablet (20 mg total) by mouth daily. [place].MUST SCHEDULE/KEEP APPOINTMENT FOR REFILLS 30 tablet 0  . doxepin (SINEQUAN) 25 MG capsule Take by mouth.    . fluticasone (FLOVENT HFA) 110 MCG/ACT inhaler Inhale 2 puffs into the lungs 2 (two) times daily. For cough 1 Inhaler 0  . levonorgestrel-ethinyl estradiol (SEASONALE,INTROVALE,JOLESSA) 0.15-0.03 MG tablet TAKE 1 TABLET BY MOUTH  DAILY 91 tablet 4  . methylphenidate (RITALIN) 10 MG tablet Take 1 tablet (10 mg total) by mouth 2 (two) times daily with breakfast and lunch. 60 tablet 0  . methylphenidate (RITALIN) 10 MG tablet Take 1 tablet (10 mg total) by mouth 2 (two) times daily. 60 tablet 0  . methylphenidate (RITALIN) 10 MG tablet Take 1 tablet (10 mg total) by mouth 2 (two) times daily. 60 tablet 0   No current facility-administered medications for this visit.    Allergies  Allergen Reactions  .  Ciprofloxacin Hives  . Zofran [Ondansetron Hcl]     Migraine

## 2019-01-01 NOTE — Patient Instructions (Signed)
Thank you for coming in today.  You should hear about surgery soon.  Let me know if you do not hear anything.  Keep me updated.   We will go from here.

## 2019-01-01 NOTE — Progress Notes (Signed)
Wants to discuss MR knee results.

## 2019-01-02 ENCOUNTER — Telehealth: Payer: Self-pay | Admitting: Neurology

## 2019-01-02 NOTE — Telephone Encounter (Signed)
Patient left vm asking for films for MR/XRay to be burned onto a disc, she would like to see an orthopaedic surgeon she knows personally. I called [facility] and they are working on burning disc, will call patient directly when ready. Patient made aware.

## 2019-01-16 ENCOUNTER — Other Ambulatory Visit: Payer: Self-pay | Admitting: Family Medicine

## 2019-01-16 NOTE — Telephone Encounter (Signed)
Patinet will call back for a follow up with PCP.

## 2019-02-21 ENCOUNTER — Other Ambulatory Visit: Payer: Self-pay | Admitting: Family Medicine

## 2019-03-21 ENCOUNTER — Other Ambulatory Visit: Payer: Self-pay | Admitting: Family Medicine

## 2019-04-27 ENCOUNTER — Other Ambulatory Visit: Payer: Self-pay | Admitting: *Deleted

## 2019-04-27 MED ORDER — CITALOPRAM HYDROBROMIDE 20 MG PO TABS: 20.0000 mg | ORAL_TABLET | Freq: Every day | ORAL | 0 refills | Status: DC

## 2019-06-04 ENCOUNTER — Telehealth: Payer: Self-pay | Admitting: Family Medicine

## 2019-06-04 MED ORDER — CITALOPRAM HYDROBROMIDE 20 MG PO TABS: 20.0000 mg | ORAL_TABLET | Freq: Every day | ORAL | 0 refills | Status: DC

## 2019-06-04 NOTE — Telephone Encounter (Signed)
Pt scheduled Virtual Follow up with Dr.Metheney but is already completely out of her Citalopram and can you call her in some at least to get her by to her appt next week? Call patient and let her know

## 2019-06-04 NOTE — Telephone Encounter (Signed)
Pt advised that medication has been sent to her local pharmacy.Maryruth Eve, [place]ahoma Crocker, CMA

## 2019-06-12 ENCOUNTER — Other Ambulatory Visit: Payer: Self-pay | Admitting: Family Medicine

## 2019-06-12 ENCOUNTER — Ambulatory Visit (INDEPENDENT_AMBULATORY_CARE_PROVIDER_SITE_OTHER): Payer: 59 | Admitting: Family Medicine

## 2019-06-12 ENCOUNTER — Encounter: Payer: Self-pay | Admitting: Family Medicine

## 2019-06-12 VITALS — Wt 250.0 lb

## 2019-06-12 DIAGNOSIS — E785 Hyperlipidemia, unspecified: Secondary | ICD-10-CM

## 2019-06-12 DIAGNOSIS — Z6838 Body mass index (BMI) 38.0-38.9, adult: Secondary | ICD-10-CM | POA: Insufficient documentation

## 2019-06-12 DIAGNOSIS — D509 Iron deficiency anemia, unspecified: Secondary | ICD-10-CM

## 2019-06-12 DIAGNOSIS — D500 Iron deficiency anemia secondary to blood loss (chronic): Secondary | ICD-10-CM

## 2019-06-12 DIAGNOSIS — F988 Other specified behavioral and emotional disorders with onset usually occurring in childhood and adolescence: Secondary | ICD-10-CM | POA: Diagnosis not present

## 2019-06-12 DIAGNOSIS — R748 Abnormal levels of other serum enzymes: Secondary | ICD-10-CM

## 2019-06-12 DIAGNOSIS — E6609 Other obesity due to excess calories: Secondary | ICD-10-CM | POA: Insufficient documentation

## 2019-06-12 DIAGNOSIS — R7309 Other abnormal glucose: Secondary | ICD-10-CM

## 2019-06-12 DIAGNOSIS — F331 Major depressive disorder, recurrent, moderate: Secondary | ICD-10-CM | POA: Diagnosis not present

## 2019-06-12 MED ORDER — ONDANSETRON 8 MG PO TBDP: 8.0000 mg | ORAL_TABLET | Freq: Three times a day (TID) | ORAL | 0 refills | Status: DC | PRN

## 2019-06-12 MED ORDER — CITALOPRAM HYDROBROMIDE 20 MG PO TABS: 20.0000 mg | ORAL_TABLET | Freq: Every day | ORAL | 1 refills | Status: DC

## 2019-06-12 NOTE — Assessment & Plan Note (Signed)
Due to recheck lipids. 

## 2019-06-12 NOTE — Assessment & Plan Note (Signed)
Following with heme/onc every 3-6 months.

## 2019-06-12 NOTE — Progress Notes (Signed)
Advised about scheduling a drive by to have Tdap done.

## 2019-06-12 NOTE — Assessment & Plan Note (Signed)
Due to recheck enzymes 

## 2019-06-12 NOTE — Assessment & Plan Note (Signed)
Well controlled. Continue current regimen. Follow up in  4 months.   

## 2019-06-12 NOTE — Progress Notes (Signed)
Virtual Visit via Telephone Note  I connected with Kristina Martinez on 06/13/19 at  8:50 AM EST by telephone and verified that I am speaking with the correct person using two identifiers.   I discussed the limitations, risks, security and privacy concerns of performing an evaluation and management service by telephone and the availability of in person appointments. I also discussed with the patient that there may be a patient responsible charge related to this service. The patient expressed understanding and agreed to proceed.    Established Patient Office Visit  Subjective:  Patient ID: Kristina Martinez, female    DOB: [birth year]  Age: 48 y.o. MRN: [id number]  CC:  Chief Complaint  Patient presents with  . ADD    HPI Kristina Martinez presents for   Iron deficiency anemia due to chronic blood loss-currently following with hematology oncology.  Last seen on December 1.  Her hemoglobin is now up to 13.3.  They are following labs every 3 to 6 months.  Follow-up ADD-she has not been on medication in quite some time. Hasn't been taking it as much.    F/U depression - she is thinking about tapering off her medication after the Holidays. She is otherwise happy with her regimen. Uses her xanax sparingly.    Hyperlipidemia - tolerating stating well with no myalgias or significant side effects.  Lab Results  Component Value Date   CHOL 235 (H) 03/04/2015   CHOL 213 (H) 04/16/2013   CHOL 227 (H) 01/24/2012   Lab Results  Component Value Date   HDL 42 (L) 03/04/2015   HDL 27 (L) 04/16/2013   HDL 42 01/24/2012   Lab Results  Component Value Date   LDLCALC 148 (H) 03/04/2015   LDLCALC 153 (H) 04/16/2013   LDLCALC 147 (H) 01/24/2012   Lab Results  Component Value Date   TRIG 224 (H) 03/04/2015   TRIG 167 (H) 04/16/2013   TRIG 189 (H) 01/24/2012   Lab Results  Component Value Date   CHOLHDL 5.6 (H) 03/04/2015   CHOLHDL 7.9 04/16/2013   CHOLHDL 5.4 01/24/2012   No results found  for: LDLDIRECT   Past Medical History:  Diagnosis Date  . Achilles tendon injury   . ADD (attention deficit disorder)   . Depression   . IBS (irritable bowel syndrome)   . Migraine   . Obese   . Ovarian cyst   . PMDD (premenstrual dysphoric disorder)   . Vaso-vagal reaction     Past Surgical History:  Procedure Laterality Date  . LAPAROSCOPIC CHOLECYSTECTOMY  2011    Family History  Problem Relation Age of Onset  . Skin cancer Mother        skin  . Prostate cancer Father   . Heart disease Father        AMI  . Heart failure Father   . Stroke Brother 40  . Breast cancer Maternal Grandmother   . Uterine cancer Maternal Grandmother        ? cervix  . Breast cancer Paternal Grandmother     Social History   Socioeconomic History  . Marital status: Married    Spouse name: Not on file  . Number of children: Not on file  . Years of education: Not on file  . Highest education level: Not on file  Occupational History  . Not on file  Tobacco Use  . Smoking status: Never Smoker  . Smokeless tobacco: Never Used  Substance and Sexual Activity  .  Alcohol use: Yes    Comment: 1/ in 6 months  . Drug use: No  . Sexual activity: Not on file  Other Topics Concern  . Not on file  Social History Narrative  . Not on file   Social Determinants of Health   Financial Resource Strain:   . Difficulty of Paying Living Expenses: Not on file  Food Insecurity:   . Worried About Charity fundraiser in the Last Year: Not on file  . Ran Out of Food in the Last Year: Not on file  Transportation Needs:   . Lack of Transportation (Medical): Not on file  . Lack of Transportation (Non-Medical): Not on file  Physical Activity:   . Days of Exercise per Week: Not on file  . Minutes of Exercise per Session: Not on file  Stress:   . Feeling of Stress : Not on file  Social Connections:   . Frequency of Communication with Friends and Family: Not on file  . Frequency of Social Gatherings  with Friends and Family: Not on file  . Attends Religious Services: Not on file  . Active Member of Clubs or Organizations: Not on file  . Attends Archivist Meetings: Not on file  . Marital Status: Not on file  Intimate Partner Violence:   . Fear of Current or Ex-Partner: Not on file  . Emotionally Abused: Not on file  . Physically Abused: Not on file  . Sexually Abused: Not on file    Outpatient Medications Prior to Visit  Medication Sig Dispense Refill  . gabapentin (NEURONTIN) 300 MG capsule Take 1 capsule by mouth at bedtime.    . methylphenidate (RITALIN) 10 MG tablet Take 1 tablet (10 mg total) by mouth 2 (two) times daily with breakfast and lunch. 60 tablet 0  . methylphenidate (RITALIN) 10 MG tablet Take 1 tablet (10 mg total) by mouth 2 (two) times daily. 60 tablet 0  . citalopram (CELEXA) 20 MG tablet Take 1 tablet (20 mg total) by mouth daily. 30 tablet 0  . levonorgestrel-ethinyl estradiol (SEASONALE,INTROVALE,JOLESSA) 0.15-0.03 MG tablet TAKE 1 TABLET BY MOUTH  DAILY 91 tablet 4  . ALPRAZolam (XANAX) 0.5 MG tablet TAKE 1/2 TO 1 TABLET BY MOUTH DAILY AS NEEDED 15 tablet 3  . doxepin (SINEQUAN) 25 MG capsule Take by mouth.    . fluticasone (FLOVENT HFA) 110 MCG/ACT inhaler Inhale 2 puffs into the lungs 2 (two) times daily. For cough 1 Inhaler 0  . methylphenidate (RITALIN) 10 MG tablet Take 1 tablet (10 mg total) by mouth 2 (two) times daily. 60 tablet 0   No facility-administered medications prior to visit.    Allergies  Allergen Reactions  . Ciprofloxacin Hives    ROS Review of Systems    Objective:    Physical Exam  Wt 250 lb (113.4 kg)   LMP 05/09/2019 (Approximate)   BMI 39.16 kg/m  Wt Readings from Last 3 Encounters:  06/12/19 250 lb (113.4 kg)  01/01/19 250 lb (113.4 kg)  12/28/18 250 lb (113.4 kg)     Health Maintenance Due  Topic Date Due  . PAP SMEAR-Modifier  01/27/2018    There are no preventive care reminders to display for  this patient.  Lab Results  Component Value Date   TSH 1.163 03/30/2012   Lab Results  Component Value Date   WBC 8.2 02/22/2017   HGB 10.2 (L) 02/22/2017   HCT 33.6 (L) 02/22/2017   MCV 70.6 (L) 02/22/2017   PLT  409 (H) 02/22/2017   Lab Results  Component Value Date   NA 136 02/22/2017   K 3.9 02/22/2017   CO2 20 02/22/2017   GLUCOSE 159 (H) 02/22/2017   BUN 9 02/22/2017   CREATININE 0.67 02/22/2017   BILITOT 0.2 02/22/2017   ALKPHOS 106 02/22/2017   AST 13 02/22/2017   ALT 10 02/22/2017   PROT 6.4 02/22/2017   ALBUMIN 3.6 02/22/2017   CALCIUM 8.6 02/22/2017   Lab Results  Component Value Date   CHOL 235 (H) 03/04/2015   Lab Results  Component Value Date   HDL 42 (L) 03/04/2015   Lab Results  Component Value Date   LDLCALC 148 (H) 03/04/2015   Lab Results  Component Value Date   TRIG 224 (H) 03/04/2015   Lab Results  Component Value Date   CHOLHDL 5.6 (H) 03/04/2015   Lab Results  Component Value Date   HGBA1C 5.6 04/23/2011      Assessment & Plan:   Problem List Items Addressed This Visit      Other   Major depressive disorder, recurrent episode, moderate ([place])    Well controlled. Would possible want to taper after Holidays. RF sent for now.  F/U in 6 months.        Relevant Medications   ALPRAZolam (XANAX) 0.5 MG tablet   citalopram (CELEXA) 20 MG tablet   Iron deficiency anemia due to chronic blood loss    Following with heme/onc every 3-6 months.        HLD (hyperlipidemia) - Primary    Due to recheck lipids.       Relevant Orders   COMPLETE METABOLIC PANEL WITH GFR   Lipid panel   Attention deficit disorder    Well controlled. Continue current regimen. Follow up in  4 months.       Relevant Medications   methylphenidate (RITALIN) 10 MG tablet   Abnormal levels of other serum enzymes    Due to recheck enzymes      Relevant Orders   COMPLETE METABOLIC PANEL WITH GFR   Lipid panel    Other Visit Diagnoses    Abnormal  glucose       Relevant Orders   Hemoglobin A1c   Microcytic anemia          Meds ordered this encounter  Medications  . ALPRAZolam (XANAX) 0.5 MG tablet    Sig: TAKE 1/2 TO 1 TABLET BY MOUTH DAILY AS NEEDED    Dispense:  15 tablet    Refill:  3  . methylphenidate (RITALIN) 10 MG tablet    Sig: Take 1 tablet (10 mg total) by mouth 2 (two) times daily.    Dispense:  180 tablet    Refill:  0  . ondansetron (ZOFRAN ODT) 8 MG disintegrating tablet    Sig: Take 1 tablet (8 mg total) by mouth every 8 (eight) hours as needed for nausea.    Dispense:  20 tablet    Refill:  0  . citalopram (CELEXA) 20 MG tablet    Sig: Take 1 tablet (20 mg total) by mouth daily.    Dispense:  90 tablet    Refill:  1    Follow-up: No follow-ups on file.      I discussed the assessment and treatment plan with the patient. The patient was provided an opportunity to ask questions and all were answered. The patient agreed with the plan and demonstrated an understanding of the instructions.   The patient was  advised to call back or seek an in-person evaluation if the symptoms worsen or if the condition fails to improve as anticipated.  I provided 25 minutes of non-face-to-face time during this encounter.    Beatrice Lecher, MD

## 2019-06-12 NOTE — Assessment & Plan Note (Signed)
Well controlled. Would possible want to taper after Holidays. RF sent for now.  F/U in 6 months.

## 2019-06-13 ENCOUNTER — Encounter: Payer: Self-pay | Admitting: Family Medicine

## 2019-06-13 MED ORDER — METHYLPHENIDATE HCL 10 MG PO TABS: 10.0000 mg | ORAL_TABLET | Freq: Two times a day (BID) | ORAL | 0 refills | Status: DC

## 2019-06-13 MED ORDER — ALPRAZOLAM 0.5 MG PO TABS: ORAL_TABLET | ORAL | 3 refills | Status: DC

## 2019-06-26 DIAGNOSIS — M4310 Spondylolisthesis, site unspecified: Secondary | ICD-10-CM | POA: Insufficient documentation

## 2019-06-26 DIAGNOSIS — M4300 Spondylolysis, site unspecified: Secondary | ICD-10-CM | POA: Insufficient documentation

## 2019-06-26 DIAGNOSIS — M5416 Radiculopathy, lumbar region: Secondary | ICD-10-CM | POA: Insufficient documentation

## 2019-07-18 ENCOUNTER — Other Ambulatory Visit: Payer: Self-pay | Admitting: *Deleted

## 2019-11-14 ENCOUNTER — Other Ambulatory Visit: Payer: Self-pay | Admitting: Family Medicine

## 2019-12-19 ENCOUNTER — Other Ambulatory Visit: Payer: Self-pay

## 2019-12-19 ENCOUNTER — Encounter: Payer: Self-pay | Admitting: Emergency Medicine

## 2019-12-19 ENCOUNTER — Emergency Department (INDEPENDENT_AMBULATORY_CARE_PROVIDER_SITE_OTHER)
Admission: EM | Admit: 2019-12-19 | Discharge: 2019-12-19 | Disposition: A | Discharge status: Home / Self Care | Payer: 59 | Source: Home / Self Care | Attending: Family Medicine | Admitting: Family Medicine

## 2019-12-19 DIAGNOSIS — B084 Enteroviral vesicular stomatitis with exanthem: Secondary | ICD-10-CM

## 2019-12-19 DIAGNOSIS — J029 Acute pharyngitis, unspecified: Secondary | ICD-10-CM

## 2019-12-19 MED ORDER — ONDANSETRON 4 MG PO TBDP
4.0000 mg | ORAL_TABLET | Freq: Once | ORAL | Status: AC
  Administered 2019-12-19: 4 mg via ORAL

## 2019-12-19 NOTE — ED Triage Notes (Signed)
C/o chills, sore throat since Saturday Pt does not own a thermometer- felt like she had a fever  Ears feel full Rash to hands & feet started last night  Nausea today - zofran given in triage  Ibuprofen last night  No OTC meds today Rapid COVID negative at [facility] yesterday  [place] April 2021

## 2019-12-19 NOTE — ED Provider Notes (Signed)
[facility]    CSN: [id number] Arrival date & time: 12/19/19  1317      History   Chief Complaint Chief Complaint  Patient presents with  . Sore Throat  . Rash    HPI Kristina Martinez is a 49 y.o. female.   Patient complains of onset of sore throat, fever and myalgias four days ago.  Two days ago she developed a burning rash on her fingers/hands.  Today she noticed a rash on her feet.  Her ears feel full but she denies URI symptoms.  She has had nausea without vomiting.  The history is provided by the patient.    Past Medical History:  Diagnosis Date  . Achilles tendon injury   . ADD (attention deficit disorder)   . Depression   . IBS (irritable bowel syndrome)   . Migraine   . Obese   . Ovarian cyst   . PMDD (premenstrual dysphoric disorder)   . Vaso-vagal reaction     Patient Active Problem List   Diagnosis Date Noted  . Lumbar radiculopathy 06/26/2019  . Pars defect with spondylolisthesis 06/26/2019  . Class 2 obesity due to excess calories without serious comorbidity with body mass index (BMI) of 38.0 to 38.9 in adult 06/12/2019  . History of nephrolithiasis 01/23/2018  . Reactive thrombocytosis 09/28/2017  . Gastroesophageal reflux disease without esophagitis 09/08/2017  . Irritable bowel syndrome with diarrhea 09/08/2017  . Eosinophil count raised 08/17/2017  . Iron deficiency anemia due to chronic blood loss 08/17/2017  . Dysfunction of left rotator cuff 09/29/2016  . Pulmonary nodule, right 06/02/2016  . Acute right flank pain 04/23/2016  . Chronic anemia 04/23/2016  . Hydronephrosis of right kidney 04/23/2016  . Primary osteoarthritis of left knee 01/09/2015  . Rosacea 11/01/2013  . Neuropathy of right ulnar nerve at wrist 06/05/2013  . HLD (hyperlipidemia) 04/16/2013  . Plantar fasciitis, bilateral 12/08/2012  . Anxiety 03/10/2012  . Major depressive disorder, recurrent episode, moderate ([place]) 07/14/2011  . Cervical radiculopathy  01/23/2011  . CARDIAC MURMUR 07/01/2010  . NONSPECIFIC MESENTERIC LYMPHADENITIS 01/29/2010  . NIGHT SWEATS 01/28/2010  . BACK PAIN 01/12/2010  . HEADACHE 09/16/2009  . [place] DISEASE, LUMBAR 06/24/2009  . SINUSITIS, CHRONIC 06/04/2009  . Abnormal levels of other serum enzymes 03/19/2009  . Diaphragmatic hernia 12/26/2008  . Other and unspecified ovarian cyst 12/17/2008  . Attention deficit disorder 10/31/2007    Past Surgical History:  Procedure Laterality Date  . LAPAROSCOPIC CHOLECYSTECTOMY  2011    OB History   No obstetric history on file.      Home Medications    Prior to Admission medications   Medication Sig Start Date End Date Taking? Authorizing Provider  ALPRAZolam Duanne Moron) 0.5 MG tablet TAKE 1/2 TO 1 TABLET BY MOUTH DAILY AS NEEDED 06/13/19  Yes Hali Marry, MD  citalopram (CELEXA) 20 MG tablet TAKE 1 TABLET BY MOUTH  DAILY 11/15/19  Yes Hali Marry, MD  levonorgestrel-ethinyl estradiol (SEASONALE) 0.15-0.03 MG tablet TAKE 1 TABLET BY MOUTH  DAILY 06/13/19  Yes Hali Marry, MD  methylphenidate (RITALIN) 10 MG tablet Take 1 tablet (10 mg total) by mouth 2 (two) times daily with breakfast and lunch. 05/21/18  Yes Hali Marry, MD  methylphenidate (RITALIN) 10 MG tablet Take 1 tablet (10 mg total) by mouth 2 (two) times daily. 06/13/19  Yes Hali Marry, MD  omeprazole (PRILOSEC) 40 MG capsule Take by mouth. 08/08/17  Yes [provider]  ondansetron [facility]  ODT) 8 MG disintegrating tablet Take 1 tablet (8 mg total) by mouth every 8 (eight) hours as needed for nausea. 06/12/19  Yes Hali Marry, MD  gabapentin (NEURONTIN) 300 MG capsule Take 1 capsule by mouth at bedtime. 06/08/19   [provider]    Family History Family History  Problem Relation Age of Onset  . Skin cancer Mother        skin  . Prostate cancer Father   . Heart disease Father        AMI  . Heart failure Father   . Stroke  Brother 40  . Breast cancer Maternal Grandmother   . Uterine cancer Maternal Grandmother        ? cervix  . Breast cancer Paternal Grandmother     Social History Social History   Tobacco Use  . Smoking status: Never Smoker  . Smokeless tobacco: Never Used  Substance Use Topics  . Alcohol use: Yes    Comment: 1/ in 6 months  . Drug use: No     Allergies   Ciprofloxacin   Review of Systems Review of Systems + sore throat No cough No pleuritic pain No wheezing No nasal congestion No post-nasal drainage No sinus pain/pressure No itchy/red eyes ? earache No hemoptysis No SOB + fever, + chills + nausea No vomiting No abdominal pain No diarrhea No urinary symptoms + skin rash + fatigue + myalgias No headache Used OTC meds (ibuprofen) without relief   Physical Exam Triage Vital Signs ED Triage Vitals [12/19/19 1320]  Enc Vitals Group     BP (!) 150/97     Pulse Rate 84     Resp 15     Temp 98.4 F (36.9 C)     Temp Source Oral     SpO2 98 %     Weight      Height      Head Circumference      Peak Flow      Pain Score      Pain Loc      Pain Edu?      Excl. in [place]?    No data found.  Updated Vital Signs BP (!) 150/97   Pulse 84   Temp 98.4 F (36.9 C) (Oral)   Resp 15   SpO2 98%   Visual Acuity Right Eye Distance:   Left Eye Distance:   Bilateral Distance:    Right Eye Near:   Left Eye Near:    Bilateral Near:     Physical Exam Vitals and nursing note reviewed.  Constitutional:      General: She is not in acute distress. HENT:     Head: Normocephalic.     Right Ear: Tympanic membrane, ear canal and external ear normal.     Left Ear: Tympanic membrane, ear canal and external ear normal.     Nose: Nose normal.     Mouth/Throat:     Mouth: Mucous membranes are moist.      Comments: Mild erythema pharynx.  Enanthem in mouth consists of scattered superficial ulcers with greyish-yellow base and an erythematous rim 1 to 43mm  diameter.  Eyes:     Extraocular Movements: Extraocular movements intact.     Conjunctiva/sclera: Conjunctivae normal.     Pupils: Pupils are equal, round, and reactive to light.  Cardiovascular:     Rate and Rhythm: Regular rhythm.     Heart sounds: Normal heart sounds.  Pulmonary:     Breath  sounds: Normal breath sounds.  Abdominal:     Tenderness: There is no abdominal tenderness.  Musculoskeletal:       Hands:     Right lower leg: No edema.     Left lower leg: No edema.     Comments: There is an exanthem present consisting of small maculo-papular erythematous lesions about 36mm diameter.  Several lesions on the dorsa of hands and feet are slightly vesicular in appearance.   Skin:    General: Skin is warm and dry.  Neurological:     Mental Status: She is alert and oriented to person, place, and time.      UC Treatments / Results  Labs (all labs ordered are listed, but only abnormal results are displayed) Labs Reviewed  POCT RAPID STREP A (OFFICE) negative    EKG   Radiology No results found.  Procedures Procedures (including critical care time)  Medications Ordered in UC Medications  ondansetron (ZOFRAN-ODT) disintegrating tablet 4 mg (4 mg Oral Given 12/19/19 1326)    Initial Impression / Assessment and Plan / UC Course  I have reviewed the triage vital signs and the nursing notes.  Pertinent labs & imaging results that were available during my care of the patient were reviewed by me and considered in my medical decision making (see chart for details).    Administered Zofran ODT 4mg  PO  There is no evidence of bacterial infection today.  Treat symptomatically for now  Followup with Family Doctor if not improved in one week.    Final Clinical Impressions(s) / UC Diagnoses   Final diagnoses:  Hand, foot and mouth disease  Acute pharyngitis, unspecified etiology     Discharge Instructions     Try warm salt water gargles for sore mouth and throat.    May take Ibuprofen 200mg , 4 tabs every 8 hours with food.     ED Prescriptions    None        Kandra Nicolas, MD 12/23/19 1119

## 2019-12-19 NOTE — Discharge Instructions (Addendum)
Try warm salt water gargles for sore mouth and throat.  May take Ibuprofen 200mg , 4 tabs every 8 hours with food.

## 2019-12-20 LAB — STREP A DNA PROBE: Group A Strep Probe: NOT DETECTED

## 2020-02-06 ENCOUNTER — Other Ambulatory Visit: Payer: Self-pay

## 2020-02-06 ENCOUNTER — Emergency Department (INDEPENDENT_AMBULATORY_CARE_PROVIDER_SITE_OTHER)
Admission: RE | Admit: 2020-02-06 | Discharge: 2020-02-06 | Disposition: A | Discharge status: Home / Self Care | Payer: 59 | Source: Ambulatory Visit | Attending: Family Medicine | Admitting: Family Medicine

## 2020-02-06 VITALS — BP 148/90 | HR 86 | Temp 99.1°F | Resp 16

## 2020-02-06 DIAGNOSIS — N3001 Acute cystitis with hematuria: Secondary | ICD-10-CM

## 2020-02-06 LAB — POCT URINALYSIS DIP (MANUAL ENTRY)
Glucose, UA: NEGATIVE mg/dL
Nitrite, UA: NEGATIVE
Protein Ur, POC: 100 mg/dL — AB
Spec Grav, UA: 1.025 (ref 1.010–1.025)
Urobilinogen, UA: 1 E.U./dL
pH, UA: 5.5 (ref 5.0–8.0)

## 2020-02-06 MED ORDER — NITROFURANTOIN MONOHYD MACRO 100 MG PO CAPS: ORAL_CAPSULE | ORAL | 0 refills | Status: DC

## 2020-02-06 NOTE — ED Triage Notes (Signed)
Patient presents to Urgent Care with complaints of frequent and burning urination since yesterday. Patient reports she thinks she may have a UTI, hx of same.

## 2020-02-06 NOTE — Discharge Instructions (Addendum)
Increase fluid intake. May use non-prescription AZO for about two days, if desired, to decrease urinary discomfort.  If symptoms become significantly worse during the night or over the weekend, proceed to the local emergency room.  

## 2020-02-06 NOTE — ED Provider Notes (Signed)
[facility]    CSN: [id number] Arrival date & time: 02/06/20  1739      History   Chief Complaint Chief Complaint  Patient presents with  . Urinary Frequency    HPI Kristina Martinez is a 49 y.o. female.   Patient developed urinary frequency, hematuria, and dysuria yesterday.  She feels well otherwise.  She has a history of UTI's.  The history is provided by the patient.  Urinary Frequency This is a recurrent problem. The current episode started yesterday. The problem occurs constantly. The problem has been gradually worsening. Pertinent negatives include no chest pain and no abdominal pain. Nothing aggravates the symptoms. Nothing relieves the symptoms. She has tried nothing for the symptoms.    Past Medical History:  Diagnosis Date  . Achilles tendon injury   . ADD (attention deficit disorder)   . Depression   . IBS (irritable bowel syndrome)   . Migraine   . Obese   . Ovarian cyst   . PMDD (premenstrual dysphoric disorder)   . Vaso-vagal reaction     Patient Active Problem List   Diagnosis Date Noted  . Lumbar radiculopathy 06/26/2019  . Pars defect with spondylolisthesis 06/26/2019  . Class 2 obesity due to excess calories without serious comorbidity with body mass index (BMI) of 38.0 to 38.9 in adult 06/12/2019  . History of nephrolithiasis 01/23/2018  . Reactive thrombocytosis 09/28/2017  . Gastroesophageal reflux disease without esophagitis 09/08/2017  . Irritable bowel syndrome with diarrhea 09/08/2017  . Eosinophil count raised 08/17/2017  . Iron deficiency anemia due to chronic blood loss 08/17/2017  . Dysfunction of left rotator cuff 09/29/2016  . Pulmonary nodule, right 06/02/2016  . Acute right flank pain 04/23/2016  . Chronic anemia 04/23/2016  . Hydronephrosis of right kidney 04/23/2016  . Primary osteoarthritis of left knee 01/09/2015  . Rosacea 11/01/2013  . Neuropathy of right ulnar nerve at wrist 06/05/2013  . HLD (hyperlipidemia)  04/16/2013  . Plantar fasciitis, bilateral 12/08/2012  . Anxiety 03/10/2012  . Major depressive disorder, recurrent episode, moderate ([place]) 07/14/2011  . Cervical radiculopathy 01/23/2011  . CARDIAC MURMUR 07/01/2010  . NONSPECIFIC MESENTERIC LYMPHADENITIS 01/29/2010  . NIGHT SWEATS 01/28/2010  . BACK PAIN 01/12/2010  . HEADACHE 09/16/2009  . [place] DISEASE, LUMBAR 06/24/2009  . SINUSITIS, CHRONIC 06/04/2009  . Abnormal levels of other serum enzymes 03/19/2009  . Diaphragmatic hernia 12/26/2008  . Other and unspecified ovarian cyst 12/17/2008  . Attention deficit disorder 10/31/2007    Past Surgical History:  Procedure Laterality Date  . LAPAROSCOPIC CHOLECYSTECTOMY  2011    OB History   No obstetric history on file.      Home Medications    Prior to Admission medications   Medication Sig Start Date End Date Taking? Authorizing Provider  ALPRAZolam Duanne Moron) 0.5 MG tablet TAKE 1/2 TO 1 TABLET BY MOUTH DAILY AS NEEDED 06/13/19   Hali Marry, MD  citalopram (CELEXA) 20 MG tablet TAKE 1 TABLET BY MOUTH  DAILY 11/15/19   Hali Marry, MD  gabapentin (NEURONTIN) 300 MG capsule Take 1 capsule by mouth at bedtime. 06/08/19   [provider]  levonorgestrel-ethinyl estradiol (SEASONALE) 0.15-0.03 MG tablet TAKE 1 TABLET BY MOUTH  DAILY 06/13/19   Hali Marry, MD  methylphenidate (RITALIN) 10 MG tablet Take 1 tablet (10 mg total) by mouth 2 (two) times daily with breakfast and lunch. 05/21/18   Hali Marry, MD  methylphenidate (RITALIN) 10 MG tablet Take 1 tablet (10  mg total) by mouth 2 (two) times daily. 06/13/19   Hali Marry, MD  nitrofurantoin, macrocrystal-monohydrate, (MACROBID) 100 MG capsule Take one cap PO Q12hr with food. 02/06/20   Kandra Nicolas, MD  omeprazole (PRILOSEC) 40 MG capsule Take by mouth. 08/08/17   [provider]  ondansetron (ZOFRAN ODT) 8 MG disintegrating tablet Take 1 tablet (8 mg total) by  mouth every 8 (eight) hours as needed for nausea. 06/12/19   Hali Marry, MD    Family History Family History  Problem Relation Age of Onset  . Skin cancer Mother        skin  . Prostate cancer Father   . Heart disease Father        AMI  . Heart failure Father   . Stroke Brother 40  . Breast cancer Maternal Grandmother   . Uterine cancer Maternal Grandmother        ? cervix  . Breast cancer Paternal Grandmother     Social History Social History   Tobacco Use  . Smoking status: Never Smoker  . Smokeless tobacco: Never Used  Substance Use Topics  . Alcohol use: Yes    Comment: 1/ in 6 months  . Drug use: No     Allergies   Patient has no known allergies.   Review of Systems Review of Systems  Constitutional: Negative for appetite change, chills, diaphoresis, fatigue and fever.  Cardiovascular: Negative for chest pain.  Gastrointestinal: Negative for abdominal pain.  Genitourinary: Positive for frequency, hematuria and urgency. Negative for flank pain, pelvic pain and vaginal discharge.  All other systems reviewed and are negative.    Physical Exam Triage Vital Signs ED Triage Vitals  Enc Vitals Group     BP 02/06/20 1754 (!) 148/90     Pulse Rate 02/06/20 1754 86     Resp 02/06/20 1754 16     Temp 02/06/20 1754 99.1 F (37.3 C)     Temp Source 02/06/20 1754 Oral     SpO2 02/06/20 1754 98 %     Weight --      Height --      Head Circumference --      Peak Flow --      Pain Score 02/06/20 1752 2     Pain Loc --      Pain Edu? --      Excl. in [place]? --    No data found.  Updated Vital Signs BP (!) 148/90 (BP Location: Right Arm)   Pulse 86   Temp 99.1 F (37.3 C) (Oral)   Resp 16   SpO2 98%   Visual Acuity Right Eye Distance:   Left Eye Distance:   Bilateral Distance:    Right Eye Near:   Left Eye Near:    Bilateral Near:     Physical Exam Nursing notes and Vital Signs reviewed. Appearance:  Patient appears stated age, and in  no acute distress.    Eyes:  Pupils are equal, round, and reactive to light and accomodation.  Extraocular movement is intact.  Conjunctivae are not inflamed   Pharynx:  Normal; moist mucous membranes  Neck:  Supple.  No adenopathy Lungs:  Clear to auscultation.  Breath sounds are equal.  Moving air well. Heart:  Regular rate and rhythm without murmurs, rubs, or gallops.  Abdomen:  Nontender without masses or hepatosplenomegaly.  Bowel sounds are present.  No CVA or flank tenderness.  Extremities:  No edema.  Skin:  No  rash present.     UC Treatments / Results  Labs (all labs ordered are listed, but only abnormal results are displayed) Labs Reviewed  POCT URINALYSIS DIP (MANUAL ENTRY) - Abnormal; Notable for the following components:      Result Value   Clarity, UA turbid (*)    Bilirubin, UA small (*)    Ketones, POC UA small (15) (*)    Blood, UA moderate (*)    Protein Ur, POC =100 (*)    Leukocytes, UA Small (1+) (*)    All other components within normal limits  URINE CULTURE    EKG   Radiology No results found.  Procedures Procedures (including critical care time)  Medications Ordered in UC Medications - No data to display  Initial Impression / Assessment and Plan / UC Course  I have reviewed the triage vital signs and the nursing notes.  Pertinent labs & imaging results that were available during my care of the patient were reviewed by me and considered in my medical decision making (see chart for details).    Begin Macrobid.  Urine culture pending. Followup with Family Doctor if not improved in one week.    Final Clinical Impressions(s) / UC Diagnoses   Final diagnoses:  Acute cystitis with hematuria     Discharge Instructions     Increase fluid intake. May use non-prescription AZO for about two days, if desired, to decrease urinary discomfort.   If symptoms become significantly worse during the night or over the weekend, proceed to the local  emergency room.     ED Prescriptions    Medication Sig Dispense Auth. Provider   nitrofurantoin, macrocrystal-monohydrate, (MACROBID) 100 MG capsule Take one cap PO Q12hr with food. 14 capsule Kandra Nicolas, MD        Kandra Nicolas, MD 02/13/20 [phone]

## 2020-02-08 LAB — URINE CULTURE
MICRO NUMBER:: 10817272
SPECIMEN QUALITY:: ADEQUATE

## 2020-02-12 ENCOUNTER — Other Ambulatory Visit: Payer: Self-pay | Admitting: Family Medicine

## 2020-03-05 IMAGING — DX DG FOOT COMPLETE 3+V*L*
3 series · 3 of 3 positions shown · non-contrast
Comparison: None.

CLINICAL DATA: Left foot pain for several weeks without known
injury.

EXAM:
LEFT FOOT - COMPLETE 3+ VIEW

[foot ap]
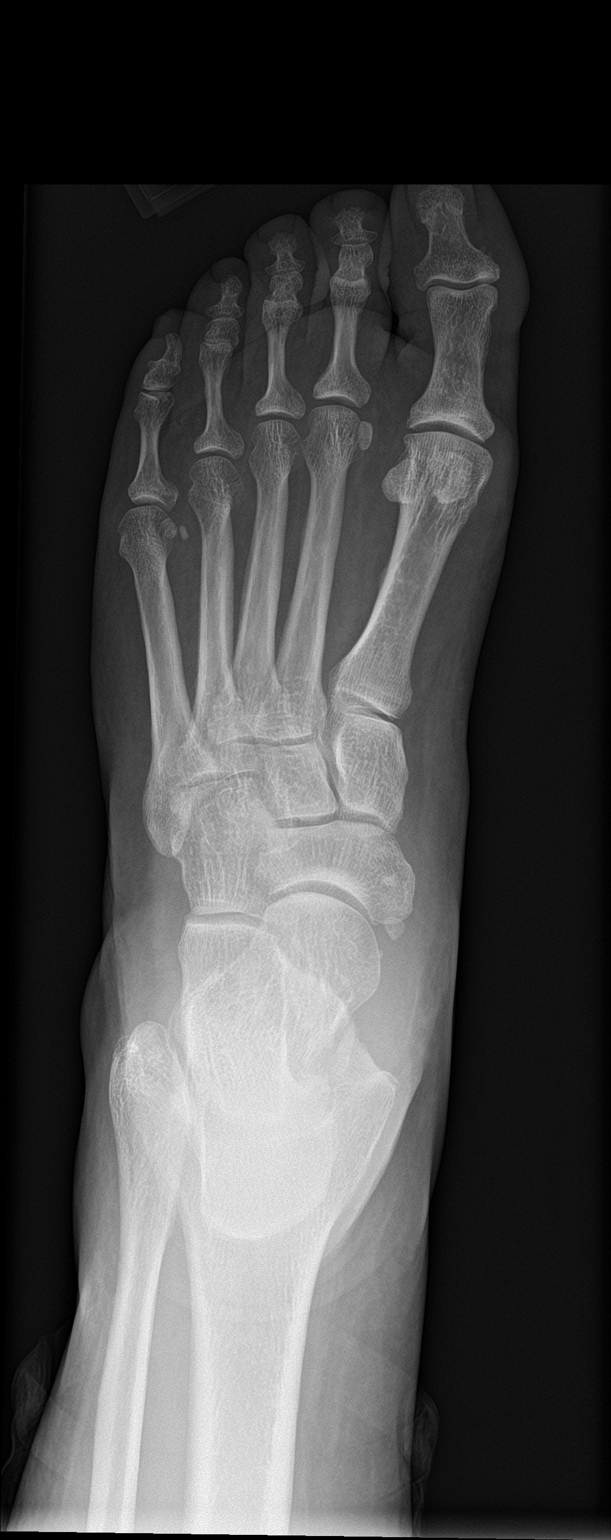

[foot obl]
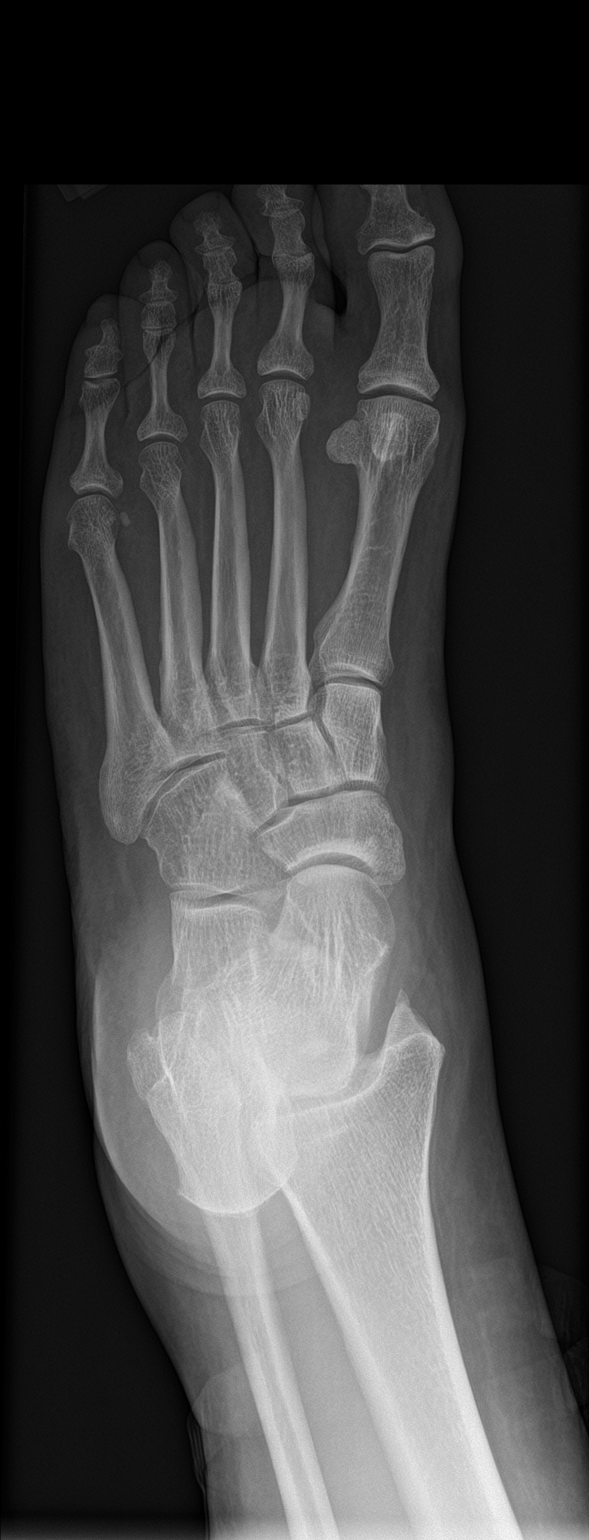

[foot lat]
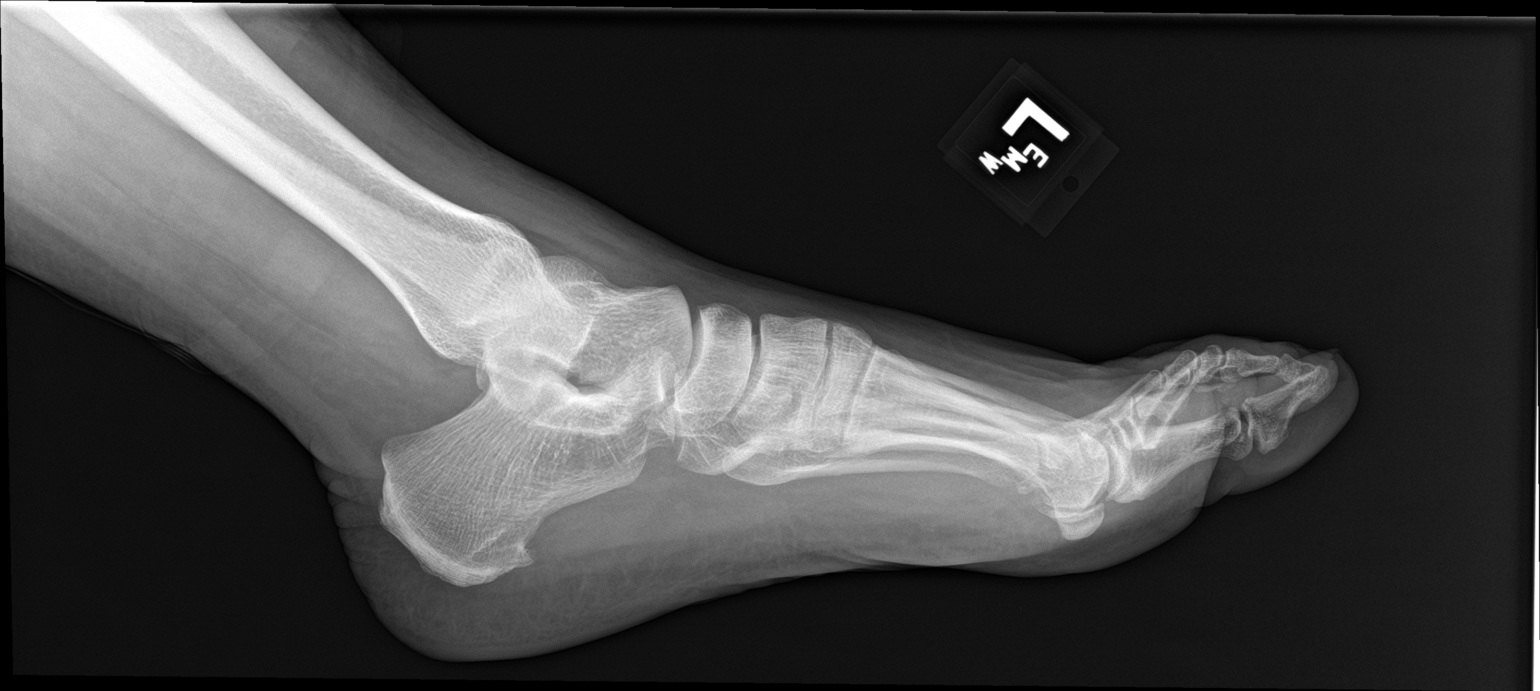

[3 of 3 positions shown; findings below may reference images not displayed]

FINDINGS: There is no evidence of fracture or dislocation. There is no
evidence of arthropathy or other focal bone abnormality. Soft
tissues are unremarkable.
IMPRESSION: Normal left foot.

## 2020-03-10 ENCOUNTER — Other Ambulatory Visit (HOSPITAL_COMMUNITY)
Admission: RE | Admit: 2020-03-10 | Discharge: 2020-03-10 | Disposition: A | Discharge status: Home / Self Care | Payer: 59 | Source: Ambulatory Visit | Attending: Family Medicine | Admitting: Family Medicine

## 2020-03-10 ENCOUNTER — Ambulatory Visit (INDEPENDENT_AMBULATORY_CARE_PROVIDER_SITE_OTHER): Payer: 59 | Admitting: Family Medicine

## 2020-03-10 ENCOUNTER — Encounter: Payer: Self-pay | Admitting: Family Medicine

## 2020-03-10 VITALS — BP 138/78 | HR 100 | Ht 67.0 in | Wt 248.0 lb

## 2020-03-10 DIAGNOSIS — M461 Sacroiliitis, not elsewhere classified: Secondary | ICD-10-CM | POA: Insufficient documentation

## 2020-03-10 DIAGNOSIS — Z0000 Encounter for general adult medical examination without abnormal findings: Secondary | ICD-10-CM

## 2020-03-10 DIAGNOSIS — Z23 Encounter for immunization: Secondary | ICD-10-CM | POA: Diagnosis not present

## 2020-03-10 DIAGNOSIS — F988 Other specified behavioral and emotional disorders with onset usually occurring in childhood and adolescence: Secondary | ICD-10-CM

## 2020-03-10 DIAGNOSIS — M4310 Spondylolisthesis, site unspecified: Secondary | ICD-10-CM

## 2020-03-10 DIAGNOSIS — M4300 Spondylolysis, site unspecified: Secondary | ICD-10-CM | POA: Diagnosis not present

## 2020-03-10 DIAGNOSIS — Z124 Encounter for screening for malignant neoplasm of cervix: Secondary | ICD-10-CM

## 2020-03-10 DIAGNOSIS — F331 Major depressive disorder, recurrent, moderate: Secondary | ICD-10-CM

## 2020-03-10 DIAGNOSIS — Z1159 Encounter for screening for other viral diseases: Secondary | ICD-10-CM

## 2020-03-10 MED ORDER — ALPRAZOLAM 0.5 MG PO TABS: 0.5000 mg | ORAL_TABLET | Freq: Every day | ORAL | 0 refills | Status: DC | PRN

## 2020-03-10 MED ORDER — METHYLPHENIDATE HCL 10 MG PO TABS: 10.0000 mg | ORAL_TABLET | Freq: Two times a day (BID) | ORAL | 0 refills | Status: DC

## 2020-03-10 NOTE — Progress Notes (Signed)
CPE  Established Patient Office Visit  Subjective:  Patient ID: Kristina Martinez, female    DOB: July 24, 1970  Age: 49 y.o. MRN: [id number]  CC:  Chief Complaint  Patient presents with  . Annual Exam    HPI Kristina Martinez presents for CPE.  She is tapering off citalopram.  She is taking it 3 days a week and wants to know how to completely get off of the medication.  Past Medical History:  Diagnosis Date  . Achilles tendon injury   . ADD (attention deficit disorder)   . Depression   . IBS (irritable bowel syndrome)   . Migraine   . Obese   . Ovarian cyst   . PMDD (premenstrual dysphoric disorder)   . Vaso-vagal reaction     Past Surgical History:  Procedure Laterality Date  . LAPAROSCOPIC CHOLECYSTECTOMY  2011    Family History  Problem Relation Age of Onset  . Skin cancer Mother        skin  . Prostate cancer Father   . Heart disease Father        AMI  . Heart failure Father   . Stroke Brother 40  . Breast cancer Maternal Grandmother   . Uterine cancer Maternal Grandmother        ? cervix  . Breast cancer Paternal Grandmother     Social History   Socioeconomic History  . Marital status: Married    Spouse name: Not on file  . Number of children: Not on file  . Years of education: Not on file  . Highest education level: Not on file  Occupational History  . Not on file  Tobacco Use  . Smoking status: Never Smoker  . Smokeless tobacco: Never Used  Substance and Sexual Activity  . Alcohol use: Yes    Comment: 1/ in 6 months  . Drug use: No  . Sexual activity: Not on file  Other Topics Concern  . Not on file  Social History Narrative   [place]s [place].    Social Determinants of Health   Financial Resource Strain:   . Difficulty of Paying Living Expenses: Not on file  Food Insecurity:   . Worried About Charity fundraiser in the Last Year: Not on file  . Ran Out of Food in the Last Year: Not on file  Transportation Needs:   .  Lack of Transportation (Medical): Not on file  . Lack of Transportation (Non-Medical): Not on file  Physical Activity:   . Days of Exercise per Week: Not on file  . Minutes of Exercise per Session: Not on file  Stress:   . Feeling of Stress : Not on file  Social Connections:   . Frequency of Communication with Friends and Family: Not on file  . Frequency of Social Gatherings with Friends and Family: Not on file  . Attends Religious Services: Not on file  . Active Member of Clubs or Organizations: Not on file  . Attends Archivist Meetings: Not on file  . Marital Status: Not on file  Intimate Partner Violence:   . Fear of Current or Ex-Partner: Not on file  . Emotionally Abused: Not on file  . Physically Abused: Not on file  . Sexually Abused: Not on file    Outpatient Medications Prior to Visit  Medication Sig Dispense Refill  . gabapentin (NEURONTIN) 300 MG capsule Take 1 capsule by mouth at bedtime.    Marland Kitchen levonorgestrel-ethinyl estradiol (SEASONALE)  0.15-0.03 MG tablet TAKE 1 TABLET BY MOUTH  DAILY 91 tablet 3  . omeprazole (PRILOSEC) 40 MG capsule Take by mouth.    . ondansetron (ZOFRAN ODT) 8 MG disintegrating tablet Take 1 tablet (8 mg total) by mouth every 8 (eight) hours as needed for nausea. 20 tablet 0  . ALPRAZolam (XANAX) 0.5 MG tablet TAKE 1/2 TO 1 TABLET BY  MOUTH DAILY AS NEEDED 15 tablet 0  . citalopram (CELEXA) 20 MG tablet TAKE 1 TABLET BY MOUTH  DAILY 90 tablet 1  . methylphenidate (RITALIN) 10 MG tablet Take 1 tablet (10 mg total) by mouth 2 (two) times daily with breakfast and lunch. 60 tablet 0  . methylphenidate (RITALIN) 10 MG tablet Take 1 tablet (10 mg total) by mouth 2 (two) times daily. 180 tablet 0  . nitrofurantoin, macrocrystal-monohydrate, (MACROBID) 100 MG capsule Take one cap PO Q12hr with food. 14 capsule 0   No facility-administered medications prior to visit.    No Known Allergies  ROS Review of Systems    Objective:    Physical  Exam Constitutional:      Appearance: She is well-developed.  HENT:     Head: Normocephalic and atraumatic.     Right Ear: Tympanic membrane, ear canal and external ear normal.     Left Ear: Tympanic membrane, ear canal and external ear normal.     Nose: Nose normal.  Eyes:     Conjunctiva/sclera: Conjunctivae normal.     Pupils: Pupils are equal, round, and reactive to light.  Neck:     Thyroid: No thyromegaly.  Cardiovascular:     Rate and Rhythm: Normal rate and regular rhythm.     Heart sounds: Normal heart sounds.  Pulmonary:     Effort: Pulmonary effort is normal.     Breath sounds: Normal breath sounds. No wheezing.  Musculoskeletal:     Cervical back: Neck supple.  Lymphadenopathy:     Cervical: No cervical adenopathy.  Skin:    General: Skin is warm and dry.  Neurological:     Mental Status: She is alert and oriented to person, place, and time.     BP 138/78   Pulse 100   Ht 5\' 7"  (1.702 m)   Wt 248 lb (112.5 kg)   LMP 03/09/2020 (Exact Date)   SpO2 98%   BMI 38.84 kg/m  Wt Readings from Last 3 Encounters:  03/10/20 248 lb (112.5 kg)  06/12/19 250 lb (113.4 kg)  01/01/19 250 lb (113.4 kg)     Health Maintenance Due  Topic Date Due  . Hepatitis C Screening  Never done  . COVID-19 Vaccine (1) Never done  . TETANUS/TDAP  12/13/2017  . PAP SMEAR-Modifier  01/27/2018    There are no preventive care reminders to display for this patient.  Lab Results  Component Value Date   TSH 1.163 03/30/2012   Lab Results  Component Value Date   WBC 8.2 02/22/2017   HGB 10.2 (L) 02/22/2017   HCT 33.6 (L) 02/22/2017   MCV 70.6 (L) 02/22/2017   PLT 409 (H) 02/22/2017   Lab Results  Component Value Date   NA 136 02/22/2017   K 3.9 02/22/2017   CO2 20 02/22/2017   GLUCOSE 159 (H) 02/22/2017   BUN 9 02/22/2017   CREATININE 0.67 02/22/2017   BILITOT 0.2 02/22/2017   ALKPHOS 106 02/22/2017   AST 13 02/22/2017   ALT 10 02/22/2017   PROT 6.4 02/22/2017    ALBUMIN 3.6 02/22/2017  CALCIUM 8.6 02/22/2017   Lab Results  Component Value Date   CHOL 235 (H) 03/04/2015   Lab Results  Component Value Date   HDL 42 (L) 03/04/2015   Lab Results  Component Value Date   LDLCALC 148 (H) 03/04/2015   Lab Results  Component Value Date   TRIG 224 (H) 03/04/2015   Lab Results  Component Value Date   CHOLHDL 5.6 (H) 03/04/2015   Lab Results  Component Value Date   HGBA1C 5.6 04/23/2011      Assessment & Plan:   Problem List Items Addressed This Visit      Musculoskeletal and Integument   SI (sacroiliac) joint inflammation ([place])   Pars defect with spondylolisthesis    Unfortunately her spondylolisthesis has become more unstable and they have actually recommended surgery but she really is not able to take 12 weeks off of work.          Other   Major depressive disorder, recurrent episode, moderate ([place])    Now that she is down to just 3 days a week on her medication okay to discontinue it completely. Removed it from medication list.      Relevant Medications   ALPRAZolam (XANAX) 0.5 MG tablet   Attention deficit disorder    Doing well with her current regimen she does not take it 7 days/week.  But I did go ahead and refill her Ritalin today for mail order.      Relevant Medications   methylphenidate (RITALIN) 10 MG tablet    Other Visit Diagnoses    Routine general medical examination at a health care facility    -  Primary   Relevant Orders   COMPLETE METABOLIC PANEL WITH GFR   Lipid panel   CBC with Differential   Cytology - PAP   Ferritin   Screening for cervical cancer       Relevant Orders   Cytology - PAP   Need for hepatitis C screening test       Relevant Orders   Hepatitis C Antibody   Need for immunization against influenza       Relevant Orders   Flu Vaccine QUAD 36+ mos IM (Completed)    Keep up a regular exercise program and make sure you are eating a healthy diet Try to eat 4 servings of dairy a day,  or if you are lactose intolerant take a calcium with vitamin D daily.  Your vaccines are up to date.    Meds ordered this encounter  Medications  . ALPRAZolam (XANAX) 0.5 MG tablet    Sig: Take 1 tablet (0.5 mg total) by mouth daily as needed for anxiety.    Dispense:  15 tablet    Refill:  0  . methylphenidate (RITALIN) 10 MG tablet    Sig: Take 1 tablet (10 mg total) by mouth 2 (two) times daily.    Dispense:  180 tablet    Refill:  0    Follow-up: Return in about 3 months (around 06/09/2020) for add.    Beatrice Lecher, MD

## 2020-03-10 NOTE — Assessment & Plan Note (Signed)
Unfortunately her spondylolisthesis has become more unstable and they have actually recommended surgery but she really is not able to take 12 weeks off of work.

## 2020-03-10 NOTE — Assessment & Plan Note (Addendum)
Doing well with her current regimen she does not take it 7 days/week.  But I did go ahead and refill her Ritalin today for mail order.

## 2020-03-10 NOTE — Assessment & Plan Note (Addendum)
Now that she is down to just 3 days a week on her medication okay to discontinue it completely. Removed it from medication list.

## 2020-03-11 LAB — CYTOLOGY - PAP
Comment: NEGATIVE
Diagnosis: NEGATIVE
High risk HPV: NEGATIVE

## 2020-03-11 NOTE — Progress Notes (Signed)
Call patient: Your Pap smear is normal. Repeat in 5 years.

## 2020-03-12 ENCOUNTER — Other Ambulatory Visit: Payer: Self-pay | Admitting: Family Medicine

## 2020-03-12 DIAGNOSIS — Z1231 Encounter for screening mammogram for malignant neoplasm of breast: Secondary | ICD-10-CM

## 2020-03-13 ENCOUNTER — Ambulatory Visit (INDEPENDENT_AMBULATORY_CARE_PROVIDER_SITE_OTHER): Payer: 59

## 2020-03-13 ENCOUNTER — Other Ambulatory Visit: Payer: Self-pay

## 2020-03-13 DIAGNOSIS — Z1231 Encounter for screening mammogram for malignant neoplasm of breast: Secondary | ICD-10-CM | POA: Diagnosis not present

## 2020-03-17 ENCOUNTER — Other Ambulatory Visit: Payer: Self-pay | Admitting: Family Medicine

## 2020-03-17 DIAGNOSIS — R928 Other abnormal and inconclusive findings on diagnostic imaging of breast: Secondary | ICD-10-CM

## 2020-03-17 LAB — COMPLETE METABOLIC PANEL WITH GFR
AG Ratio: 1.3 (calc) (ref 1.0–2.5)
ALT: 11 U/L (ref 6–29)
AST: 10 U/L (ref 10–35)
Albumin: 3.6 g/dL (ref 3.6–5.1)
Alkaline phosphatase (APISO): 95 U/L (ref 37–153)
BUN: 13 mg/dL (ref 7–25)
CO2: 24 mmol/L (ref 20–32)
Calcium: 9 mg/dL (ref 8.6–10.4)
Chloride: 104 mmol/L (ref 98–110)
Creat: 0.58 mg/dL (ref 0.50–1.05)
GFR, Est African American: 124 mL/min/{1.73_m2} (ref >=60)
GFR, Est Non African American: 107 mL/min/{1.73_m2} (ref >=60)
Globulin: 2.7 g/dL (calc) (ref 1.9–3.7)
Glucose, Bld: 120 mg/dL — ABNORMAL HIGH (ref 65–99)
Potassium: 4.3 mmol/L (ref 3.5–5.3)
Sodium: 135 mmol/L (ref 135–146)
Total Bilirubin: 0.5 mg/dL (ref 0.2–1.2)
Total Protein: 6.3 g/dL (ref 6.1–8.1)

## 2020-03-17 LAB — CBC WITH DIFFERENTIAL/PLATELET
Absolute Monocytes: 469 cells/uL (ref 200–950)
Basophils Absolute: 83 cells/uL (ref 0–200)
Basophils Relative: 1.2 %
Eosinophils Absolute: 511 cells/uL — ABNORMAL HIGH (ref 15–500)
Eosinophils Relative: 7.4 %
HCT: 40.5 % (ref 35.0–45.0)
Hemoglobin: 13.2 g/dL (ref 11.7–15.5)
Lymphs Abs: 1642 cells/uL (ref 850–3900)
MCH: 27.3 pg (ref 27.0–33.0)
MCHC: 32.6 g/dL (ref 32.0–36.0)
MCV: 83.7 fL (ref 80.0–100.0)
MPV: 11.8 fL (ref 7.5–12.5)
Monocytes Relative: 6.8 %
Neutro Abs: 4195 cells/uL (ref 1500–7800)
Neutrophils Relative %: 60.8 %
Platelets: 358 10*3/uL (ref 140–400)
RBC: 4.84 10*6/uL (ref 3.80–5.10)
RDW: 13 % (ref 11.0–15.0)
Total Lymphocyte: 23.8 %
WBC: 6.9 10*3/uL (ref 3.8–10.8)

## 2020-03-17 LAB — HEPATITIS C ANTIBODY
Hepatitis C Ab: NONREACTIVE
SIGNAL TO CUT-OFF: 0.05 (ref <1.00)

## 2020-03-17 LAB — LIPID PANEL
Cholesterol: 226 mg/dL — ABNORMAL HIGH (ref <200)
HDL: 37 mg/dL — ABNORMAL LOW (ref >=50)
LDL Cholesterol (Calc): 154 mg/dL (calc) — ABNORMAL HIGH
Non-HDL Cholesterol (Calc): 189 mg/dL (calc) — ABNORMAL HIGH (ref <130)
Total CHOL/HDL Ratio: 6.1 (calc) — ABNORMAL HIGH (ref <5.0)
Triglycerides: 211 mg/dL — ABNORMAL HIGH (ref <150)

## 2020-03-17 LAB — FERRITIN: Ferritin: 29 ng/mL (ref 16–232)

## 2020-03-17 LAB — HEMOGLOBIN A1C
Hgb A1c MFr Bld: 5.9 % of total Hgb — ABNORMAL HIGH (ref <5.7)
Mean Plasma Glucose: 123 (calc)
eAG (mmol/L): 6.8 (calc)

## 2020-03-18 ENCOUNTER — Encounter: Payer: Self-pay | Admitting: Family Medicine

## 2020-03-18 NOTE — Telephone Encounter (Signed)
Please confirm dose

## 2020-03-19 MED ORDER — CITALOPRAM HYDROBROMIDE 10 MG PO TABS: 10.0000 mg | ORAL_TABLET | Freq: Every day | ORAL | 0 refills | Status: DC

## 2020-03-27 ENCOUNTER — Ambulatory Visit
Admission: RE | Admit: 2020-03-27 | Discharge: 2020-03-27 | Disposition: A | Discharge status: Home / Self Care | Payer: 59 | Source: Ambulatory Visit | Attending: Family Medicine | Admitting: Family Medicine

## 2020-03-27 ENCOUNTER — Other Ambulatory Visit: Payer: Self-pay

## 2020-03-27 DIAGNOSIS — R928 Other abnormal and inconclusive findings on diagnostic imaging of breast: Secondary | ICD-10-CM

## 2020-04-02 ENCOUNTER — Other Ambulatory Visit: Payer: Self-pay | Admitting: Family Medicine

## 2020-05-09 ENCOUNTER — Other Ambulatory Visit: Payer: Self-pay | Admitting: Family Medicine

## 2020-05-17 ENCOUNTER — Other Ambulatory Visit: Payer: Self-pay | Admitting: Family Medicine

## 2020-05-17 ENCOUNTER — Encounter: Payer: Self-pay | Admitting: Family Medicine

## 2020-05-20 MED ORDER — ALPRAZOLAM 0.5 MG PO TABS: 0.5000 mg | ORAL_TABLET | Freq: Every day | ORAL | 0 refills | Status: DC | PRN

## 2020-06-16 ENCOUNTER — Other Ambulatory Visit: Payer: Self-pay

## 2020-06-16 ENCOUNTER — Ambulatory Visit (INDEPENDENT_AMBULATORY_CARE_PROVIDER_SITE_OTHER): Payer: 59 | Admitting: Physician Assistant

## 2020-06-16 ENCOUNTER — Ambulatory Visit (INDEPENDENT_AMBULATORY_CARE_PROVIDER_SITE_OTHER): Payer: 59

## 2020-06-16 ENCOUNTER — Encounter: Payer: Self-pay | Admitting: Physician Assistant

## 2020-06-16 VITALS — BP 142/72 | HR 99 | Ht 67.0 in | Wt 247.0 lb

## 2020-06-16 DIAGNOSIS — M48062 Spinal stenosis, lumbar region with neurogenic claudication: Secondary | ICD-10-CM

## 2020-06-16 DIAGNOSIS — R81 Glycosuria: Secondary | ICD-10-CM

## 2020-06-16 DIAGNOSIS — M47816 Spondylosis without myelopathy or radiculopathy, lumbar region: Secondary | ICD-10-CM

## 2020-06-16 DIAGNOSIS — R252 Cramp and spasm: Secondary | ICD-10-CM | POA: Insufficient documentation

## 2020-06-16 DIAGNOSIS — R55 Syncope and collapse: Secondary | ICD-10-CM | POA: Diagnosis not present

## 2020-06-16 DIAGNOSIS — R918 Other nonspecific abnormal finding of lung field: Secondary | ICD-10-CM | POA: Insufficient documentation

## 2020-06-16 DIAGNOSIS — R7309 Other abnormal glucose: Secondary | ICD-10-CM | POA: Diagnosis not present

## 2020-06-16 LAB — POCT GLYCOSYLATED HEMOGLOBIN (HGB A1C): Hemoglobin A1C: 5.6 % (ref 4.0–5.6)

## 2020-06-16 NOTE — Progress Notes (Signed)
Subjective:    Patient ID: Kristina Martinez, female    DOB: 1971/02/07, 49 y.o.   MRN: [id number]  HPI  Pt is a 49 yo obese female with lumbar spinal stenosis and spondylosis who had episode of muscle cramping and syncope. She has been diagnosed with vasovagal syncope. Her husband took her to ED on 05/09/2020.   In ED random glucose was 236 and glucose found in urine. CXR showed bilateral infiltrates.   She is here to follow up. She denies any problems or concerns except she would like to get a referral to discuss her low back pain.   .. Active Ambulatory Problems    Diagnosis Date Noted  . NONSPECIFIC MESENTERIC LYMPHADENITIS 01/29/2010  . Attention deficit disorder 10/31/2007  . SINUSITIS, CHRONIC 06/04/2009  . Diaphragmatic hernia 12/26/2008  . Other and unspecified ovarian cyst 12/17/2008  . [place] DISEASE, LUMBAR 06/24/2009  . BACK PAIN 01/12/2010  . NIGHT SWEATS 01/28/2010  . HEADACHE 09/16/2009  . IFG (impaired fasting glucose) 09/30/2008  . Abnormal levels of other serum enzymes 03/19/2009  . CARDIAC MURMUR 07/01/2010  . Cervical radiculopathy 01/23/2011  . Major depressive disorder, recurrent episode, moderate ([place]) 07/14/2011  . Anxiety 03/10/2012  . Plantar fasciitis, bilateral 12/08/2012  . HLD (hyperlipidemia) 04/16/2013  . Neuropathy of right ulnar nerve at wrist 06/05/2013  . Rosacea 11/01/2013  . Primary osteoarthritis of left knee 01/09/2015  . Pulmonary nodule, right 06/02/2016  . Dysfunction of left rotator cuff 09/29/2016  . History of nephrolithiasis 01/23/2018  . Chronic anemia 04/23/2016  . Class 2 obesity due to excess calories without serious comorbidity with body mass index (BMI) of 38.0 to 38.9 in adult 06/12/2019  . Eosinophil count raised 08/17/2017  . Gastroesophageal reflux disease without esophagitis 09/08/2017  . Hydronephrosis of right kidney 04/23/2016  . Iron deficiency anemia due to chronic blood loss 08/17/2017  . Irritable bowel  syndrome with diarrhea 09/08/2017  . Reactive thrombocytosis 09/28/2017  . Lumbar radiculopathy 06/26/2019  . Pars defect with spondylolisthesis 06/26/2019  . SI (sacroiliac) joint inflammation ([place]) 03/10/2020  . Bilateral pulmonary infiltrates on CXR 06/16/2020  . Spinal stenosis of lumbar region with neurogenic claudication 06/16/2020  . Lumbar spondylosis 06/16/2020  . Muscle cramps 06/16/2020  . Vasovagal syncope 06/16/2020   Resolved Ambulatory Problems    Diagnosis Date Noted  . ANXIETY DISORDER 08/28/2008  . Essential hypertension, benign 09/30/2008  . Acute bronchitis 04/08/2009  . TMJ PAIN 08/28/2008  . HEMATOCHEZIA 06/24/2009  . UTI 01/12/2010  . MENORRHAGIA 03/17/2009  . LEG PAIN 08/14/2009  . OTHER ACQUIRED DEFORMITY OF ANKLE AND FOOT OTHER 05/28/2009  . FATIGUE 01/12/2010  . Dysuria 01/21/2010  . BLOOD CHEMISTRY, ABNORMAL 12/18/2008  . ACHILLES TENDON TEAR 01/02/2009  . BREAST PAIN, LEFT 06/15/2010  . Irregular menstrual cycle 07/01/2010  . Worms in stool 12/10/2010  . ADHD (attention deficit hyperactivity disorder) 05/24/2011  . Hypertension 05/24/2011  . Right lateral epicondylitis 12/08/2012  . Nausea and vomiting 04/15/2013  . Dehydration 04/15/2013  . Pancreatitis, acute 04/15/2013  . Cystitis 02/10/2015  . Needs flu shot 02/26/2016  . Acute right flank pain 04/23/2016  . Generalized abdominal pain 09/08/2017   Past Medical History:  Diagnosis Date  . Achilles tendon injury   . ADD (attention deficit disorder)   . Depression   . IBS (irritable bowel syndrome)   . Migraine   . Obese   . Ovarian cyst   . PMDD (premenstrual dysphoric disorder)   . Vaso-vagal reaction  Review of Systems  All other systems reviewed and are negative.      Objective:   Physical Exam Vitals reviewed.  Constitutional:      Appearance: Normal appearance.  HENT:     Head: Normocephalic.  Neck:     Vascular: No carotid bruit.  Cardiovascular:     Rate  and Rhythm: Normal rate and regular rhythm.     Pulses: Normal pulses.     Heart sounds: Normal heart sounds.  Pulmonary:     Effort: Pulmonary effort is normal.     Breath sounds: Normal breath sounds. No wheezing or rhonchi.  Chest:     Chest wall: No tenderness.  Musculoskeletal:     Right lower leg: No edema.     Left lower leg: No edema.  Neurological:     General: No focal deficit present.     Mental Status: She is alert and oriented to person, place, and time.  Psychiatric:        Mood and Affect: Mood normal.           Assessment & Plan:  Marland KitchenMarland KitchenAmera was seen today for hospitalization follow-up.  Diagnoses and all orders for this visit:  Vasovagal syncope  Muscle cramps  Elevated random blood glucose level -     POCT glycosylated hemoglobin (Hb A1C)  Glucose found in urine on examination -     POCT glycosylated hemoglobin (Hb A1C)  Bilateral pulmonary infiltrates on CXR -     DG Chest 2 View  Spinal stenosis of lumbar region with neurogenic claudication -     Ambulatory referral to Neurosurgery  Lumbar spondylosis -     Ambulatory referral to Neurosurgery  CXR in ED showed bilateral infiltrates and pulmonary edema. Repeat CXR today.   Random glucose was over 200 in ED.  .. Results for orders placed or performed in visit on 06/16/20  POCT glycosylated hemoglobin (Hb A1C)  Result Value Ref Range   Hemoglobin A1C 5.6 4.0 - 5.6 %   HbA1c POC (<> result, manual entry)     HbA1c, POC (prediabetic range)     HbA1c, POC (controlled diabetic range)     Reassured no diabetes. Improved since last recheck. Continue to watch sugars/carbs.   No more syncopal events. Pt continues to have some muscle cramping and back pain this is every day symptoms.She would like a referral to spine surgery. She went before and recommended surgery but then doctor moved.

## 2020-06-17 NOTE — Progress Notes (Signed)
GREAT news. No infiltrates or pulmonary edema. Stable but large hiatal hernia.

## 2020-07-15 ENCOUNTER — Ambulatory Visit: Payer: Self-pay | Admitting: Podiatry

## 2020-07-18 ENCOUNTER — Other Ambulatory Visit: Payer: Self-pay | Admitting: Neurological Surgery

## 2020-07-18 DIAGNOSIS — M4317 Spondylolisthesis, lumbosacral region: Secondary | ICD-10-CM

## 2020-07-22 ENCOUNTER — Other Ambulatory Visit: Payer: Self-pay | Admitting: Podiatry

## 2020-07-22 ENCOUNTER — Ambulatory Visit (INDEPENDENT_AMBULATORY_CARE_PROVIDER_SITE_OTHER): Payer: 59 | Admitting: Podiatry

## 2020-07-22 ENCOUNTER — Other Ambulatory Visit: Payer: Self-pay

## 2020-07-22 ENCOUNTER — Ambulatory Visit (INDEPENDENT_AMBULATORY_CARE_PROVIDER_SITE_OTHER): Payer: 59

## 2020-07-22 DIAGNOSIS — M2011 Hallux valgus (acquired), right foot: Secondary | ICD-10-CM | POA: Diagnosis not present

## 2020-07-22 DIAGNOSIS — M79671 Pain in right foot: Secondary | ICD-10-CM

## 2020-07-22 DIAGNOSIS — M2041 Other hammer toe(s) (acquired), right foot: Secondary | ICD-10-CM | POA: Diagnosis not present

## 2020-07-22 DIAGNOSIS — M21961 Unspecified acquired deformity of right lower leg: Secondary | ICD-10-CM

## 2020-07-22 DIAGNOSIS — M7751 Other enthesopathy of right foot: Secondary | ICD-10-CM

## 2020-07-22 MED ORDER — BETAMETHASONE SOD PHOS & ACET 6 (3-3) MG/ML IJ SUSP
6.0000 mg | Freq: Once | INTRAMUSCULAR | Status: AC
  Administered 2020-07-22: 6 mg

## 2020-07-22 NOTE — Progress Notes (Signed)
  Subjective:  Patient ID: Kristina Martinez, female    DOB: [birth year],  MRN: [id number]  Chief Complaint  Patient presents with  . Toe Pain    Pain at Rt 2nd toe (inside of toe)-radiates to dorsal foot  x christmas week; 7/10 dull achy pain - no injuyr -pt denies rendess/swelling/numbness -worse with sitting or at night Tx: none     50 y.o. female presents with the above complaint. History confirmed with patient.   Objective:  Physical Exam: warm, good capillary refill, no trophic changes or ulcerative lesions, normal DP and PT pulses and normal sensory exam.  Right Foot: bunion deformity noted, tenderness of the 2nd metatarsal head and 2nd toe hammertoe - semi-rigid   No images are attached to the encounter.  Radiographs: X-ray of the right foot: no fracture, dislocation, swelling or degenerative changes noted, hallux valgus deformity and elongated second metatarsal, digital contractures noted. Assessment:   1. Capsulitis of metatarsophalangeal (MTP) joint of right foot   2. Hammertoe of right foot   3. Metatarsal deformity, right   4. Hallux valgus, right    Plan:  Patient was evaluated and treated and all questions answered.  Capsulitis and Hammertoe -Educated on etiology -XR reviewed with patient -Injection delivered to the painful joint -Discussed taping the toe in plantarflexion. Educated on how to do so - patient to do daily.  Procedure: Joint Injection Location: Right 2nd MPJ joint Skin Prep: Alcohol. Injectate: 0.5 cc 1% lidocaine plain, 0.5 cc celestone  Disposition: Patient tolerated procedure well. Injection site dressed with a band-aid.   Return in about 1 month (around 08/22/2020) for Capsulitis.

## 2020-07-28 ENCOUNTER — Other Ambulatory Visit: Payer: Self-pay | Admitting: Family Medicine

## 2020-08-03 ENCOUNTER — Ambulatory Visit
Admission: RE | Admit: 2020-08-03 | Discharge: 2020-08-03 | Disposition: A | Discharge status: Home / Self Care | Payer: 59 | Source: Ambulatory Visit | Attending: Neurological Surgery | Admitting: Neurological Surgery

## 2020-08-03 ENCOUNTER — Other Ambulatory Visit: Payer: Self-pay

## 2020-08-03 DIAGNOSIS — M4317 Spondylolisthesis, lumbosacral region: Secondary | ICD-10-CM

## 2020-08-26 ENCOUNTER — Ambulatory Visit: Payer: 59 | Admitting: Podiatry

## 2021-01-11 ENCOUNTER — Other Ambulatory Visit: Payer: Self-pay | Admitting: Family Medicine

## 2021-01-14 NOTE — Telephone Encounter (Signed)
Left voicemail message for patient to call back to schedule this appt. AM

## 2021-01-14 NOTE — Telephone Encounter (Signed)
Please call pt and have her schedule f/u for medication refill on Celexa and labs thanks.

## 2021-03-11 ENCOUNTER — Encounter: Payer: Self-pay | Admitting: Family Medicine

## 2021-03-12 ENCOUNTER — Telehealth: Payer: 59 | Admitting: Emergency Medicine

## 2021-03-12 DIAGNOSIS — U071 COVID-19: Secondary | ICD-10-CM

## 2021-03-12 MED ORDER — BENZONATATE 100 MG PO CAPS
100.0000 mg | ORAL_CAPSULE | Freq: Two times a day (BID) | ORAL | 0 refills | Status: DC | PRN
Start: 2021-03-12 — End: 2021-04-30

## 2021-03-12 NOTE — Progress Notes (Signed)
E-Visit  for Positive Covid Test Result  We are sorry you are not feeling well. We are here to help!  You have tested positive for COVID-19, meaning that you were infected with the novel coronavirus and could give the virus to others.  It is vitally important that you stay home so you do not spread it to others.      Please continue isolation at home, for at least 10 days since the start of your symptoms and until you have had 24 hours with no fever (without taking a fever reducer) and with improving of symptoms.  If you have no symptoms but tested positive (or all symptoms resolve after 5 days and you have no fever) you can leave your house but continue to wear a mask around others for an additional 5 days. If you have a fever,continue to stay home until you have had 24 hours of no fever. Most cases improve 5-10 days from onset but we have seen a small number of patients who have gotten worse after the 10 days.  Please be sure to watch for worsening symptoms and remain taking the proper precautions.   Go to the nearest hospital ED for assessment if fever/cough/breathlessness are severe or illness seems like a threat to life.    The following symptoms may appear 2-14 days after exposure: Fever Cough Shortness of breath or difficulty breathing Chills Repeated shaking with chills Muscle pain Headache Sore throat New loss of taste or smell Fatigue Congestion or runny nose Nausea or vomiting Diarrhea  You can use medication such as prescription cough medication called Tessalon Perles 100 mg. You may take 1-2 capsules every 8 hours as needed for cough  If you are interested in seeing if you qualify for a COVID antiviral such as PAXLOVID or MOLNUPIRAVIR, you can make an appointment with our virtual urgent care, go to a local urgent care, or see your primary care doctor.  We can't prescribe these medications through e-visits.  You may also take acetaminophen (Tylenol) as needed for  fever.  HOME CARE: Only take medications as instructed by your medical team. Drink plenty of fluids and get plenty of rest. A steam or ultrasonic humidifier can help if you have congestion.   GET HELP RIGHT AWAY IF YOU HAVE EMERGENCY WARNING SIGNS.  Call 911 or proceed to your closest emergency facility if: You develop worsening high fever. Trouble breathing Bluish lips or face Persistent pain or pressure in the chest New confusion Inability to wake or stay awake You cough up blood. Your symptoms become more severe Inability to hold down food or fluids  This list is not all possible symptoms. Contact your medical provider for any symptoms that are severe or concerning to you.    Your e-visit answers were reviewed by a board certified advanced clinical practitioner to complete your personal care plan.  Depending on the condition, your plan could have included both over the counter or prescription medications.  If there is a problem please reply once you have received a response from your provider.  Your safety is important to [place].  If you have drug allergies check your prescription carefully.    You can use MyChart to ask questions about today's visit, request a non-urgent call back, or ask for a work or school excuse for 24 hours related to this e-Visit. If it has been greater than 24 hours you will need to follow up with your provider, or enter a new e-Visit  to address those concerns. You will get an e-mail in the next two days asking about your experience.  I hope that your e-visit has been valuable and will speed your recovery. Thank you for using e-visits.     Approximately 5 minutes was used in reviewing the patient's chart, questionnaire, prescribing medications, and documentation.

## 2021-03-13 ENCOUNTER — Telehealth (INDEPENDENT_AMBULATORY_CARE_PROVIDER_SITE_OTHER): Payer: 59 | Admitting: Family Medicine

## 2021-03-13 ENCOUNTER — Encounter: Payer: Self-pay | Admitting: Family Medicine

## 2021-03-13 DIAGNOSIS — U071 COVID-19: Secondary | ICD-10-CM

## 2021-03-13 MED ORDER — MOLNUPIRAVIR EUA 200MG CAPSULE
4.0000 | ORAL_CAPSULE | Freq: Two times a day (BID) | ORAL | 0 refills | Status: AC
Start: 2021-03-13 — End: 2021-03-18

## 2021-03-13 MED ORDER — PREDNISONE 20 MG PO TABS: 40.0000 mg | ORAL_TABLET | Freq: Every day | ORAL | 0 refills | Status: AC

## 2021-03-13 NOTE — Patient Instructions (Signed)
Molnupiravir patient fact sheet: http://www.craig-choi.com/    Over the counter medications that may be helpful for symptoms:  Guaifenesin 1200 mg extended release tabs twice daily, with plenty of water For cough and congestion Brand name: Mucinex   Pseudoephedrine 30 mg, one or two tabs every 4 to 6 hours For sinus congestion Brand name: Sudafed You must get this from the pharmacy counter.  Oxymetazoline nasal spray each morning, one spray in each nostril, for NO MORE THAN 3 days  For nasal and sinus congestion Brand name: Afrin Saline nasal spray or Saline Nasal Irrigation 3-5 times a day For nasal and sinus congestion Brand names: Ocean or AYR Fluticasone nasal spray, one spray in each nostril, each morning (after oxymetazoline and saline, if used) For nasal and sinus congestion Brand name: Flonase Warm salt water gargles  For sore throat Every few hours as needed Alternate ibuprofen 400-600 mg and acetaminophen 1000 mg every 4-6 hours For fever, body aches, headache Brand names: Motrin or Advil and Tylenol Dextromethorphan 12-hour cough version 30 mg every 12 hours  For cough Brand name: Delsym Stop all other cold medications for now (Nyquil, Dayquil, Tylenol Cold, Theraflu, etc) and other non-prescription cough/cold preparations. Many of these have the same ingredients listed above and could cause an overdose of medication.   Herbal treatments that have been shown to be helpful in some patients include: Vitamin C '1000mg'$  per day Vitamin D 4000iU per day Zinc '100mg'$  per day Quercetin 25-'500mg'$  twice a day Melatonin 5-'10mg'$  at bedtime  General Instructions Allow your body to rest Drink PLENTY of fluids Isolate yourself from everyone, even family, until test results have returned  If your COVID-19 test is positive Then you ARE INFECTED and you can pass the virus to others You must quarantine from others for a minimum of  5 days since symptoms started  AND You are fever free for 24 hours WITHOUT any medication to reduce fever AND Your symptoms are improving Wear mask for the next 5 days If not improved by day 5, quarantine for the full 10 days. Do not go to the store or other public areas Do not go around household members who are not known to be infected with COVID-19 If you MUST leave your area of quarantine (example: go to a bathroom you share with others in your home), you must Wear a mask Wash your hands thoroughly Wipe down any surfaces you touch Do not share food, drinks, towels, or other items with other persons Dispose of your own tissues, food containers, etc  Once you have recovered, please continue good preventive care measures, including:  wearing a mask when in public wash your hands frequently avoid touching your face/nose/eyes cover coughs/sneezes with the inside of your elbow stay out of crowds keep a 6 foot distance from others  If you develop severe shortness of breath, uncontrolled fevers, coughing up blood, confusion, chest pain, or signs of dehydration (such as significantly decreased urine amounts or dizziness with standing) please go to the ER.

## 2021-03-13 NOTE — Progress Notes (Signed)
Virtual Video Visit via MyChart Note  I connected with  Kristina Martinez on 03/13/21 at 10:10 AM EDT by the video enabled telemedicine application for MyChart, and verified that I am speaking with the correct person using two identifiers.   I introduced myself as a Designer, jewellery with the practice. We discussed the limitations of evaluation and management by telemedicine and the availability of in person appointments. The patient expressed understanding and agreed to proceed.  Participating parties in this visit include: The patient and the nurse practitioner listed.  The patient is: At home I am: In the office - [place]  Subjective:    CC:  Chief Complaint  Patient presents with   Covid Positive     HPI: Kristina Martinez is a 50 y.o. year old female presenting today via [place] today for positive COVID test.  Patient started with symptoms on Monday night and tested positive on Wednesday. States she is having coughing, congestion, headaches, body aches, sweats/chills, diarrhea, chest tightness with faint wheezing - reports childhood asthma. She has had 3 COVID vaccines. Reports she had a telehealth visit yesterday and got cough medicine, but she is concerned about the breathing.    Past medical history, Surgical history, Family history not pertinant except as noted below, Social history, Allergies, and medications have been entered into the medical record, reviewed, and corrections made.   Review of Systems:  All review of systems negative except what is listed in the HPI   Objective:    General:  Speaking clearly in complete sentences. Absent shortness of breath noted.   Alert and oriented x3.   Normal judgment.  Absent acute distress.   Impression and Recommendations:    1. COVID-19 - predniSONE (DELTASONE) 20 MG tablet; Take 2 tablets (40 mg total) by mouth daily with breakfast for 5 days.  Dispense: 10 tablet; Refill: 0 - molnupiravir EUA  (LAGEVRIO) 200 mg CAPS capsule; Take 4 capsules (800 mg total) by mouth 2 (two) times daily for 5 days.  Dispense: 40 capsule; Refill: 0  Patient would like to have antiviral - no recent labs to check kidney function, so will start molnupiravir. For the chest tightness, wheezing and history of childhood asthma will give prednisone burst - she declines inhaler at this time. Education added to AVS. Patient aware of signs/symptoms requiring further/urgent evaluation.    Follow-up if symptoms worsen or fail to improve.    I discussed the assessment and treatment plan with the patient. The patient was provided an opportunity to ask questions and all were answered. The patient agreed with the plan and demonstrated an understanding of the instructions.   The patient was advised to call back or seek an in-person evaluation if the symptoms worsen or if the condition fails to improve as anticipated.  I spent 20 minutes dedicated to the care of this patient on the date of this encounter to include pre-visit chart review of prior notes and results, face-to-face time with the patient, and post-visit ordering of testing as indicated.   Terrilyn Saver, NP

## 2021-03-17 ENCOUNTER — Other Ambulatory Visit: Payer: Self-pay | Admitting: Family Medicine

## 2021-04-09 ENCOUNTER — Other Ambulatory Visit: Payer: Self-pay | Admitting: Family Medicine

## 2021-04-10 NOTE — Telephone Encounter (Signed)
Please call pt she will need to schedule a CPE for her medication refills. It has been 1 year since she has seen Dr. Madilyn Fireman. Thanks  90 day refill sent for now.Marland Kitchen

## 2021-04-10 NOTE — Telephone Encounter (Signed)
Physical has been scheduled for 05/18/2021 @ 930 am. AM

## 2021-04-30 ENCOUNTER — Emergency Department (INDEPENDENT_AMBULATORY_CARE_PROVIDER_SITE_OTHER)
Admission: EM | Admit: 2021-04-30 | Discharge: 2021-04-30 | Disposition: A | Discharge status: Home / Self Care | Payer: 59 | Source: Home / Self Care | Attending: Family Medicine | Admitting: Family Medicine

## 2021-04-30 ENCOUNTER — Other Ambulatory Visit: Payer: Self-pay

## 2021-04-30 DIAGNOSIS — J029 Acute pharyngitis, unspecified: Secondary | ICD-10-CM

## 2021-04-30 DIAGNOSIS — R6889 Other general symptoms and signs: Secondary | ICD-10-CM | POA: Diagnosis not present

## 2021-04-30 LAB — POCT RAPID STREP A (OFFICE): Rapid Strep A Screen: NEGATIVE

## 2021-04-30 MED ORDER — AMOXICILLIN 875 MG PO TABS: 875.0000 mg | ORAL_TABLET | Freq: Two times a day (BID) | ORAL | 0 refills | Status: DC

## 2021-04-30 NOTE — ED Triage Notes (Signed)
Pt presents with complaints of sore throat, chills, and unknown fever that began Monday. Pt taking ibuprofen for pain

## 2021-04-30 NOTE — ED Provider Notes (Signed)
[facility]    CSN: [id number] Arrival date & time: 04/30/21  1104      History   Chief Complaint Chief Complaint  Patient presents with   Sore Throat   Chills    HPI Kristina Martinez is a 50 y.o. female.   HPI Kristina Martinez has been sick for sore throat chills and fever that started Monday.  She has been sick for 4 days.  Over the weekend she traveled to [place] to visit a new niece.  She states her niece had a runny nose. She mostly has a sore throat.  She is interested in strep testing. She understands that she is out of the window for influenza at treatment Past Medical History:  Diagnosis Date   Achilles tendon injury    ADD (attention deficit disorder)    Depression    IBS (irritable bowel syndrome)    Migraine    Obese    Ovarian cyst    PMDD (premenstrual dysphoric disorder)    Vaso-vagal reaction     Patient Active Problem List   Diagnosis Date Noted   Bilateral pulmonary infiltrates on CXR 06/16/2020   Spinal stenosis of lumbar region with neurogenic claudication 06/16/2020   Lumbar spondylosis 06/16/2020   Muscle cramps 06/16/2020   Vasovagal syncope 06/16/2020   SI (sacroiliac) joint inflammation ([place]) 03/10/2020   Lumbar radiculopathy 06/26/2019   Pars defect with spondylolisthesis 06/26/2019   Class 2 obesity due to excess calories without serious comorbidity with body mass index (BMI) of 38.0 to 38.9 in adult 06/12/2019   History of nephrolithiasis 01/23/2018   Reactive thrombocytosis 09/28/2017   Gastroesophageal reflux disease without esophagitis 09/08/2017   Irritable bowel syndrome with diarrhea 09/08/2017   Eosinophil count raised 08/17/2017   Iron deficiency anemia due to chronic blood loss 08/17/2017   Dysfunction of left rotator cuff 09/29/2016   Pulmonary nodule, right 06/02/2016   Chronic anemia 04/23/2016   Hydronephrosis of right kidney 04/23/2016   Primary osteoarthritis of left knee 01/09/2015   Rosacea 11/01/2013    Neuropathy of right ulnar nerve at wrist 06/05/2013   HLD (hyperlipidemia) 04/16/2013   Plantar fasciitis, bilateral 12/08/2012   Anxiety 03/10/2012   Major depressive disorder, recurrent episode, moderate ([place]) 07/14/2011   Cervical radiculopathy 01/23/2011   CARDIAC MURMUR 07/01/2010   NONSPECIFIC MESENTERIC LYMPHADENITIS 01/29/2010   NIGHT SWEATS 01/28/2010   BACK PAIN 01/12/2010   HEADACHE 09/16/2009   [place] DISEASE, LUMBAR 06/24/2009   SINUSITIS, CHRONIC 06/04/2009   Abnormal levels of other serum enzymes 03/19/2009   Diaphragmatic hernia 12/26/2008   Other and unspecified ovarian cyst 12/17/2008   IFG (impaired fasting glucose) 09/30/2008   Attention deficit disorder 10/31/2007    Past Surgical History:  Procedure Laterality Date   LAPAROSCOPIC CHOLECYSTECTOMY  2011    OB History   No obstetric history on file.      Home Medications    Prior to Admission medications   Medication Sig Start Date End Date Taking? Authorizing Provider  amoxicillin (AMOXIL) 875 MG tablet Take 1 tablet (875 mg total) by mouth 2 (two) times daily. 04/30/21  Yes Raylene Everts, MD  ALPRAZolam Duanne Moron) 0.5 MG tablet TAKE 1 TABLET(0.5 MG) BY MOUTH DAILY AS NEEDED FOR ANXIETY 07/30/20   Hali Marry, MD  citalopram (CELEXA) 10 MG tablet TAKE 1 TABLET BY MOUTH  DAILY 01/14/21   Hali Marry, MD  levonorgestrel-ethinyl estradiol (SEASONALE) 0.15-0.03 MG tablet TAKE 1 TABLET BY MOUTH  DAILY 04/10/21  Hali Marry, MD  methylphenidate (RITALIN) 10 MG tablet Take 1 tablet (10 mg total) by mouth 2 (two) times daily. 03/10/20   Hali Marry, MD  omeprazole (PRILOSEC) 40 MG capsule Take by mouth. 08/08/17   [provider]    Family History Family History  Problem Relation Age of Onset   Skin cancer Mother        skin   Prostate cancer Father    Heart disease Father        AMI   Heart failure Father    Stroke Brother 61   Breast cancer Maternal  Grandmother    Uterine cancer Maternal Grandmother        ? cervix   Breast cancer Paternal Grandmother     Social History Social History   Tobacco Use   Smoking status: Never   Smokeless tobacco: Never  Substance Use Topics   Alcohol use: Yes    Comment: 1/ in 6 months   Drug use: No     Allergies   Patient has no known allergies.   Review of Systems Review of Systems See HPI  Physical Exam Triage Vital Signs ED Triage Vitals  Enc Vitals Group     BP 04/30/21 1154 (!) 128/93     Pulse Rate 04/30/21 1154 85     Resp 04/30/21 1154 14     Temp 04/30/21 1154 99.3 F (37.4 C)     Temp Source 04/30/21 1154 Oral     SpO2 04/30/21 1154 97 %     Weight --      Height --      Head Circumference --      Peak Flow --      Pain Score 04/30/21 1155 4     Pain Loc --      Pain Edu? --      Excl. in [place]? --    No data found.  Updated Vital Signs BP (!) 128/93 (BP Location: Right Arm)   Pulse 85   Temp 99.3 F (37.4 C) (Oral)   Resp 14   SpO2 97%      Physical Exam Constitutional:      General: She is not in acute distress.    Appearance: She is well-developed.  HENT:     Head: Normocephalic and atraumatic.     Right Ear: Tympanic membrane and ear canal normal.     Left Ear: Tympanic membrane and ear canal normal.     Nose: No congestion or rhinorrhea.     Mouth/Throat:     Pharynx: Posterior oropharyngeal erythema present.  Eyes:     Conjunctiva/sclera: Conjunctivae normal.     Pupils: Pupils are equal, round, and reactive to light.  Cardiovascular:     Rate and Rhythm: Normal rate and regular rhythm.     Heart sounds: Normal heart sounds.  Pulmonary:     Effort: Pulmonary effort is normal. No respiratory distress.     Breath sounds: Normal breath sounds.  Abdominal:     General: There is no distension.     Palpations: Abdomen is soft.  Musculoskeletal:        General: Normal range of motion.     Cervical back: Normal range of motion.   Lymphadenopathy:     Cervical: No cervical adenopathy.  Skin:    General: Skin is warm and dry.  Neurological:     Mental Status: She is alert.     UC Treatments / Results  Labs (all labs ordered are listed, but only abnormal results are displayed) Labs Reviewed  CULTURE, GROUP A STREP  POCT RAPID STREP A (OFFICE)    EKG   Radiology No results found.  Procedures Procedures (including critical care time)  Medications Ordered in UC Medications - No data to display  Initial Impression / Assessment and Plan / UC Course  I have reviewed the triage vital signs and the nursing notes.  Pertinent labs & imaging results that were available during my care of the patient were reviewed by me and considered in my medical decision making (see chart for details).     Strep test is negative.  Discussed that most of his infections are caused by a virus.  I encouraged her to try symptomatic care for another couple days.  If she fails to improve she can fill and take the amoxicillin. Final Clinical Impressions(s) / UC Diagnoses   Final diagnoses:  Flu-like symptoms     Discharge Instructions      Continue with lots of fluids.  You need to drink a little bit more. May use Mucinex DM for cough and cold, try Flonase for the sinus congestion If you fail to improve over the next few days fill and take the antibiotic amoxicillin Call for problems   ED Prescriptions     Medication Sig Dispense Auth. Provider   amoxicillin (AMOXIL) 875 MG tablet Take 1 tablet (875 mg total) by mouth 2 (two) times daily. 14 tablet Raylene Everts, MD      PDMP not reviewed this encounter.   Raylene Everts, MD 04/30/21 [phone]

## 2021-04-30 NOTE — Discharge Instructions (Signed)
Continue with lots of fluids.  You need to drink a little bit more. May use Mucinex DM for cough and cold, try Flonase for the sinus congestion If you fail to improve over the next few days fill and take the antibiotic amoxicillin Call for problems

## 2021-05-04 LAB — CULTURE, GROUP A STREP: Strep A Culture: NEGATIVE

## 2021-05-05 ENCOUNTER — Telehealth (INDEPENDENT_AMBULATORY_CARE_PROVIDER_SITE_OTHER): Payer: 59 | Admitting: Family Medicine

## 2021-05-05 ENCOUNTER — Encounter: Payer: Self-pay | Admitting: Family Medicine

## 2021-05-05 DIAGNOSIS — R112 Nausea with vomiting, unspecified: Secondary | ICD-10-CM

## 2021-05-05 DIAGNOSIS — R1010 Upper abdominal pain, unspecified: Secondary | ICD-10-CM

## 2021-05-05 MED ORDER — ONDANSETRON 4 MG PO TBDP: 4.0000 mg | ORAL_TABLET | Freq: Three times a day (TID) | ORAL | 0 refills | Status: DC | PRN

## 2021-05-05 NOTE — Progress Notes (Signed)
Virtual Visit via Video Note  I connected with Kristina Martinez on 05/05/21 at 11:30 AM EST by a video enabled telemedicine application and verified that I am speaking with the correct person using two identifiers.   I discussed the limitations of evaluation and management by telemedicine and the availability of in person appointments. The patient expressed understanding and agreed to proceed.  Patient location: at home Provider location: in office  Subjective:    CC: 6 weeks.   HPI: Had COVID  about 6 weeks ago and has been sick ever since. Went to UC last Thur for ST, chills dn fever.  She was rx amox but says she never started it. She didn't have a cough at that time.  Then the following night, Friday night started feeling nauseated and experiencing pain in her sides lateral to the breast area radiating around to her back and to the front almost like a bandlike pain.  Sometimes dull and achy sometimes more sharp.  Not worse with eating, or not eating.  Initially she was not experiencing vomiting but that started yesterday.  Still having chills.  Has had diarrhea off and on x 6 weeks.  She hasn't had any antibiotics.  No new rash. No blood in the stool.  Using  IBU and dramamine.  She does not have a gallbladder.  Past medical history, Surgical history, Family history not pertinant except as noted below, Social history, Allergies, and medications have been entered into the medical record, reviewed, and corrections made.    Objective:    General: Speaking clearly in complete sentences without any shortness of breath.  Alert and oriented x3.  Normal judgment. No apparent acute distress.    Impression and Recommendations:    No problem-specific Assessment & Plan notes found for this encounter.  Bandlike pain radiating from the epigastric area all the way around to her back-unclear etiology consider hepatitis which could be secondary to recent viral illness.  We will check lipase to  evaluate for pancreatitis and she also has significant nausea and vomiting.  We will check a CBC with differential if her white count significantly elevated then consider CT scan of the abdomen for further work-up.  We will also check renal function. Zofran sent for nausea.     Orders Placed This Encounter  Procedures   CBC with Differential/Platelet   COMPLETE METABOLIC PANEL WITH GFR   Hepatitis, Acute   Lipase    Meds ordered this encounter  Medications   ondansetron (ZOFRAN ODT) 4 MG disintegrating tablet    Sig: Take 1 tablet (4 mg total) by mouth every 8 (eight) hours as needed for nausea or vomiting.    Dispense:  21 tablet    Refill:  0     I discussed the assessment and treatment plan with the patient. The patient was provided an opportunity to ask questions and all were answered. The patient agreed with the plan and demonstrated an understanding of the instructions.   The patient was advised to call back or seek an in-person evaluation if the symptoms worsen or if the condition fails to improve as anticipated.   Beatrice Lecher, MD

## 2021-05-06 LAB — CBC WITH DIFFERENTIAL/PLATELET
Absolute Monocytes: 564 cells/uL (ref 200–950)
Basophils Absolute: 56 cells/uL (ref 0–200)
Basophils Relative: 0.6 %
Eosinophils Absolute: 310 cells/uL (ref 15–500)
Eosinophils Relative: 3.3 %
HCT: 42.6 % (ref 35.0–45.0)
Hemoglobin: 14.2 g/dL (ref 11.7–15.5)
Lymphs Abs: 2576 cells/uL (ref 850–3900)
MCH: 27.6 pg (ref 27.0–33.0)
MCHC: 33.3 g/dL (ref 32.0–36.0)
MCV: 82.9 fL (ref 80.0–100.0)
MPV: 11 fL (ref 7.5–12.5)
Monocytes Relative: 6 %
Neutro Abs: 5894 cells/uL (ref 1500–7800)
Neutrophils Relative %: 62.7 %
Platelets: 395 10*3/uL (ref 140–400)
RBC: 5.14 10*6/uL — ABNORMAL HIGH (ref 3.80–5.10)
RDW: 13 % (ref 11.0–15.0)
Total Lymphocyte: 27.4 %
WBC: 9.4 10*3/uL (ref 3.8–10.8)

## 2021-05-06 LAB — COMPLETE METABOLIC PANEL WITH GFR
AG Ratio: 1.4 (calc) (ref 1.0–2.5)
ALT: 10 U/L (ref 6–29)
AST: 15 U/L (ref 10–35)
Albumin: 4.1 g/dL (ref 3.6–5.1)
Alkaline phosphatase (APISO): 106 U/L (ref 37–153)
BUN: 11 mg/dL (ref 7–25)
CO2: 24 mmol/L (ref 20–32)
Calcium: 9.5 mg/dL (ref 8.6–10.4)
Chloride: 101 mmol/L (ref 98–110)
Creat: 0.74 mg/dL (ref 0.50–1.03)
Globulin: 3 g/dL (calc) (ref 1.9–3.7)
Glucose, Bld: 98 mg/dL (ref 65–139)
Potassium: 4 mmol/L (ref 3.5–5.3)
Sodium: 136 mmol/L (ref 135–146)
Total Bilirubin: 0.5 mg/dL (ref 0.2–1.2)
Total Protein: 7.1 g/dL (ref 6.1–8.1)
eGFR: 97 mL/min/{1.73_m2} (ref >=60)

## 2021-05-06 LAB — HEPATITIS PANEL, ACUTE
Hep A IgM: NONREACTIVE
Hep B C IgM: NONREACTIVE
Hepatitis B Surface Ag: NONREACTIVE
Hepatitis C Ab: NONREACTIVE
SIGNAL TO CUT-OFF: 0.15 (ref <1.00)

## 2021-05-06 LAB — LIPASE: Lipase: 7 U/L (ref 7–60)

## 2021-05-07 NOTE — Progress Notes (Signed)
Labs all look good. I hope that you are feeling better. If not please let me know.

## 2021-05-18 ENCOUNTER — Ambulatory Visit (INDEPENDENT_AMBULATORY_CARE_PROVIDER_SITE_OTHER): Payer: 59 | Admitting: Family Medicine

## 2021-05-18 ENCOUNTER — Other Ambulatory Visit: Payer: Self-pay

## 2021-05-18 ENCOUNTER — Encounter: Payer: Self-pay | Admitting: Family Medicine

## 2021-05-18 VITALS — BP 138/77 | HR 65 | Ht 67.0 in | Wt 241.0 lb

## 2021-05-18 DIAGNOSIS — R21 Rash and other nonspecific skin eruption: Secondary | ICD-10-CM

## 2021-05-18 DIAGNOSIS — D649 Anemia, unspecified: Secondary | ICD-10-CM | POA: Diagnosis not present

## 2021-05-18 DIAGNOSIS — Z0000 Encounter for general adult medical examination without abnormal findings: Secondary | ICD-10-CM | POA: Diagnosis not present

## 2021-05-18 DIAGNOSIS — F988 Other specified behavioral and emotional disorders with onset usually occurring in childhood and adolescence: Secondary | ICD-10-CM | POA: Diagnosis not present

## 2021-05-18 LAB — LIPID PANEL W/REFLEX DIRECT LDL
Cholesterol: 261 mg/dL — ABNORMAL HIGH
HDL: 40 mg/dL — ABNORMAL LOW
LDL Cholesterol (Calc): 170 mg/dL — ABNORMAL HIGH
Non-HDL Cholesterol (Calc): 221 mg/dL — ABNORMAL HIGH
Total CHOL/HDL Ratio: 6.5 (calc) — ABNORMAL HIGH
Triglycerides: 284 mg/dL — ABNORMAL HIGH

## 2021-05-18 MED ORDER — METHYLPHENIDATE HCL 10 MG PO TABS: 10.0000 mg | ORAL_TABLET | Freq: Two times a day (BID) | ORAL | 0 refills | Status: DC

## 2021-05-18 MED ORDER — ALPRAZOLAM 0.5 MG PO TABS: ORAL_TABLET | ORAL | 0 refills | Status: DC

## 2021-05-18 NOTE — Progress Notes (Signed)
Hi Kristina Martinez, LDL cholesterol went up to 170.  This is the bad cholesterol.  Your cardiovascular risk score is still overall relatively low but just really encourage you to work on healthy diet and regular exercise to reduce that number.  The 10-year ASCVD risk score (Arnett DK, et al., 2019) is: 3.4%   Values used to calculate the score:     Age: 50 years     Sex: Female     Is Non-Hispanic African American: No     Diabetic: No     Tobacco smoker: No     Systolic Blood Pressure: 283 mmHg     Is BP treated: No     HDL Cholesterol: 40 mg/dL     Total Cholesterol: 261 mg/dL

## 2021-05-18 NOTE — Progress Notes (Signed)
Subjective:     Kristina Martinez is a 50 y.o. female and is here for a comprehensive physical exam. The patient reports no problems. She has been taking her celexa every other day as she has been waning her medication.  She was recently seen for severe GI symptoms and says she is gradually been getting a little better.  She is not currently exercising.  She also wanted to discuss getting off of birth control she is only been taking it because she needed to reduce her heavy bleeding so that she was not becoming anemic.  But she wanted to know what other options she had.  He is also started to have some occasional hot flashes.  Social History   Socioeconomic History   Marital status: Married    Spouse name: Not on file   Number of children: Not on file   Years of education: Not on file   Highest education level: Not on file  Occupational History   Not on file  Tobacco Use   Smoking status: Never   Smokeless tobacco: Never  Substance and Sexual Activity   Alcohol use: Yes    Comment: 1/ in 6 months   Drug use: No   Sexual activity: Not on file  Other Topics Concern   Not on file  Social History Narrative   [place]s [employer] [place]    Social Determinants of Health   Financial Resource Strain: Not on file  Food Insecurity: Not on file  Transportation Needs: Not on file  Physical Activity: Not on file  Stress: Not on file  Social Connections: Not on file  Intimate Partner Violence: Not on file   Health Maintenance  Topic Date Due   Pneumococcal Vaccine 71-71 Years old (1 - PCV) Never done   Zoster Vaccines- Shingrix (1 of 2) Never done   COVID-19 Vaccine (4 - Booster for Pfizer series) 06/23/2020   INFLUENZA VACCINE  01/26/2021   MAMMOGRAM  03/13/2022   PAP SMEAR-Modifier  03/10/2025   COLONOSCOPY (Pts 45-99yrs Insurance coverage will need to be confirmed)  05/25/2027   TETANUS/TDAP  06/28/2029   Hepatitis C Screening  Completed   HIV Screening  Completed    HPV VACCINES  Aged Out    The following portions of the patient's history were reviewed and updated as appropriate: allergies, current medications, past family history, past medical history, past social history, past surgical history, and problem list.  Review of Systems A comprehensive review of systems was negative.   Objective:    BP 138/77   Pulse 65   Ht 5\' 7"  (1.702 m)   Wt 241 lb (109.3 kg)   SpO2 99%   BMI 37.75 kg/m  General appearance: alert, cooperative, and appears stated age Head: Normocephalic, without obvious abnormality, atraumatic Eyes:  conj clear EOMi, PEERLA Ears: normal TM's and external ear canals both ears Nose: Nares normal. Septum midline. Mucosa normal. No drainage or sinus tenderness. Throat: lips, mucosa, and tongue normal; teeth and gums normal Neck: no adenopathy, no carotid bruit, no JVD, supple, symmetrical, trachea midline, and thyroid not enlarged, symmetric, no tenderness/mass/nodules Back: symmetric, no curvature. ROM normal. No CVA tenderness. Lungs: clear to auscultation bilaterally Breasts: normal appearance, no masses or tenderness Heart: regular rate and rhythm, S1, S2 normal, no murmur, click, rub or gallop Abdomen: soft, non-tender; bowel sounds normal; no masses,  no organomegaly Extremities: extremities normal, atraumatic, no cyanosis or edema Pulses: 2+ and symmetric Skin: Skin color, texture, turgor  normal. No rashes or lesions Lymph nodes: Cervical, supraclavicular, and axillary nodes normal. Neurologic: Grossly normal    Assessment:    Healthy female exam.      Plan:     See After Visit Summary for Counseling Recommendations  Keep up a regular exercise program and make sure you are eating a healthy diet Try to eat 4 servings of dairy a day, or if you are lactose intolerant take a calcium with vitamin D daily.  Your vaccines are up to date.  Due for Mammo next Month   Problem List Items Addressed This Visit        Other   Chronic anemia    Could consider an IUD placed.  But she is also now 58 so it might be reasonable to come off of her birth control in the next year just to see if she still having periods.  She can always restart it or switch to an alternative form such as a patch etc.      Attention deficit disorder   Relevant Medications   methylphenidate (RITALIN) 10 MG tablet   Other Visit Diagnoses     Wellness examination    -  Primary   Relevant Orders   Lipid Panel w/reflex Direct LDL      Keep up a regular exercise program and make sure you are eating a healthy diet Try to eat 4 servings of dairy a day, or if you are lactose intolerant take a calcium with vitamin D daily.  Your vaccines are up to date.

## 2021-05-18 NOTE — Assessment & Plan Note (Signed)
Could consider an IUD placed.  But she is also now 26 so it might be reasonable to come off of her birth control in the next year just to see if she still having periods.  She can always restart it or switch to an alternative form such as a patch etc.

## 2021-05-18 NOTE — Addendum Note (Signed)
Addended by: Teddy Spike on: 05/18/2021 10:10 AM   Modules accepted: Orders

## 2021-05-20 LAB — FUNGAL STAIN
FUNGAL SMEAR:: NONE SEEN
MICRO NUMBER:: 12666685
SPECIMEN QUALITY:: ADEQUATE

## 2021-05-25 MED ORDER — TRIAMCINOLONE ACETONIDE 0.1 % EX CREA: 1.0000 "application " | TOPICAL_CREAM | Freq: Two times a day (BID) | CUTANEOUS | 0 refills | Status: DC

## 2021-05-25 NOTE — Progress Notes (Signed)
Hi Latarra, the skin scraping was negative for any type of fungal elements.  I would like to try a topical steroid on the area to see if we can get it to clear up after about 2 to 3-week.  New prescription sent to pharmacy.  Let me know if this is not resolving.

## 2021-05-25 NOTE — Addendum Note (Signed)
Addended by: Beatrice Lecher D on: 05/25/2021 04:15 PM   Modules accepted: Orders

## 2021-06-05 ENCOUNTER — Ambulatory Visit: Payer: 59 | Admitting: Medical-Surgical

## 2021-06-15 ENCOUNTER — Other Ambulatory Visit: Payer: Self-pay

## 2021-06-15 ENCOUNTER — Encounter: Payer: Self-pay | Admitting: Emergency Medicine

## 2021-06-15 ENCOUNTER — Emergency Department (INDEPENDENT_AMBULATORY_CARE_PROVIDER_SITE_OTHER)
Admission: EM | Admit: 2021-06-15 | Discharge: 2021-06-15 | Disposition: A | Discharge status: Home / Self Care | Payer: 59 | Source: Home / Self Care

## 2021-06-15 DIAGNOSIS — J019 Acute sinusitis, unspecified: Secondary | ICD-10-CM | POA: Diagnosis not present

## 2021-06-15 DIAGNOSIS — H1033 Unspecified acute conjunctivitis, bilateral: Secondary | ICD-10-CM | POA: Diagnosis not present

## 2021-06-15 MED ORDER — POLYMYXIN B-TRIMETHOPRIM 10000-0.1 UNIT/ML-% OP SOLN: 1.0000 [drp] | OPHTHALMIC | 0 refills | Status: DC

## 2021-06-15 MED ORDER — AMOXICILLIN-POT CLAVULANATE 875-125 MG PO TABS: 1.0000 | ORAL_TABLET | Freq: Two times a day (BID) | ORAL | 0 refills | Status: DC

## 2021-06-15 MED ORDER — PROMETHAZINE-DM 6.25-15 MG/5ML PO SYRP: 5.0000 mL | ORAL_SOLUTION | Freq: Four times a day (QID) | ORAL | 0 refills | Status: DC | PRN

## 2021-06-15 NOTE — ED Provider Notes (Signed)
[facility]    CSN: [id number] Arrival date & time: 06/15/21  0915      History   Chief Complaint Chief Complaint  Patient presents with   Cough    HPI Kristina Martinez is a 50 y.o. female.   Patient presents with concerns of feeling unwell for a week. She reports fatigue, sinus pressure, nasal congestion, sore throat, productive cough, and bilateral eye redness, irritation, and drainage. She states it had seemed to get better but then over the weekend worsened. The patient denies known fever. She reports thick discharge and crusting in her eyes upon awakening. She does not wear contacts. The patient has tried various OTC medicine without much improvement. She denies known sick contacts.   The history is provided by the patient.  Cough Associated symptoms: ear pain, eye discharge and sore throat   Associated symptoms: no chest pain, no fever, no headaches, no myalgias, no rash, no rhinorrhea and no shortness of breath    Past Medical History:  Diagnosis Date   Achilles tendon injury    ADD (attention deficit disorder)    Depression    IBS (irritable bowel syndrome)    Migraine    Obese    Ovarian cyst    PMDD (premenstrual dysphoric disorder)    Vaso-vagal reaction     Patient Active Problem List   Diagnosis Date Noted   Spinal stenosis of lumbar region with neurogenic claudication 06/16/2020   Lumbar spondylosis 06/16/2020   Vasovagal syncope 06/16/2020   SI (sacroiliac) joint inflammation ([place]) 03/10/2020   Lumbar radiculopathy 06/26/2019   Pars defect with spondylolisthesis 06/26/2019   Class 2 obesity due to excess calories without serious comorbidity with body mass index (BMI) of 38.0 to 38.9 in adult 06/12/2019   History of nephrolithiasis 01/23/2018   Reactive thrombocytosis 09/28/2017   Gastroesophageal reflux disease without esophagitis 09/08/2017   Irritable bowel syndrome with diarrhea 09/08/2017   Eosinophil count raised 08/17/2017   Iron  deficiency anemia due to chronic blood loss 08/17/2017   Dysfunction of left rotator cuff 09/29/2016   Pulmonary nodule, right 06/02/2016   Chronic anemia 04/23/2016   Hydronephrosis of right kidney 04/23/2016   Primary osteoarthritis of left knee 01/09/2015   Rosacea 11/01/2013   Neuropathy of right ulnar nerve at wrist 06/05/2013   HLD (hyperlipidemia) 04/16/2013   Plantar fasciitis, bilateral 12/08/2012   Anxiety 03/10/2012   Major depressive disorder, recurrent episode, moderate ([place]) 07/14/2011   Cervical radiculopathy 01/23/2011   CARDIAC MURMUR 07/01/2010   NONSPECIFIC MESENTERIC LYMPHADENITIS 01/29/2010   NIGHT SWEATS 01/28/2010   BACK PAIN 01/12/2010   Divide DISEASE, LUMBAR 06/24/2009   SINUSITIS, CHRONIC 06/04/2009   Abnormal levels of other serum enzymes 03/19/2009   Diaphragmatic hernia 12/26/2008   Other and unspecified ovarian cyst 12/17/2008   IFG (impaired fasting glucose) 09/30/2008   Attention deficit disorder 10/31/2007    Past Surgical History:  Procedure Laterality Date   LAPAROSCOPIC CHOLECYSTECTOMY  2011    OB History   No obstetric history on file.      Home Medications    Prior to Admission medications   Medication Sig Start Date End Date Taking? Authorizing Provider  ALPRAZolam Duanne Moron) 0.5 MG tablet TAKE 1 TABLET(0.5 MG) BY MOUTH DAILY AS NEEDED FOR ANXIETY 05/18/21  Yes Hali Marry, MD  amoxicillin-clavulanate (AUGMENTIN) 875-125 MG tablet Take 1 tablet by mouth every 12 (twelve) hours. 06/15/21  Yes Stephine Langbehn L, PA  citalopram (CELEXA) 10 MG tablet TAKE 1  TABLET BY MOUTH  DAILY 01/14/21  Yes Hali Marry, MD  levonorgestrel-ethinyl estradiol (SEASONALE) 0.15-0.03 MG tablet TAKE 1 TABLET BY MOUTH  DAILY 04/10/21  Yes Hali Marry, MD  methylphenidate (RITALIN) 10 MG tablet Take 1 tablet (10 mg total) by mouth 2 (two) times daily. 05/18/21  Yes Hali Marry, MD  omeprazole (PRILOSEC) 40 MG capsule Take by  mouth. 08/08/17  Yes [provider]  promethazine-dextromethorphan (PROMETHAZINE-DM) 6.25-15 MG/5ML syrup Take 5 mLs by mouth 4 (four) times daily as needed for cough. 06/15/21  Yes Jennah Satchell L, PA  triamcinolone cream (KENALOG) 0.1 % Apply 1 application topically 2 (two) times daily. 05/25/21  Yes Hali Marry, MD  trimethoprim-polymyxin b (POLYTRIM) ophthalmic solution Place 1 drop into both eyes every 4 (four) hours. 06/15/21  Yes Abner Greenspan, Kamaal Cast L, PA    Family History Family History  Problem Relation Age of Onset   Skin cancer Mother        skin   Prostate cancer Father    Heart disease Father        AMI   Heart failure Father    Stroke Brother 107   Breast cancer Maternal Grandmother    Uterine cancer Maternal Grandmother        ? cervix   Breast cancer Paternal Grandmother     Social History Social History   Tobacco Use   Smoking status: Never   Smokeless tobacco: Never  Substance Use Topics   Alcohol use: Yes    Comment: 1/ in 6 months   Drug use: No     Allergies   Patient has no known allergies.   Review of Systems Review of Systems  Constitutional:  Positive for fatigue. Negative for fever.  HENT:  Positive for congestion, ear pain, sinus pressure and sore throat. Negative for rhinorrhea.   Eyes:  Positive for discharge, redness and itching. Negative for visual disturbance.  Respiratory:  Positive for cough. Negative for chest tightness and shortness of breath.   Cardiovascular:  Negative for chest pain.  Gastrointestinal:  Negative for nausea and vomiting.  Musculoskeletal:  Negative for myalgias.  Skin:  Negative for rash.  Neurological:  Negative for dizziness and headaches.    Physical Exam Triage Vital Signs ED Triage Vitals  Enc Vitals Group     BP 06/15/21 0923 (!) 155/96     Pulse Rate 06/15/21 0923 (!) 107     Resp --      Temp 06/15/21 0923 99 F (37.2 C)     Temp Source 06/15/21 0923 Oral     SpO2 06/15/21 0923 96 %      Weight 06/15/21 0924 240 lb (108.9 kg)     Height 06/15/21 0924 5\' 6"  (1.676 m)     Head Circumference --      Peak Flow --      Pain Score 06/15/21 0924 0     Pain Loc --      Pain Edu? --      Excl. in [place]? --    No data found.  Updated Vital Signs BP (!) 155/96 (BP Location: Right Arm)    Pulse (!) 107    Temp 99 F (37.2 C) (Oral)    Ht 5\' 6"  (1.676 m)    Wt 240 lb (108.9 kg)    SpO2 96%    BMI 38.74 kg/m   Visual Acuity Right Eye Distance:   Left Eye Distance:   Bilateral Distance:  Right Eye Near:   Left Eye Near:    Bilateral Near:     Physical Exam Vitals and nursing note reviewed.  Constitutional:      General: She is not in acute distress. HENT:     Head: Normocephalic.     Right Ear: Tympanic membrane, ear canal and external ear normal.     Left Ear: Tympanic membrane, ear canal and external ear normal.     Nose: Congestion present. No rhinorrhea.     Mouth/Throat:     Mouth: Mucous membranes are moist.     Pharynx: Oropharynx is clear.  Eyes:     General: Lids are normal.     Extraocular Movements: Extraocular movements intact.     Conjunctiva/sclera:     Right eye: Right conjunctiva is injected.     Left eye: Left conjunctiva is injected.     Comments: Bilateral diffuse mild injection. Watery discharge.   Cardiovascular:     Rate and Rhythm: Normal rate and regular rhythm.     Heart sounds: Normal heart sounds.  Pulmonary:     Effort: Pulmonary effort is normal.     Breath sounds: Normal breath sounds.  Musculoskeletal:     Cervical back: Normal range of motion.  Lymphadenopathy:     Cervical: Cervical adenopathy present.  Skin:    Findings: No rash.  Neurological:     Mental Status: She is alert.     Gait: Gait normal.  Psychiatric:        Mood and Affect: Mood normal.     UC Treatments / Results  Labs (all labs ordered are listed, but only abnormal results are displayed) Labs Reviewed - No data to display  EKG   Radiology No  results found.  Procedures Procedures (including critical care time)  Medications Ordered in UC Medications - No data to display  Initial Impression / Assessment and Plan / UC Course  I have reviewed the triage vital signs and the nursing notes.  Pertinent labs & imaging results that were available during my care of the patient were reviewed by me and considered in my medical decision making (see chart for details).     Sx and double sickening concerning for sinusitis. Empiric abx. Suspect conjunctivitis viral in nature but will cover with abx drops. Sx tx for cough. Reassurance and return/ER precautions discussed.   E/M: 1 acute uncomplicated illness, no data, moderate risk due to prescription management  Final Clinical Impressions(s) / UC Diagnoses   Final diagnoses:  Acute non-recurrent sinusitis, unspecified location  Acute conjunctivitis of both eyes, unspecified acute conjunctivitis type     Discharge Instructions      Take Augmentin as prescribed and use eye drops as prescribed to treat sinusitis and conjunctivitis. Take cough medication as prescribed as needed - may cause drowsiness. Can continue with Tylenol and/or ibuprofen as well. Rest and keep hydrated. Follow-up with PCP if no improvement after completing antibiotics. Go to the ER if develop difficulty breathing.     ED Prescriptions     Medication Sig Dispense Auth. Provider   amoxicillin-clavulanate (AUGMENTIN) 875-125 MG tablet Take 1 tablet by mouth every 12 (twelve) hours. 14 tablet Boni Maclellan L, PA   trimethoprim-polymyxin b (POLYTRIM) ophthalmic solution Place 1 drop into both eyes every 4 (four) hours. 10 mL Abner Greenspan, Delmo Martinez L, PA   promethazine-dextromethorphan (PROMETHAZINE-DM) 6.25-15 MG/5ML syrup Take 5 mLs by mouth 4 (four) times daily as needed for cough. 118 mL Abner Greenspan, Clovia Reine L, [place]  PDMP not reviewed this encounter.   Delsa Sale, [place] 06/15/21 [phone]

## 2021-06-15 NOTE — ED Triage Notes (Signed)
Patient c/o productive cough, sore throat, watery eyes x 1 week.  Patient has taken Advil Cold & Sinus and Dayquil/Nyquil.  Patient is vaccinated for COVID.

## 2021-06-15 NOTE — Discharge Instructions (Signed)
Take Augmentin as prescribed and use eye drops as prescribed to treat sinusitis and conjunctivitis. Take cough medication as prescribed as needed - may cause drowsiness. Can continue with Tylenol and/or ibuprofen as well. Rest and keep hydrated. Follow-up with PCP if no improvement after completing antibiotics. Go to the ER if develop difficulty breathing.

## 2021-06-30 ENCOUNTER — Other Ambulatory Visit: Payer: Self-pay | Admitting: Family Medicine

## 2021-07-19 ENCOUNTER — Other Ambulatory Visit: Payer: Self-pay | Admitting: Family Medicine

## 2021-07-20 NOTE — Telephone Encounter (Signed)
Last written 05/18/2021 #15 no refills Last appt 05/18/2021

## 2021-09-02 ENCOUNTER — Other Ambulatory Visit: Payer: Self-pay | Admitting: Family Medicine

## 2021-09-02 DIAGNOSIS — F988 Other specified behavioral and emotional disorders with onset usually occurring in childhood and adolescence: Secondary | ICD-10-CM

## 2021-09-02 MED ORDER — METHYLPHENIDATE HCL 10 MG PO TABS: 10.0000 mg | ORAL_TABLET | Freq: Two times a day (BID) | ORAL | 0 refills | Status: DC

## 2021-09-02 NOTE — Telephone Encounter (Signed)
Meds ordered this encounter  ?Medications  ? ALPRAZolam (XANAX) 0.5 MG tablet  ?  Sig: TAKE 1 TABLET BY MOUTH DAILY AS  NEEDED FOR ANXIETY. Please use sparingly  ?  Dispense:  15 tablet  ?  Refill:  0  ? ?We can also consider increasing her citalopram as well. That could really help. Try to use xanax sparingly ?

## 2021-09-02 NOTE — Telephone Encounter (Signed)
Is call patient and check on her.  She does not usually request this medication this often so just wanted to make sure that everything was okay and if we need to make other changes we certainly can. ?

## 2021-09-15 ENCOUNTER — Other Ambulatory Visit: Payer: Self-pay

## 2021-09-15 ENCOUNTER — Ambulatory Visit (INDEPENDENT_AMBULATORY_CARE_PROVIDER_SITE_OTHER): Payer: 59 | Admitting: Family Medicine

## 2021-09-15 ENCOUNTER — Encounter: Payer: Self-pay | Admitting: Family Medicine

## 2021-09-15 ENCOUNTER — Ambulatory Visit (INDEPENDENT_AMBULATORY_CARE_PROVIDER_SITE_OTHER): Payer: 59

## 2021-09-15 VITALS — BP 152/90 | HR 97 | Resp 18 | Ht 66.0 in | Wt 233.0 lb

## 2021-09-15 DIAGNOSIS — R1030 Lower abdominal pain, unspecified: Secondary | ICD-10-CM

## 2021-09-15 DIAGNOSIS — N951 Menopausal and female climacteric states: Secondary | ICD-10-CM | POA: Diagnosis not present

## 2021-09-15 LAB — POCT URINALYSIS DIP (CLINITEK)
Bilirubin, UA: NEGATIVE
Blood, UA: NEGATIVE
Glucose, UA: NEGATIVE mg/dL
Ketones, POC UA: NEGATIVE mg/dL
Leukocytes, UA: NEGATIVE
Nitrite, UA: NEGATIVE
POC PROTEIN,UA: NEGATIVE
Spec Grav, UA: 1.01 (ref 1.010–1.025)
Urobilinogen, UA: 0.2 E.U./dL
pH, UA: 6 (ref 5.0–8.0)

## 2021-09-15 MED ORDER — VENLAFAXINE HCL ER 37.5 MG PO CP24: 37.5000 mg | ORAL_CAPSULE | Freq: Every day | ORAL | 0 refills | Status: DC

## 2021-09-15 NOTE — Progress Notes (Deleted)
? ?Established Patient Office Visit ? ?Subjective:  ?Patient ID: Kristina Martinez, female    DOB: 1971/05/04  Age: 51 y.o. MRN: [id number] ? ?CC:  ?Chief Complaint  ?Patient presents with  ? Groin Pain  ?  Patient states pain radiates up towards lower abdomen since 06/2021. Patient states the pain is a pulling sensation.   ? Spasms  ?  Back Spasms for several months.   ? ? ?HPI ?Kristina Martinez presents for Patient states pain radiates up towards lower abdomen since 06/2021. Patient states the pain is a pulling sensation. Says started a few weeks ago after cleaning out her dads house.  Her stepmother passed away and and going to help her dad realized how severe his dementia was.  She they ended up having to clean out the home and put him in a memory care unit.  He is doing okay but since then she has had a sensation in the groin that she says starts at the bottom most like the bottom of a triangle and then radiates upward.  She says she feels a tugging sensation when she walks particularly on the left side.  She has been a little bit more constipated lately.  No prior history of hernia and she has not noticed any recent bulges.  She does have a very small umbilical hernia.  The only abdominal surgery she has had is a cholecystectomy.  She denies any urinary symptoms. ? ?He also reports she has been getting some upper back pain occasionally when she bends forward and then stands up she will most feel like a tightening or catch that is painful or spasm. ? ?She is now postmenopausal.  She also wanted to discuss treatment for the symptoms she is particularly struggling with hot flashes and irritability.  She is trying to work on losing weight but admits she is not eating the best.  She has not been exercising regularly. ? ?Past Medical History:  ?Diagnosis Date  ? Achilles tendon injury   ? ADD (attention deficit disorder)   ? Depression   ? IBS (irritable bowel syndrome)   ? Migraine   ? Obese   ? Ovarian cyst   ? PMDD  (premenstrual dysphoric disorder)   ? Vaso-vagal reaction   ? ? ?Past Surgical History:  ?Procedure Laterality Date  ? LAPAROSCOPIC CHOLECYSTECTOMY  2011  ? ? ?Family History  ?Problem Relation Age of Onset  ? Skin cancer Mother   ?     skin  ? Prostate cancer Father   ? Heart disease Father   ?     AMI  ? Heart failure Father   ? Stroke Brother 40  ? Breast cancer Maternal Grandmother   ? Uterine cancer Maternal Grandmother   ?     ? cervix  ? Breast cancer Paternal Grandmother   ? ? ?Social History  ? ?Socioeconomic History  ? Marital status: Married  ?  Spouse name: Not on file  ? Number of children: Not on file  ? Years of education: Not on file  ? Highest education level: Not on file  ?Occupational History  ? Not on file  ?Tobacco Use  ? Smoking status: Never  ? Smokeless tobacco: Never  ?Substance and Sexual Activity  ? Alcohol use: Yes  ?  Comment: 1/ in 6 months  ? Drug use: No  ? Sexual activity: Not on file  ?Other Topics Concern  ? Not on file  ?Social History Narrative  ? 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   ? ?Social Determinants of Health  ? ?Financial Resource Strain: Not on file  ?Food Insecurity: Not on file  ?Transportation Needs: Not on file  ?Physical Activity: Not on file  ?Stress: Not on file  ?Social Connections: Not on file  ?Intimate Partner Violence: Not on file  ? ? ?Outpatient Medications Prior to Visit  ?Medication Sig Dispense Refill  ? ALPRAZolam (XANAX) 0.5 MG tablet TAKE 1 TABLET BY MOUTH DAILY AS  NEEDED FOR ANXIETY. Please use sparingly 15 tablet 0  ? methylphenidate (RITALIN) 10 MG tablet Take 1 tablet (10 mg total) by mouth 2 (two) times daily. 180 tablet 0  ? omeprazole (PRILOSEC) 40 MG capsule Take 20 mg by mouth daily.    ? triamcinolone cream (KENALOG) 0.1 % Apply 1 application topically 2 (two) times daily. 30 g 0  ? amoxicillin-clavulanate (AUGMENTIN) 875-125 MG tablet Take 1 tablet by mouth every 12 (twelve) hours. 14 tablet 0  ? citalopram (CELEXA) 10 MG tablet TAKE 1  TABLET BY MOUTH  DAILY (Patient not taking: Reported on 09/15/2021) 90 tablet 0  ? levonorgestrel-ethinyl estradiol (SEASONALE) 0.15-0.03 MG tablet TAKE 1 TABLET BY MOUTH  DAILY (Patient not taking: Reported on 09/15/2021) 91 tablet 3  ? promethazine-dextromethorphan (PROMETHAZINE-DM) 6.25-15 MG/5ML syrup Take 5 mLs by mouth 4 (four) times daily as needed for cough. 118 mL 0  ? trimethoprim-polymyxin b (POLYTRIM) ophthalmic solution Place 1 drop into both eyes every 4 (four) hours. 10 mL 0  ? ?No facility-administered medications prior to visit.  ? ? ?No Known Allergies ? ?ROS ?Review of Systems ? ?  ?Objective:  ?  ?Physical Exam ? ?BP (!) 152/90 (BP Location: Left Arm)   Pulse 97   Resp 18   Ht $R'5\' 6"'zs$  (1.676 m)   Wt 233 lb (105.7 kg)   LMP 03/09/2020 (Exact Date)   SpO2 99%   BMI 37.61 kg/m?  ?Wt Readings from Last 3 Encounters:  ?09/15/21 233 lb (105.7 kg)  ?06/15/21 240 lb (108.9 kg)  ?05/18/21 241 lb (109.3 kg)  ? ? ? ?There are no preventive care reminders to display for this patient. ? ?There are no preventive care reminders to display for this patient. ? ?Lab Results  ?Component Value Date  ? TSH 1.163 03/30/2012  ? ?Lab Results  ?Component Value Date  ? WBC 9.4 05/05/2021  ? HGB 14.2 05/05/2021  ? HCT 42.6 05/05/2021  ? MCV 82.9 05/05/2021  ? PLT 395 05/05/2021  ? ?Lab Results  ?Component Value Date  ? NA 136 05/05/2021  ? K 4.0 05/05/2021  ? CO2 24 05/05/2021  ? GLUCOSE 98 05/05/2021  ? BUN 11 05/05/2021  ? CREATININE 0.74 05/05/2021  ? BILITOT 0.5 05/05/2021  ? ALKPHOS 106 02/22/2017  ? AST 15 05/05/2021  ? ALT 10 05/05/2021  ? PROT 7.1 05/05/2021  ? ALBUMIN 3.6 02/22/2017  ? CALCIUM 9.5 05/05/2021  ? EGFR 97 05/05/2021  ? ?Lab Results  ?Component Value Date  ? CHOL 261 (H) 05/18/2021  ? ?Lab Results  ?Component Value Date  ? HDL 40 (L) 05/18/2021  ? ?Lab Results  ?Component Value Date  ? [place] 170 (H) 05/18/2021  ? ?Lab Results  ?Component Value Date  ? TRIG 284 (H) 05/18/2021  ? ?Lab Results   ?Component Value Date  ? CHOLHDL 6.5 (H) 05/18/2021  ? ?Lab Results  ?Component Value Date  ? HGBA1C 5.6 06/16/2020  ? ? ?  ?Assessment & Plan:  ? ?  Problem List Items Addressed This Visit   ? ?  ? Other  ? Menopausal symptoms - Primary  ?  We discussed options including hormonal and nonhormonal treatment.  We discussed potential side effects of hormonal replacement including increased risk of breast cancer cardiovascular disease.  She denies any first-degree relative with breast cancer.  She says her dad was diagnosed with coronary artery disease in his 51s.  She denies a personal history of breast issues or heart disease.  Recommend starting with a nonhormonal option we will start with Effexor which I think could do well to hit to hot flashes and irritability follow-up in 6 to 8 weeks to make sure that she is tolerating it well.  We will also recheck her blood pressure at that time. ? ?F/U 6-8 weeks.   ?  ?  ? Relevant Medications  ? venlafaxine XR (EFFEXOR XR) 37.5 MG 24 hr capsule  ? ?Other Visit Diagnoses   ? ? Lower abdominal pain      ? Relevant Orders  ? DG Abd 1 View  ? US Pelvic Complete With Transvaginal  ? Hot Martinez, menopausal      ? ?  ? ?Lower abdominal pain-unclear etiology at this point.  She has been a little bit more constipated which I do think it could be contributing.  She is now postmenopausal and amenorrheic.  We will move forward with KUB to look at the bowel and a pelvic ultrasound to look at the pelvic organs to rule out cysts, lesions etc.  We will call with results once available.  Consider CT abdomen pelvis for further work-up if not improving. ? ?Meds ordered this encounter  ?Medications  ? venlafaxine XR (EFFEXOR XR) 37.5 MG 24 hr capsule  ?  Sig: Take 1 capsule (37.5 mg total) by mouth daily with breakfast.  ?  Dispense:  90 capsule  ?  Refill:  0  ? ? ?Follow-up: No follow-ups on file.  ? ? ?Beatrice Lecher, MD ?

## 2021-09-15 NOTE — Addendum Note (Signed)
Addended by: Izora Gala on: 09/15/2021 04:33 PM ? ? Modules accepted: Orders ? ?

## 2021-09-15 NOTE — Progress Notes (Signed)
? ?Established Patient Office Visit ? ?Subjective:  ?Patient ID: Kristina Martinez, female    DOB: 1971/05/04  Age: 51 y.o. MRN: [id number] ? ?CC:  ?Chief Complaint  ?Patient presents with  ? Groin Pain  ?  Patient states pain radiates up towards lower abdomen since 06/2021. Patient states the pain is a pulling sensation.   ? Spasms  ?  Back Spasms for several months.   ? ? ?HPI ?Kristina Martinez presents for Patient states pain radiates up towards lower abdomen since 06/2021. Patient states the pain is a pulling sensation. Says started a few weeks ago after cleaning out her dads house.  Her stepmother passed away and and going to help her dad realized how severe his dementia was.  She they ended up having to clean out the home and put him in a memory care unit.  He is doing okay but since then she has had a sensation in the groin that she says starts at the bottom most like the bottom of a triangle and then radiates upward.  She says she feels a tugging sensation when she walks particularly on the left side.  She has been a little bit more constipated lately.  No prior history of hernia and she has not noticed any recent bulges.  She does have a very small umbilical hernia.  The only abdominal surgery she has had is a cholecystectomy.  She denies any urinary symptoms. ? ?He also reports she has been getting some upper back pain occasionally when she bends forward and then stands up she will most feel like a tightening or catch that is painful or spasm. ? ?She is now postmenopausal.  She also wanted to discuss treatment for the symptoms she is particularly struggling with hot flashes and irritability.  She is trying to work on losing weight but admits she is not eating the best.  She has not been exercising regularly. ? ?Past Medical History:  ?Diagnosis Date  ? Achilles tendon injury   ? ADD (attention deficit disorder)   ? Depression   ? IBS (irritable bowel syndrome)   ? Migraine   ? Obese   ? Ovarian cyst   ? PMDD  (premenstrual dysphoric disorder)   ? Vaso-vagal reaction   ? ? ?Past Surgical History:  ?Procedure Laterality Date  ? LAPAROSCOPIC CHOLECYSTECTOMY  2011  ? ? ?Family History  ?Problem Relation Age of Onset  ? Skin cancer Mother   ?     skin  ? Prostate cancer Father   ? Heart disease Father   ?     AMI  ? Heart failure Father   ? Stroke Brother 40  ? Breast cancer Maternal Grandmother   ? Uterine cancer Maternal Grandmother   ?     ? cervix  ? Breast cancer Paternal Grandmother   ? ? ?Social History  ? ?Socioeconomic History  ? Marital status: Married  ?  Spouse name: Not on file  ? Number of children: Not on file  ? Years of education: Not on file  ? Highest education level: Not on file  ?Occupational History  ? Not on file  ?Tobacco Use  ? Smoking status: Never  ? Smokeless tobacco: Never  ?Substance and Sexual Activity  ? Alcohol use: Yes  ?  Comment: 1/ in 6 months  ? Drug use: No  ? Sexual activity: Not on file  ?Other Topics Concern  ? Not on file  ?Social History Narrative  ? 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   ? ?Social Determinants of Health  ? ?Financial Resource Strain: Not on file  ?Food Insecurity: Not on file  ?Transportation Needs: Not on file  ?Physical Activity: Not on file  ?Stress: Not on file  ?Social Connections: Not on file  ?Intimate Partner Violence: Not on file  ? ? ?Outpatient Medications Prior to Visit  ?Medication Sig Dispense Refill  ? ALPRAZolam (XANAX) 0.5 MG tablet TAKE 1 TABLET BY MOUTH DAILY AS  NEEDED FOR ANXIETY. Please use sparingly 15 tablet 0  ? methylphenidate (RITALIN) 10 MG tablet Take 1 tablet (10 mg total) by mouth 2 (two) times daily. 180 tablet 0  ? omeprazole (PRILOSEC) 40 MG capsule Take 20 mg by mouth daily.    ? triamcinolone cream (KENALOG) 0.1 % Apply 1 application topically 2 (two) times daily. 30 g 0  ? amoxicillin-clavulanate (AUGMENTIN) 875-125 MG tablet Take 1 tablet by mouth every 12 (twelve) hours. 14 tablet 0  ? citalopram (CELEXA) 10 MG tablet TAKE 1  TABLET BY MOUTH  DAILY (Patient not taking: Reported on 09/15/2021) 90 tablet 0  ? levonorgestrel-ethinyl estradiol (SEASONALE) 0.15-0.03 MG tablet TAKE 1 TABLET BY MOUTH  DAILY (Patient not taking: Reported on 09/15/2021) 91 tablet 3  ? promethazine-dextromethorphan (PROMETHAZINE-DM) 6.25-15 MG/5ML syrup Take 5 mLs by mouth 4 (four) times daily as needed for cough. 118 mL 0  ? trimethoprim-polymyxin b (POLYTRIM) ophthalmic solution Place 1 drop into both eyes every 4 (four) hours. 10 mL 0  ? ?No facility-administered medications prior to visit.  ? ? ?No Known Allergies ? ?ROS ?Review of Systems ? ?  ?Objective:  ?  ?Physical Exam ?Physical Exam ?Vitals and nursing note reviewed.  ?Constitutional:   ?   Appearance: She is well-developed.  ?HENT:  ?   Head: Normocephalic and atraumatic.  ?Eyes:  ?   Conjunctiva/sclera: Conjunctivae normal.  ?Cardiovascular:  ?   Rate and Rhythm: Normal rate and regular rhythm.  ?   Heart sounds: Normal heart sounds.  ?Pulmonary:  ?   Effort: Pulmonary effort is normal.  ?   Breath sounds: Normal breath sounds.  ?Abdominal:  ?   Comments: Mildly tender in the right lower quadrant and left lower quadrant.  Nontender in the suprapubic area.  ?Skin: ?   General: Skin is warm and dry.  ?   Coloration: Skin is not pale.  ?Neurological:  ?   Mental Status: She is alert and oriented to person, place, and time.  ?Psychiatric:     ?   Behavior: Behavior normal.  ? ? ? ?BP (!) 152/90 (BP Location: Left Arm)   Pulse 97   Resp 18   Ht _0  (1.676 m)   Wt 233 lb (105.7 kg)   LMP 03/09/2020 (Exact Date)   SpO2 99%   BMI 37.61 kg/m?  ?Wt Readings from Last 3 Encounters:  ?09/15/21 233 lb (105.7 kg)  ?06/15/21 240 lb (108.9 kg)  ?05/18/21 241 lb (109.3 kg)  ? ? ? ?There are no preventive care reminders to display for this patient. ? ?There are no preventive care reminders to display for this patient. ? ?Lab Results  ?Component Value Date  ? TSH 1.163 03/30/2012  ? ?Lab Results  ?Component Value  Date  ? WBC 9.4 05/05/2021  ? HGB 14.2 05/05/2021  ? HCT 42.6 05/05/2021  ? MCV 82.9 05/05/2021  ? PLT 395 05/05/2021  ? ?Lab Results  ?Component Value Date  ? NA 136 05/05/2021  ?  K 4.0 05/05/2021  ? CO2 24 05/05/2021  ? GLUCOSE 98 05/05/2021  ? BUN 11 05/05/2021  ? CREATININE 0.74 05/05/2021  ? BILITOT 0.5 05/05/2021  ? ALKPHOS 106 02/22/2017  ? AST 15 05/05/2021  ? ALT 10 05/05/2021  ? PROT 7.1 05/05/2021  ? ALBUMIN 3.6 02/22/2017  ? CALCIUM 9.5 05/05/2021  ? EGFR 97 05/05/2021  ? ?Lab Results  ?Component Value Date  ? CHOL 261 (H) 05/18/2021  ? ?Lab Results  ?Component Value Date  ? HDL 40 (L) 05/18/2021  ? ?Lab Results  ?Component Value Date  ? Pace 170 (H) 05/18/2021  ? ?Lab Results  ?Component Value Date  ? TRIG 284 (H) 05/18/2021  ? ?Lab Results  ?Component Value Date  ? CHOLHDL 6.5 (H) 05/18/2021  ? ?Lab Results  ?Component Value Date  ? HGBA1C 5.6 06/16/2020  ? ? ?  ?Assessment & Plan:  ? ?Problem List Items Addressed This Visit   ? ?  ? Other  ? Menopausal symptoms - Primary  ?  We discussed options including hormonal and nonhormonal treatment.  We discussed potential side effects of hormonal replacement including increased risk of breast cancer cardiovascular disease.  She denies any first-degree relative with breast cancer.  She says her dad was diagnosed with coronary artery disease in his 16s.  She denies a personal history of breast issues or heart disease.  Recommend starting with a nonhormonal option we will start with Effexor which I think could do well to hit to hot flashes and irritability follow-up in 6 to 8 weeks to make sure that she is tolerating it well.  We will also recheck her blood pressure at that time. ? ?F/U 6-8 weeks.   ?  ?  ? Relevant Medications  ? venlafaxine XR (EFFEXOR XR) 37.5 MG 24 hr capsule  ? ?Other Visit Diagnoses   ? ? Lower abdominal pain      ? Relevant Orders  ? DG Abd 1 View  ? US Pelvic Complete With Transvaginal  ? Hot Martinez, menopausal      ? ?  ? ?Lower  abdominal pain-unclear etiology at this point.  She has been a little bit more constipated which I do think it could be contributing.  She is now postmenopausal and amenorrheic.  We will move forward with KUB to

## 2021-09-15 NOTE — Assessment & Plan Note (Signed)
We discussed options including hormonal and nonhormonal treatment.  We discussed potential side effects of hormonal replacement including increased risk of breast cancer cardiovascular disease.  She denies any first-degree relative with breast cancer.  She says her dad was diagnosed with coronary artery disease in his 55s.  She denies a personal history of breast issues or heart disease.  Recommend starting with a nonhormonal option we will start with Effexor which I think could do well to hit to hot flashes and irritability follow-up in 6 to 8 weeks to make sure that she is tolerating it well.  We will also recheck her blood pressure at that time. ? ?F/U 6-8 weeks.   ?

## 2021-09-15 NOTE — Progress Notes (Signed)
Hi Kristina Martinez, your urine test is normal.

## 2021-09-17 ENCOUNTER — Other Ambulatory Visit: Payer: 59

## 2021-09-18 ENCOUNTER — Other Ambulatory Visit: Payer: Self-pay

## 2021-09-18 ENCOUNTER — Ambulatory Visit (INDEPENDENT_AMBULATORY_CARE_PROVIDER_SITE_OTHER): Payer: 59

## 2021-09-18 DIAGNOSIS — R1030 Lower abdominal pain, unspecified: Secondary | ICD-10-CM | POA: Diagnosis not present

## 2021-09-18 NOTE — Progress Notes (Signed)
[place], the throat overall looks good.  They did see a lot of gas, trapped in your bowel.  But otherwise no other abnormalities.  Neck step is to go ahead and get the pelvic ultrasound.  Hopefully they have got she is scheduled for that in the next week.

## 2021-09-18 NOTE — Progress Notes (Signed)
Hi Kristina Martinez,  ?The ultrasound showed that the lining that typically you said when you have your.  It is a little bit thicker than would be expected in someone who is no longer having there.  They are recommending a sampling.  This is typically done in the office.  Just like a Pap smear except a small catheter is inserted into the opening of the uterus and a little sample is taken.  Cells that are sampled are sent to the lab to be viewed under the microscope to make sure that they are normal.  I do not want you to worry too much about it but I do think we need to work it up further.  I do not personally do the sampling we usually send you to a GYN.  Do you have someone that you would prefer to see and we can go ahead and place a referral for you.

## 2021-09-21 ENCOUNTER — Other Ambulatory Visit: Payer: Self-pay

## 2021-09-21 DIAGNOSIS — N951 Menopausal and female climacteric states: Secondary | ICD-10-CM

## 2021-10-27 ENCOUNTER — Encounter: Payer: Self-pay | Admitting: Family Medicine

## 2021-10-27 ENCOUNTER — Ambulatory Visit (INDEPENDENT_AMBULATORY_CARE_PROVIDER_SITE_OTHER): Payer: 59 | Admitting: Family Medicine

## 2021-10-27 VITALS — BP 162/90 | HR 101 | Resp 16 | Ht 66.0 in | Wt 231.0 lb

## 2021-10-27 DIAGNOSIS — R590 Localized enlarged lymph nodes: Secondary | ICD-10-CM | POA: Diagnosis not present

## 2021-10-27 DIAGNOSIS — F988 Other specified behavioral and emotional disorders with onset usually occurring in childhood and adolescence: Secondary | ICD-10-CM | POA: Diagnosis not present

## 2021-10-27 DIAGNOSIS — R1030 Lower abdominal pain, unspecified: Secondary | ICD-10-CM | POA: Diagnosis not present

## 2021-10-27 DIAGNOSIS — R918 Other nonspecific abnormal finding of lung field: Secondary | ICD-10-CM

## 2021-10-27 DIAGNOSIS — R911 Solitary pulmonary nodule: Secondary | ICD-10-CM

## 2021-10-27 DIAGNOSIS — R030 Elevated blood-pressure reading, without diagnosis of hypertension: Secondary | ICD-10-CM

## 2021-10-27 DIAGNOSIS — N951 Menopausal and female climacteric states: Secondary | ICD-10-CM

## 2021-10-27 DIAGNOSIS — F419 Anxiety disorder, unspecified: Secondary | ICD-10-CM

## 2021-10-27 MED ORDER — ALPRAZOLAM 0.5 MG PO TABS: ORAL_TABLET | ORAL | 1 refills | Status: DC

## 2021-10-27 MED ORDER — METHYLPHENIDATE HCL 10 MG PO TABS: 10.0000 mg | ORAL_TABLET | Freq: Two times a day (BID) | ORAL | 0 refills | Status: DC

## 2021-10-27 NOTE — Assessment & Plan Note (Signed)
Seen on CT back in 2017 at [place].  Patient would like to do the optional followed up CT. ?

## 2021-10-27 NOTE — Assessment & Plan Note (Signed)
She does feel like the venlafaxine has been helpful for the irritability and mood but not as impressive for controlling the hot flashes we discussed options including increasing the Effexor to 75 mg.  Though she is already getting a little dry mouth.  We also discussed maybe adding a low-dose of gabapentin at bedtime to help more with the nighttime hot flashes.  Right now she wants to continue with her current regimen.  But she will let me know if at any point she wants to make any changes. ?

## 2021-10-27 NOTE — Assessment & Plan Note (Signed)
She does need a refill on her alprazolam.  Continue to use sparingly. ?

## 2021-10-27 NOTE — Progress Notes (Signed)
? ?Established Patient Office Visit ? ?Subjective   ?Patient ID: Kristina Martinez, female    DOB: 11-Oct-1970  Age: 51 y.o. MRN: [id number] ? ?Chief Complaint  ?Patient presents with  ? Follow-up  ?  Effexor. Patient stated she is feeling much better on the Effexor   ? Groin Pain  ?  Patient stated she is still having groin pain. Patient stated she was seen at OB/GYN with no improvement of the groin pain.   ? ? ?HPI ? ?Menopausal symptoms-she says the Effexor has been super helpful with the mood and rage.  But she says it has not helped much with the hot flashes.  She has noticed a little dry mouth. ? ?She also has noticed a couple little little lumps at the top of her neck just under her jaw area she says they have been there for about a year.  She says they increase in size and go down a little bit but they never completely go away.  No pain or tenderness.  No difficulty swallowing.  No sore throat. ? ?She unfortunately is still having the lower abdominal/groin pain.  She did see the GYN after having had an ultrasound which showed some endometrial thickness.  They recommended repeating it again in 6 months since she is not fully postmenopausal.  And then at that point in time they may consider biopsy if needed.  The GYN felt like her pain could be related to her chronic back issues.  She has had chronic pain on and off for quite some time and has even contemplated surgical treatment. ? ?He is excited that she has lost some weight since coming off of her birth control and maybe even having the Effexor to help regulate her mood has been helpful.  She also wanted to discuss some of the newer weight loss medication on the market particularly Wegovy. ? ?She also had a CT abdomen pelvis performed at [facility] in 2017.  At that time they did note a subpleural nodule in the right middle lobe measuring about 5 mm.  They did recommend optional CT follow-up at 12 months.  She would like to have that done. ? ? ? ?ROS ? ?   ?Objective:  ?  ? ?BP (!) 162/90 (BP Location: Right Arm)   Pulse (!) 101   Resp 16   Ht '5\' 6"'$  (1.676 m)   Wt 231 lb (104.8 kg)   LMP 03/09/2020 (Exact Date)   SpO2 95%   BMI 37.28 kg/m?  ? ? ?Physical Exam ?Vitals reviewed.  ?Constitutional:   ?   Appearance: She is well-developed.  ?HENT:  ?   Head: Normocephalic and atraumatic.  ?Eyes:  ?   Conjunctiva/sclera: Conjunctivae normal.  ?Neck:  ?   Comments: Palpable cervical lymph node in the right upper anterior area below the jawline.  Thyroid is borderline enlarged but I do not feel any discrete nodules.  Some small lymph nodes on the left side. ?Cardiovascular:  ?   Rate and Rhythm: Normal rate.  ?Pulmonary:  ?   Effort: Pulmonary effort is normal.  ?Lymphadenopathy:  ?   Cervical: Cervical adenopathy present.  ?Skin: ?   General: Skin is dry.  ?   Coloration: Skin is not pale.  ?Neurological:  ?   Mental Status: She is alert and oriented to person, place, and time.  ?Psychiatric:     ?   Behavior: Behavior normal.  ? ? ? ?No results found for any visits on  10/27/21. ? ? ? ?The 10-year ASCVD risk score (Arnett DK, et al., 2019) is: 4.9% ? ?  ?Assessment & Plan:  ? ?Problem List Items Addressed This Visit   ? ?  ? Respiratory  ? Pulmonary nodule, right  ?  Seen on CT back in 2017 at [place].  Patient would like to do the optional followed up CT. ? ?  ?  ?  ? Other  ? Menopausal symptoms  ?  She does feel like the venlafaxine has been helpful for the irritability and mood but not as impressive for controlling the hot flashes we discussed options including increasing the Effexor to 75 mg.  Though she is already getting a little dry mouth.  We also discussed maybe adding a low-dose of gabapentin at bedtime to help more with the nighttime hot flashes.  Right now she wants to continue with her current regimen.  But she will let me know if at any point she wants to make any changes. ? ?  ?  ? Attention deficit disorder  ?  Able on current regimen.  Refills  sent to pharmacy. ? ?  ?  ? Relevant Medications  ? methylphenidate (RITALIN) 10 MG tablet  ? Anxiety  ?  She does need a refill on her alprazolam.  Continue to use sparingly. ? ?  ?  ? Relevant Medications  ? ALPRAZolam (XANAX) 0.5 MG tablet  ? ?Other Visit Diagnoses   ? ? Lower abdominal pain    -  Primary  ? Relevant Orders  ? CT Abdomen Pelvis W Contrast  ? Cervical lymphadenopathy      ? Relevant Orders  ? US Soft Tissue Head/Neck (NON-THYROID)  ? Elevated BP without diagnosis of hypertension      ? Abnormal findings on diagnostic imaging of lung      ? Relevant Orders  ? CT Chest Wo Contrast  ? ?  ? ? ?Elevated blood pressure without diagnosis of hypertension-repeat blood pressure today was unfortunately still elevated some but have her come back in about 2 weeks to recheck. ? ?Lower abdominal pain-we will move forward with CT of the abdomen and pelvis.  If no concerning findings to explain her pain then recommend that she follow back up with her orthopedist for her back as this could certainly be a potential cause.  It did start to occur after she had been doing some moving and lifting. ? ? ?No follow-ups on file.  ? ? ?Beatrice Lecher, MD ? ?

## 2021-10-27 NOTE — Assessment & Plan Note (Signed)
Able on current regimen.  Refills sent to pharmacy. ?

## 2021-11-04 ENCOUNTER — Other Ambulatory Visit: Payer: 59

## 2021-11-05 ENCOUNTER — Other Ambulatory Visit: Payer: 59

## 2021-11-05 ENCOUNTER — Ambulatory Visit (INDEPENDENT_AMBULATORY_CARE_PROVIDER_SITE_OTHER): Payer: 59

## 2021-11-05 ENCOUNTER — Other Ambulatory Visit: Payer: Self-pay | Admitting: Family Medicine

## 2021-11-05 ENCOUNTER — Encounter: Payer: Self-pay | Admitting: Family Medicine

## 2021-11-05 DIAGNOSIS — R918 Other nonspecific abnormal finding of lung field: Secondary | ICD-10-CM

## 2021-11-05 DIAGNOSIS — R112 Nausea with vomiting, unspecified: Secondary | ICD-10-CM

## 2021-11-05 DIAGNOSIS — R590 Localized enlarged lymph nodes: Secondary | ICD-10-CM

## 2021-11-05 DIAGNOSIS — R1030 Lower abdominal pain, unspecified: Secondary | ICD-10-CM

## 2021-11-05 DIAGNOSIS — R1032 Left lower quadrant pain: Secondary | ICD-10-CM | POA: Diagnosis not present

## 2021-11-05 MED ORDER — IOHEXOL 300 MG/ML  SOLN
100.0000 mL | Freq: Once | INTRAMUSCULAR | Status: AC | PRN
  Administered 2021-11-05: 100 mL via INTRAVENOUS

## 2021-11-05 NOTE — Progress Notes (Signed)
Hi Kristina Martinez, they did confirm that there are enlarged lymph nodes in both sides of your neck.  Did not mention any unusual features but because they are greater than a centimeter they recommended that we do a neck CT with contrast to work it up further.  If you are okay with moving forward with the scan then please let me know and I will get you scheduled.

## 2021-11-09 NOTE — Progress Notes (Signed)
Hi Kristina Martinez, chest CT shows some calcification on the coronary arteries and aorta so a little bit of plaque deposition.  There are no abnormal looking lymph nodes.  Thyroid looks okay.  You do have a lung nodule on the right side measuring about 3 to 4 mm.  They saw the same spot back in 2019 when they were looking for kidney stone and it was measuring about 2 mm at that point.  So that is a very small amount of change over the last 4 years.  But because it did increase in size they did recommend a repeat CT in 1 year just to make sure it stable.  Again noted the hiatal hernia similar to on the other film.  As well as that plaque deposition.  You would benefit from being on a statin which is a cholesterol-lowering medication to help reduce plaque deposition onto the blood vessels.

## 2021-11-09 NOTE — Telephone Encounter (Signed)
?  Orders Placed This Encounter  ?Procedures  ? CT Soft Tissue Neck W Contrast  ?  Follow-up from abnormal ultrasound results.  ?  Standing Status:   Future  ?  Standing Expiration Date:   11/10/2022  ?  Order Specific Question:   If indicated for the ordered procedure, I authorize the administration of contrast media per Radiology protocol  ?  Answer:   Yes  ?  Order Specific Question:   Is patient pregnant?  ?  Answer:   No  ?  Order Specific Question:   Preferred imaging location?  ?  Answer:   [facility]  ? ? ?

## 2021-11-09 NOTE — Progress Notes (Signed)
Hi Kristina Martinez, no acute diverticulitis which is great you do we do not have the actual pockets but they are not infected.  They did see increased fine content in the liver consistent with hepatic steatosis.  The goal is to continue to work on healthy food choices and regular exercise.  That reduces the fat component of the liver which also reduces the potential for developing cirrhosis over time.  You do have a hiatal hernia.  This can contribute to frequent reflux type symptoms.  And you do have a hernia at the bellybutton as well.  Please let me know if you are still feeling poorly or if you are starting to feel better.

## 2021-11-11 ENCOUNTER — Ambulatory Visit (INDEPENDENT_AMBULATORY_CARE_PROVIDER_SITE_OTHER): Payer: 59

## 2021-11-11 ENCOUNTER — Ambulatory Visit (INDEPENDENT_AMBULATORY_CARE_PROVIDER_SITE_OTHER): Payer: 59 | Admitting: Physician Assistant

## 2021-11-11 VITALS — BP 142/89 | HR 85 | Temp 98.7°F | Ht 66.0 in | Wt 235.1 lb

## 2021-11-11 DIAGNOSIS — R030 Elevated blood-pressure reading, without diagnosis of hypertension: Secondary | ICD-10-CM | POA: Diagnosis not present

## 2021-11-11 DIAGNOSIS — R590 Localized enlarged lymph nodes: Secondary | ICD-10-CM | POA: Diagnosis not present

## 2021-11-11 MED ORDER — IOHEXOL 300 MG/ML  SOLN
100.0000 mL | Freq: Once | INTRAMUSCULAR | Status: AC | PRN
  Administered 2021-11-11: 75 mL via INTRAVENOUS

## 2021-11-11 NOTE — Progress Notes (Signed)
Patient is here for blood pressure check. Denies trouble sleeping, palpitations, dizziness, lightheadedness, blurry vision, chest pain, shortness of breath, headaches and/or medication problems.   Patient's blood pressure was out of goal range. Patient sat for 15 minutes.  Second blood pressure recheck was not at goal. Covering provider notified of current blood pressure reading. Per provider, recommended patient start HCTZ 12.5 mg, then blood pressure check in 2 weeks or return for another Nurse visit. Patient declined to start BP med. She will check her bp at home and record readings.  Patient informed to schedule next appointment with Nurse in 2 weeks. Patient informed to bring in personal bp machine with readings.

## 2021-11-12 NOTE — Progress Notes (Signed)
BP is better but still elevated. Suggested a HCTZ 12.'5mg'$  start. Pt wanted to keep BP log and recheck with PCP in 2 weeks.

## 2021-11-13 NOTE — Progress Notes (Signed)
Hi Kristina Martinez, your note lymph nodes are definitely on the larger side right around 1 cm.  But they do seem to be symmetric meaning that they are similar on both sides of your neck which is actually very reassuring versus just 1 or 2 audible lymph nodes.  They did not see any inflammation around the lymph nodes which is also reassuring.  So they feel like this may just be normal for you.  Again no worrisome features but they did recommend that we follow-up by clinical exam which basically just means me actually feeling the lymph nodes in your neck.  Lets plan to do that maybe in 3 to 4 months just to make sure that they are changing or getting larger.  And they did recommend a repeat neck CT if any areas are getting larger.

## 2021-11-25 ENCOUNTER — Ambulatory Visit: Payer: 59

## 2021-11-27 ENCOUNTER — Other Ambulatory Visit: Payer: Self-pay | Admitting: Family Medicine

## 2021-11-27 DIAGNOSIS — N951 Menopausal and female climacteric states: Secondary | ICD-10-CM

## 2021-12-02 ENCOUNTER — Ambulatory Visit (INDEPENDENT_AMBULATORY_CARE_PROVIDER_SITE_OTHER): Payer: 59 | Admitting: Family Medicine

## 2021-12-02 DIAGNOSIS — R030 Elevated blood-pressure reading, without diagnosis of hypertension: Secondary | ICD-10-CM

## 2021-12-02 NOTE — Progress Notes (Signed)
Pt presents to clinic today for BP check. Denies any cp/sob/palpitaions/headaches/dizziness or swelling.    

## 2021-12-02 NOTE — Progress Notes (Signed)
Agree with documentation as above.   Nickolette Espinola, MD  

## 2021-12-30 ENCOUNTER — Encounter: Payer: Self-pay | Admitting: Family Medicine

## 2021-12-31 MED ORDER — WEGOVY 0.25 MG/0.5ML ~~LOC~~ SOAJ: 0.2500 mg | SUBCUTANEOUS | 0 refills | Status: DC

## 2021-12-31 NOTE — Telephone Encounter (Signed)
Starter dose sent to [facility].  Will need to follow-up in 1 month.  There is a backorder on the medication currently so if her pharmacy does not have it available she will need to call around and find who may have it and we can send a new prescription.

## 2022-02-02 ENCOUNTER — Encounter: Payer: Self-pay | Admitting: Medical-Surgical

## 2022-02-02 ENCOUNTER — Telehealth: Payer: 59 | Admitting: Physician Assistant

## 2022-02-02 ENCOUNTER — Ambulatory Visit (INDEPENDENT_AMBULATORY_CARE_PROVIDER_SITE_OTHER): Payer: 59 | Admitting: Medical-Surgical

## 2022-02-02 VITALS — BP 131/79 | HR 88 | Resp 20 | Ht 66.0 in | Wt 243.1 lb

## 2022-02-02 DIAGNOSIS — R220 Localized swelling, mass and lump, head: Secondary | ICD-10-CM | POA: Diagnosis not present

## 2022-02-02 MED ORDER — AMOXICILLIN-POT CLAVULANATE 875-125 MG PO TABS: 1.0000 | ORAL_TABLET | Freq: Two times a day (BID) | ORAL | 0 refills | Status: DC

## 2022-02-02 NOTE — Progress Notes (Signed)
Established Patient Office Visit  Subjective   Patient ID: GWYNN CHALKER, female   DOB: 1971-06-25 Age: 51 y.o. MRN: [id number]   Chief Complaint  Patient presents with   Facial Swelling    HPI Pleasant 51 year old female presenting today for evaluation of right facial swelling.  Notes that she woke up this morning and was putting her cup in the sink when she had a strange feeling along the right side of her face.  When she felt along her jaw and in front of her ear, it was tender to touch and felt very swollen.  She did not have any other symptoms to accompany it including fever, chills, foul taste in her mouth, popping/clicking of the jaw, sore throat, or sinus congestion.  She notes that she did have a bit of dry mouth last night but that has been the only other recent concern.  Does feel like the swelling has improved somewhat since she got up and was moving around however it has not fully resolved.   Objective:    Vitals:   02/02/22 1053  BP: 131/79  Pulse: 88  Resp: 20  Height: '5\' 6"'$  (1.676 m)  Weight: 243 lb 1.6 oz (110.3 kg)  SpO2: 98%  BMI (Calculated): 39.26   Physical Exam Vitals and nursing note reviewed.  Constitutional:      General: She is not in acute distress.    Appearance: Normal appearance. She is not ill-appearing.  HENT:     Head: Normocephalic and atraumatic.     Jaw: Swelling (Right) present.      Right Ear: Tympanic membrane, ear canal and external ear normal. There is no impacted cerumen.     Left Ear: Tympanic membrane, ear canal and external ear normal. There is no impacted cerumen.     Nose: Nose normal.     Mouth/Throat:     Mouth: Mucous membranes are moist.     Pharynx: No oropharyngeal exudate or posterior oropharyngeal erythema.  Eyes:     General: No scleral icterus.       Right eye: No discharge.        Left eye: No discharge.     Extraocular Movements: Extraocular movements intact.     Conjunctiva/sclera: Conjunctivae normal.      Pupils: Pupils are equal, round, and reactive to light.  Cardiovascular:     Rate and Rhythm: Normal rate and regular rhythm.     Pulses: Normal pulses.     Heart sounds: Normal heart sounds.  Pulmonary:     Effort: Pulmonary effort is normal. No respiratory distress.     Breath sounds: Normal breath sounds. No wheezing, rhonchi or rales.  Lymphadenopathy:     Cervical: Cervical adenopathy (Mild right submandibular) present.  Skin:    General: Skin is warm and dry.  Neurological:     Mental Status: She is alert and oriented to person, place, and time.  Psychiatric:        Mood and Affect: Mood normal.        Behavior: Behavior normal.        Thought Content: Thought content normal.        Judgment: Judgment normal.   No results found for this or any previous visit (from the past 24 hour(s)).     The 10-year ASCVD risk score (Arnett DK, et al., 2019) is: 3.3%   Values used to calculate the score:     Age: 10 years     Sex: Female  Is Non-Hispanic African American: No     Diabetic: No     Tobacco smoker: No     Systolic Blood Pressure: 458 mmHg     Is BP treated: No     HDL Cholesterol: 40 mg/dL     Total Cholesterol: 261 mg/dL   Assessment & Plan:   1. Right facial swelling Unclear etiology.  Consider parotid salivary stone versus viral or bacterial infection.  No other symptoms noted however dry mouth night before is concerning for possible stone blocking the salivary duct.  Advised to start warm compresses 10 to 15 minutes each 4 times daily.  Advised oral anti-inflammatories as well as massage of the swollen area.  If no improvement with conservative measures over the next 48 hours, start Augmentin twice daily.  If symptoms fail to improve with antibiotics and other conservative measures, return for further evaluation and potential imaging.  Return if symptoms worsen or fail to improve.  ___________________________________________ Clearnce Sorrel, DNP, APRN,  FNP-BC [facility]

## 2022-04-09 ENCOUNTER — Encounter: Payer: Self-pay | Admitting: Family Medicine

## 2022-04-13 ENCOUNTER — Ambulatory Visit (INDEPENDENT_AMBULATORY_CARE_PROVIDER_SITE_OTHER): Payer: 59 | Admitting: Family Medicine

## 2022-04-13 ENCOUNTER — Encounter: Payer: Self-pay | Admitting: Family Medicine

## 2022-04-13 VITALS — BP 150/93 | HR 95 | Ht 66.0 in | Wt 245.0 lb

## 2022-04-13 DIAGNOSIS — K429 Umbilical hernia without obstruction or gangrene: Secondary | ICD-10-CM

## 2022-04-13 NOTE — Assessment & Plan Note (Signed)
-   hernia seen on exam and able to be reduced  - have sent referral to general surgery as pt is interested in surgery

## 2022-04-13 NOTE — Progress Notes (Signed)
   Acute Office Visit  Subjective:     Patient ID: Kristina Martinez, female    DOB: [birth year], 51 y.o.   MRN: [id number]  Chief Complaint  Patient presents with   Abdominal Pain    HPI Patient is in today for concerns of a hernia. She localizes hernia to navel area. Has been able to push her hernia back in.   Review of Systems  Constitutional:  Negative for chills and fever.  Respiratory:  Negative for cough and shortness of breath.   Cardiovascular:  Negative for chest pain.  Neurological:  Negative for headaches.        Objective:    BP (!) 150/93   Pulse 95   Ht '5\' 6"'$  (1.676 m)   Wt 245 lb (111.1 kg)   LMP 03/09/2020 (Exact Date)   SpO2 98%   BMI 39.54 kg/m    Physical Exam Vitals and nursing note reviewed.  Constitutional:      General: She is not in acute distress.    Appearance: Normal appearance.  HENT:     Head: Normocephalic and atraumatic.     Right Ear: External ear normal.     Left Ear: External ear normal.     Nose: Nose normal.  Eyes:     Conjunctiva/sclera: Conjunctivae normal.  Cardiovascular:     Rate and Rhythm: Normal rate and regular rhythm.  Pulmonary:     Effort: Pulmonary effort is normal.     Breath sounds: Normal breath sounds.  Abdominal:     Hernia: A hernia is present. Hernia is present in the umbilical area (able to reduce).  Neurological:     General: No focal deficit present.     Mental Status: She is alert and oriented to person, place, and time.  Psychiatric:        Mood and Affect: Mood normal.        Behavior: Behavior normal.        Thought Content: Thought content normal.        Judgment: Judgment normal.     No results found for any visits on 04/13/22.      Assessment & Plan:   Problem List Items Addressed This Visit       Other   Umbilical hernia without obstruction and without gangrene - Primary    - hernia seen on exam and able to be reduced  - have sent referral to general surgery as pt is  interested in surgery      Relevant Orders   Ambulatory referral to General Surgery    No orders of the defined types were placed in this encounter.   Return with PCP.  Owens Loffler, DO

## 2022-06-16 ENCOUNTER — Encounter: Payer: Self-pay | Admitting: Family Medicine

## 2022-06-16 MED ORDER — WEGOVY 0.25 MG/0.5ML ~~LOC~~ SOAJ: 0.2500 mg | SUBCUTANEOUS | 0 refills | Status: DC

## 2022-07-06 MED ORDER — WEGOVY 0.25 MG/0.5ML ~~LOC~~ SOAJ: 0.2500 mg | SUBCUTANEOUS | 0 refills | Status: DC

## 2022-07-06 NOTE — Addendum Note (Signed)
Addended by: Narda Rutherford on: 07/06/2022 09:09 AM   Modules accepted: Orders

## 2022-08-03 ENCOUNTER — Other Ambulatory Visit: Payer: Self-pay | Admitting: Family Medicine

## 2022-08-03 DIAGNOSIS — Z1231 Encounter for screening mammogram for malignant neoplasm of breast: Secondary | ICD-10-CM

## 2022-08-04 ENCOUNTER — Ambulatory Visit (INDEPENDENT_AMBULATORY_CARE_PROVIDER_SITE_OTHER): Payer: 59

## 2022-08-04 DIAGNOSIS — Z1231 Encounter for screening mammogram for malignant neoplasm of breast: Secondary | ICD-10-CM

## 2022-08-06 NOTE — Progress Notes (Signed)
Please call patient. Normal mammogram.  Repeat in 1 year.  

## 2022-08-17 ENCOUNTER — Encounter: Payer: Self-pay | Admitting: Family Medicine

## 2022-08-17 ENCOUNTER — Ambulatory Visit (INDEPENDENT_AMBULATORY_CARE_PROVIDER_SITE_OTHER): Payer: 59 | Admitting: Family Medicine

## 2022-08-17 VITALS — BP 132/84 | HR 82 | Ht 66.0 in | Wt 247.0 lb

## 2022-08-17 DIAGNOSIS — Z0000 Encounter for general adult medical examination without abnormal findings: Secondary | ICD-10-CM

## 2022-08-17 DIAGNOSIS — R252 Cramp and spasm: Secondary | ICD-10-CM

## 2022-08-17 DIAGNOSIS — F419 Anxiety disorder, unspecified: Secondary | ICD-10-CM

## 2022-08-17 DIAGNOSIS — R7301 Impaired fasting glucose: Secondary | ICD-10-CM | POA: Diagnosis not present

## 2022-08-17 DIAGNOSIS — E785 Hyperlipidemia, unspecified: Secondary | ICD-10-CM

## 2022-08-17 DIAGNOSIS — D649 Anemia, unspecified: Secondary | ICD-10-CM

## 2022-08-17 DIAGNOSIS — Z7689 Persons encountering health services in other specified circumstances: Secondary | ICD-10-CM

## 2022-08-17 DIAGNOSIS — F988 Other specified behavioral and emotional disorders with onset usually occurring in childhood and adolescence: Secondary | ICD-10-CM

## 2022-08-17 DIAGNOSIS — Z1329 Encounter for screening for other suspected endocrine disorder: Secondary | ICD-10-CM

## 2022-08-17 MED ORDER — ONDANSETRON 8 MG PO TBDP: 8.0000 mg | ORAL_TABLET | Freq: Three times a day (TID) | ORAL | 0 refills | Status: DC | PRN

## 2022-08-17 MED ORDER — METHYLPHENIDATE HCL 10 MG PO TABS: 10.0000 mg | ORAL_TABLET | Freq: Two times a day (BID) | ORAL | 0 refills | Status: DC

## 2022-08-17 MED ORDER — ALPRAZOLAM 0.5 MG PO TABS: ORAL_TABLET | ORAL | 1 refills | Status: DC

## 2022-08-17 NOTE — Assessment & Plan Note (Signed)
She does need a refill on her alprazolam which she just uses sparingly she says she has not used any in a couple of months.

## 2022-08-17 NOTE — Assessment & Plan Note (Addendum)
Visit #: 1 Starting [id number] lbs   Current weight: 247 lbs  Previous weight: 245 lbs  Change in weight: Goal weight:  Dietary goals: continue to work on increasing vegetable intake and portion control Exercise goals: Stay active  Medication: Wegovy 0.57m.  Plan to increase to 0.5 in one mo.  Will work on PA Follow-up and referrals:

## 2022-08-17 NOTE — Progress Notes (Signed)
Established Patient Office Visit  Subjective   Patient ID: Kristina Martinez, female    DOB: 10/05/1970  Age: 52 y.o. MRN: [id number]  Chief Complaint  Patient presents with   Follow-up         HPI   Impaired fasting glucose-no increased thirst or urination. No symptoms consistent with hypoglycemia.  ADD - Reports symptoms are well controlled on current regime. Denies any problems with insomnia, chest pain, palpitations, or SOB.    Also gets a lot of cramping in her feet, lower legs and even thighs at times she gets a lot of pain in her ankles and her knees.  She is on her feet a lot during the day she tries to wear good supportive shoe wear.  Has been taking some magnesium and that helped slightly.    ROS    Objective:     BP 132/84   Pulse 82   Ht 5' 6"$  (1.676 m)   Wt 247 lb (112 kg)   LMP 03/09/2020 (Exact Date)   SpO2 100%   BMI 39.87 kg/m    Physical Exam Vitals and nursing note reviewed.  Constitutional:      Appearance: She is well-developed.  HENT:     Head: Normocephalic and atraumatic.  Cardiovascular:     Rate and Rhythm: Normal rate and regular rhythm.     Heart sounds: Normal heart sounds.  Pulmonary:     Effort: Pulmonary effort is normal.     Breath sounds: Normal breath sounds.  Skin:    General: Skin is warm and dry.  Neurological:     Mental Status: She is alert and oriented to person, place, and time.  Psychiatric:        Behavior: Behavior normal.     No results found for any visits on 08/17/22.    The 10-year ASCVD risk score (Arnett DK, et al., 2019) is: 3.3%    Assessment & Plan:   Problem List Items Addressed This Visit       Endocrine   IFG (impaired fasting glucose)   Relevant Orders   COMPLETE METABOLIC PANEL WITH GFR     Other   HLD (hyperlipidemia)   Relevant Orders   Lipid Panel w/reflex Direct LDL   Encounter for weight management    Visit #: 1 Starting [id number] lbs   Current weight: 247 lbs   Previous weight: 245 lbs  Change in weight: Goal weight:  Dietary goals: continue to work on increasing vegetable intake and portion control Exercise goals: Stay active  Medication: Wegovy 0.87m.  Plan to increase to 0.5 in one mo.  Will work on PA Follow-up and referrals:       Chronic anemia   Attention deficit disorder    Doing well with current regimen.  Refill sent to pharmacy.  Follow-up in 6 months.      Relevant Medications   methylphenidate (RITALIN) 10 MG tablet   Anxiety    She does need a refill on her alprazolam which she just uses sparingly she says she has not used any in a couple of months.      Relevant Medications   ALPRAZolam (XANAX) 0.5 MG tablet   Other Visit Diagnoses     Wellness examination    -  Primary   Relevant Orders   TSH   Lipid Panel w/reflex Direct LDL   COMPLETE METABOLIC PANEL WITH GFR   Thyroid disorder screen       Relevant Orders  TSH   Muscle cramping          Muscle cramping-we did discuss just continuing to wear good shoe wear I also would like for her to try metatarsal supports to see if it is helpful in her shoes.  Also really pain attention to heel height and whether or not she has more or less leg cramps that night.  Okay to continue with the magnesium.  Will check potassium and for thyroid disorder on labs just to rule out other causes.  It could be also coming from spinal stenosis and or claudication.  MRI of lumbar spine in February 2022 showed some degenerative disc and facet arthritis but no neural compression.  Return in about 6 weeks (around 09/28/2022) for Weight management .    Kristina Lecher, MD

## 2022-08-17 NOTE — Assessment & Plan Note (Signed)
Doing well with current regimen.  Refill sent to pharmacy.  Follow-up in 6 months.

## 2022-08-18 ENCOUNTER — Telehealth: Payer: Self-pay

## 2022-08-18 NOTE — Telephone Encounter (Addendum)
Initiated Prior authorization [id number] 0.'25MG'$ /0.5ML auto-injectors Via: Covermymeds Case/Key:[id number] Status: denied as of 08/18/22 Reason:plan exclusion  Notified Pt via: Mychart

## 2022-08-30 ENCOUNTER — Encounter: Payer: Self-pay | Admitting: Family Medicine

## 2022-08-30 MED ORDER — WEGOVY 0.5 MG/0.5ML ~~LOC~~ SOAJ: 0.5000 mg | SUBCUTANEOUS | 0 refills | Status: DC

## 2022-08-30 NOTE — Telephone Encounter (Signed)
Meds ordered this encounter  Medications   Semaglutide-Weight Management (WEGOVY) 0.5 MG/0.5ML SOAJ    Sig: Inject 0.5 mg into the skin every 7 (seven) days.    Dispense:  2 mL    Refill:  0

## 2022-09-27 ENCOUNTER — Ambulatory Visit (INDEPENDENT_AMBULATORY_CARE_PROVIDER_SITE_OTHER): Payer: 59 | Admitting: Family Medicine

## 2022-09-27 ENCOUNTER — Encounter: Payer: Self-pay | Admitting: Family Medicine

## 2022-09-27 VITALS — BP 140/92 | HR 99 | Temp 98.3°F | Resp 20 | Ht 66.0 in | Wt 241.0 lb

## 2022-09-27 DIAGNOSIS — Z7689 Persons encountering health services in other specified circumstances: Secondary | ICD-10-CM

## 2022-09-27 DIAGNOSIS — R7301 Impaired fasting glucose: Secondary | ICD-10-CM

## 2022-09-27 MED ORDER — WEGOVY 1 MG/0.5ML ~~LOC~~ SOAJ: 1.0000 mg | SUBCUTANEOUS | 1 refills | Status: DC

## 2022-09-27 NOTE — Assessment & Plan Note (Addendum)
Visit #: 2 Starting [id number] lbs    Current weight: 247 lbs  Previous weight: 241 lbs  Change in weight: down 6 lbs  Goal weight: 175 lbs  Dietary goals: continue to work on increasing vegetable intake and portion control Exercise goals: Stay active  Medication: On Wegovy 0.5mg .  PA denied Follow-up and referrals: 8 weeks

## 2022-09-27 NOTE — Assessment & Plan Note (Signed)
Weight is improving.  Did encourage her to cut back on sweets since one of the things that she does struggle with.  Will check A1c with next set of labs.

## 2022-09-27 NOTE — Progress Notes (Signed)
   Established Patient Office Visit  Subjective   Patient ID: Kristina Martinez, female    DOB: [birth year]  Age: 52 y.o. MRN: [id number]  Chief Complaint  Patient presents with   Weight Management Screening    Patient is here for a weight management appointment she states that at this time she doesn't have any concerns at this time    HPI  Follow-up weight management-is doing doing really well with the 0.5 mg.  She has about 2 more weeks of the prescription.  She has had a little bump in her nausea lately but feels like it is more motion triggered especially if she rotates her head to the right or to the left especially if she is in that position for a while she will start to feel little bit nauseated.  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   History of prediabetes-she admits she has been eating a little bit more Easter candy recently.    ROS    Objective:     BP (!) 140/92   Pulse 99   Temp 98.3 F (36.8 C) (Oral)   Resp 20   Ht 5\' 6"  (1.676 m)   Wt 241 lb (109.3 kg)   LMP 03/09/2020 (Exact Date)   SpO2 100%   BMI 38.90 kg/m    Physical Exam Vitals and nursing note reviewed.  Constitutional:      Appearance: She is well-developed.  HENT:     Head: Normocephalic and atraumatic.  Cardiovascular:     Rate and Rhythm: Normal rate and regular rhythm.     Heart sounds: Normal heart sounds.  Pulmonary:     Effort: Pulmonary effort is normal.     Breath sounds: Normal breath sounds.  Skin:    General: Skin is warm and dry.  Neurological:     Mental Status: She is alert and oriented to person, place, and time.  Psychiatric:        Behavior: Behavior normal.      No results found for any visits on 09/27/22.    The 10-year ASCVD risk score (Arnett DK, et al., 2019) is: 3.7%    Assessment & Plan:   Problem List Items Addressed This  Visit       Endocrine   IFG (impaired fasting glucose)    Weight is improving.  Did encourage her to cut back on sweets since one of the things that she does struggle with.  Will check A1c with next set of labs.        Other   Encounter for weight management - Primary    Visit #: 2 Starting [id number] lbs    Current weight: 247 lbs  Previous weight: 241 lbs  Change in weight: down 6 lbs  Goal weight: 175 lbs  Dietary goals: continue to work on increasing vegetable intake and portion control Exercise goals: Stay active  Medication: On Wegovy 0.5mg .  PA denied Follow-up and referrals: 8 weeks        Return in about 2 months (around 11/27/2022) for weight managment .    Beatrice Lecher, MD

## 2022-10-12 ENCOUNTER — Encounter: Payer: Self-pay | Admitting: Family Medicine

## 2022-10-15 MED ORDER — WEGOVY 1.7 MG/0.75ML ~~LOC~~ SOAJ: 1.7000 mg | SUBCUTANEOUS | 1 refills | Status: DC

## 2022-10-15 NOTE — Telephone Encounter (Signed)
Sent over next strength to [facility]  Meds ordered this encounter  Medications   Semaglutide-Weight Management (WEGOVY) 1.7 MG/0.75ML SOAJ    Sig: Inject 1.7 mg into the skin every 7 (seven) days.    Dispense:  3 mL    Refill:  1    See if any coupones that might work.

## 2022-10-26 LAB — COMPLETE METABOLIC PANEL WITH GFR
AG Ratio: 1.8 (calc) (ref 1.0–2.5)
ALT: 15 U/L (ref 6–29)
AST: 15 U/L (ref 10–35)
Albumin: 4.2 g/dL (ref 3.6–5.1)
Alkaline phosphatase (APISO): 157 U/L — ABNORMAL HIGH (ref 37–153)
BUN: 12 mg/dL (ref 7–25)
CO2: 25 mmol/L (ref 20–32)
Calcium: 9.5 mg/dL (ref 8.6–10.4)
Chloride: 102 mmol/L (ref 98–110)
Creat: 0.72 mg/dL (ref 0.50–1.03)
Globulin: 2.4 g/dL (calc) (ref 1.9–3.7)
Glucose, Bld: 104 mg/dL — ABNORMAL HIGH (ref 65–99)
Potassium: 4.5 mmol/L (ref 3.5–5.3)
Sodium: 138 mmol/L (ref 135–146)
Total Bilirubin: 0.7 mg/dL (ref 0.2–1.2)
Total Protein: 6.6 g/dL (ref 6.1–8.1)
eGFR: 99 mL/min/{1.73_m2} (ref >=60)

## 2022-10-26 LAB — LIPID PANEL W/REFLEX DIRECT LDL
Cholesterol: 247 mg/dL — ABNORMAL HIGH (ref <200)
HDL: 42 mg/dL — ABNORMAL LOW (ref >=50)
LDL Cholesterol (Calc): 167 mg/dL (calc) — ABNORMAL HIGH
Non-HDL Cholesterol (Calc): 205 mg/dL (calc) — ABNORMAL HIGH (ref <130)
Total CHOL/HDL Ratio: 5.9 (calc) — ABNORMAL HIGH (ref <5.0)
Triglycerides: 215 mg/dL — ABNORMAL HIGH (ref <150)

## 2022-10-26 LAB — TSH: TSH: 1.01 mIU/L

## 2022-10-26 NOTE — Progress Notes (Signed)
Hi Kristina Martinez, your Triglycerides look better than last year. Still high, but better. Alk phos which is a liver enzymes is borderline. Plan to recheck in 3 months. Thyroid is normal.   The 10-year ASCVD risk score (Arnett DK, et al., 2019) is: 3.6%   Values used to calculate the score:     Age: 52 years     Sex: Female     Is Non-Hispanic African American: No     Diabetic: No     Tobacco smoker: No     Systolic Blood Pressure: 142 mmHg     Is BP treated: No     HDL Cholesterol: 42 mg/dL     Total Cholesterol: 247 mg/dL

## 2022-11-29 ENCOUNTER — Encounter: Payer: Self-pay | Admitting: Family Medicine

## 2022-11-29 ENCOUNTER — Telehealth: Payer: Self-pay | Admitting: Family Medicine

## 2022-11-29 ENCOUNTER — Ambulatory Visit (INDEPENDENT_AMBULATORY_CARE_PROVIDER_SITE_OTHER): Payer: 59 | Admitting: Family Medicine

## 2022-11-29 VITALS — BP 127/78 | HR 93 | Ht 66.0 in | Wt 229.0 lb

## 2022-11-29 DIAGNOSIS — Z7689 Persons encountering health services in other specified circumstances: Secondary | ICD-10-CM | POA: Diagnosis not present

## 2022-11-29 DIAGNOSIS — R7301 Impaired fasting glucose: Secondary | ICD-10-CM | POA: Diagnosis not present

## 2022-11-29 DIAGNOSIS — F419 Anxiety disorder, unspecified: Secondary | ICD-10-CM

## 2022-11-29 DIAGNOSIS — M25551 Pain in right hip: Secondary | ICD-10-CM | POA: Diagnosis not present

## 2022-11-29 MED ORDER — WEGOVY 2.4 MG/0.75ML ~~LOC~~ SOAJ: 2.4000 mg | SUBCUTANEOUS | 3 refills | Status: DC

## 2022-11-29 NOTE — Telephone Encounter (Signed)
Please call patient and let her know organ to put a copy of exercises for her hip in the mail.  I apologize for not giving those to her before she left.

## 2022-11-29 NOTE — Progress Notes (Signed)
   Established Patient Office Visit  Subjective   Patient ID: Kristina Martinez, female    DOB: Oct 26, 1970  Age: 52 y.o. MRN: [id number]  Chief Complaint  Patient presents with   Weight Check    HPI Impaired fasting glucose-no increased thirst or urination. No symptoms consistent with hypoglycemia.  F/u -weight management.  Up to 1.7 mg on Wegovy.  Tolerating well without any problems or side effects.  She would like to go up to the neck strength if possible.  She is down to 229 pounds.  She still continuing to work on [employer].  Exercise has still been hit or miss.  Unfortunately she has been having some joint pain that is been limiting her exercise ability.  Also been having some anterior right hip pain that radiates slightly into the upper thigh.  It has been making it more difficult to exercise.    ROS    Objective:     BP 127/78   Pulse 93   Ht 5\' 6"  (1.676 m)   Wt 229 lb (103.9 kg)   LMP 03/09/2020 (Exact Date)   SpO2 99%   BMI 36.96 kg/m    Physical Exam Vitals and nursing note reviewed.  Constitutional:      Appearance: She is well-developed.  HENT:     Head: Normocephalic and atraumatic.  Cardiovascular:     Rate and Rhythm: Normal rate and regular rhythm.     Heart sounds: Normal heart sounds.  Pulmonary:     Effort: Pulmonary effort is normal.     Breath sounds: Normal breath sounds.  Musculoskeletal:     Comments: Able to flex hip without significant difficulty but has pain with internal and external rotation.  Skin:    General: Skin is warm and dry.  Neurological:     Mental Status: She is alert and oriented to person, place, and time.  Psychiatric:        Behavior: Behavior normal.      No results found for any visits on 11/29/22.    The 10-year ASCVD risk score (Arnett DK, et al., 2019) is: 2.9%    Assessment & Plan:   Problem List Items Addressed This Visit       Endocrine   IFG (impaired fasting glucose) - Primary      Other   Encounter for weight management    Visit #: 3 Starting Weight: 247 lbs    Current weight:  Previous weight: 241 lbs  Change in weight:  Goal weight: 175 lbs  Dietary goals: continue to work on increasing vegetable intake and portion control Exercise goals: Stay active  Medication: On Wegovy 1.7 mg.  PA denied Follow-up and referrals: 8 weeks      Anxiety    Uses xanax prn. Not on a daily controller.       Other Visit Diagnoses     Right hip pain          Right Hip pain-will give her exercises to do on her own at home.  Suspect OA.  Recommend in the meantime doing some weights and chair exercises if not improving please let us know.  Return in about 2 months (around 02/01/2023).    Nani Gasser, MD

## 2022-11-29 NOTE — Assessment & Plan Note (Signed)
Uses xanax prn. Not on a daily controller.

## 2022-11-29 NOTE — Assessment & Plan Note (Addendum)
Visit #: 3 Starting Weight: 247 lbs    Current weight:  Previous weight: 241 lbs  Change in weight:  Goal weight: 175 lbs  Dietary goals: continue to work on increasing vegetable intake and portion control Exercise goals: Stay active  Medication: On Wegovy 1.7 mg.  PA denied Follow-up and referrals: 8 weeks

## 2022-12-01 NOTE — Telephone Encounter (Signed)
Call and lvm advised pt of exercises being mailed

## 2022-12-07 ENCOUNTER — Ambulatory Visit: Payer: Self-pay

## 2023-01-01 ENCOUNTER — Other Ambulatory Visit: Payer: Self-pay | Admitting: Family Medicine

## 2023-01-01 DIAGNOSIS — F988 Other specified behavioral and emotional disorders with onset usually occurring in childhood and adolescence: Secondary | ICD-10-CM

## 2023-01-03 MED ORDER — METHYLPHENIDATE HCL 10 MG PO TABS: 10.0000 mg | ORAL_TABLET | Freq: Two times a day (BID) | ORAL | 0 refills | Status: DC

## 2023-01-03 MED ORDER — ONDANSETRON 8 MG PO TBDP: 8.0000 mg | ORAL_TABLET | Freq: Three times a day (TID) | ORAL | 0 refills | Status: DC | PRN

## 2023-01-31 ENCOUNTER — Encounter: Payer: Self-pay | Admitting: Family Medicine

## 2023-01-31 ENCOUNTER — Ambulatory Visit (INDEPENDENT_AMBULATORY_CARE_PROVIDER_SITE_OTHER): Payer: 59 | Admitting: Family Medicine

## 2023-01-31 VITALS — BP 135/83 | HR 83 | Ht 66.0 in | Wt 220.0 lb

## 2023-01-31 DIAGNOSIS — M79671 Pain in right foot: Secondary | ICD-10-CM

## 2023-01-31 DIAGNOSIS — Z7689 Persons encountering health services in other specified circumstances: Secondary | ICD-10-CM

## 2023-01-31 DIAGNOSIS — M79672 Pain in left foot: Secondary | ICD-10-CM | POA: Diagnosis not present

## 2023-01-31 MED ORDER — WEGOVY 2.4 MG/0.75ML ~~LOC~~ SOAJ: 2.4000 mg | SUBCUTANEOUS | 3 refills | Status: DC

## 2023-01-31 NOTE — Assessment & Plan Note (Addendum)
Visit #: 4 Starting Weight: 247 lbs    Current weight:  220 lbs , down 11 % body weight.   Previous weight: 229 lbs  Change in weight: down 9 lbs  Goal weight: 175 lbs  Dietary goals: continue to work on increasing vegetable intake and portion control Exercise goals: Stay active, some walking Medication: On Wegovy 2.4 mg.  PA denied Follow-up and referrals: 8 weeks

## 2023-01-31 NOTE — Progress Notes (Signed)
   Established Patient Office Visit  Subjective   Patient ID: Kristina Martinez, female    DOB: 05-21-1971  Age: 52 y.o. MRN: [id number]  Chief Complaint  Patient presents with   Weight Check    HPI  F/U weight mgt -she is doing really well on the semaglutide 2.4 mg.  She is lost about 9 pounds in the last 8 weeks.  Continuing to eat smaller portions she is trying to eat healthy for lunch usually takes her lunch to work.  She is still trying to work on increasing her vegetable intake.  She has not been able to exercise consistently because she is still having some right anterior hip pain that seems to radiate up towards her umbilicus.  She is also struggling with some bilateral foot pain over the lateral metatarsals.  She wonders if she could see a foot specialist.      ROS    Objective:     BP 135/83   Pulse 83   Ht 5\' 6"  (1.676 m)   Wt 220 lb (99.8 kg)   LMP 03/09/2020 (Exact Date)   SpO2 95%   BMI 35.51 kg/m    Physical Exam Vitals and nursing note reviewed.  Constitutional:      Appearance: She is well-developed.  HENT:     Head: Normocephalic and atraumatic.  Cardiovascular:     Rate and Rhythm: Normal rate and regular rhythm.     Heart sounds: Normal heart sounds.  Pulmonary:     Effort: Pulmonary effort is normal.     Breath sounds: Normal breath sounds.  Skin:    General: Skin is warm and dry.  Neurological:     Mental Status: She is alert and oriented to person, place, and time.  Psychiatric:        Behavior: Behavior normal.      No results found for any visits on 01/31/23.    The 10-year ASCVD risk score (Arnett DK, et al., 2019) is: 3.3%    Assessment & Plan:   Problem List Items Addressed This Visit       Other   Encounter for weight management - Primary    Visit #: 4 Starting Weight: 247 lbs    Current weight:  220 lbs , down 11 % body weight.   Previous weight: 229 lbs  Change in weight: down 9 lbs  Goal weight: 175 lbs  Dietary  goals: continue to work on increasing vegetable intake and portion control Exercise goals: Stay active, some walking Medication: On Wegovy 2.4 mg.  PA denied Follow-up and referrals: 8 weeks      Other Visit Diagnoses     Bilateral foot pain       Relevant Orders   Ambulatory referral to Podiatry       Lateral foot pain-refer to podiatry for further evaluation.  Right hip pain-given exercises for hip flexor strain.  If not improving over the next couple weeks please let us know.  Return in about 9 weeks (around 04/04/2023).    Nani Gasser, MD

## 2023-02-24 ENCOUNTER — Ambulatory Visit (INDEPENDENT_AMBULATORY_CARE_PROVIDER_SITE_OTHER): Payer: 59 | Admitting: Podiatry

## 2023-02-24 ENCOUNTER — Encounter: Payer: Self-pay | Admitting: Podiatry

## 2023-02-24 ENCOUNTER — Ambulatory Visit: Payer: 59

## 2023-02-24 DIAGNOSIS — M778 Other enthesopathies, not elsewhere classified: Secondary | ICD-10-CM | POA: Diagnosis not present

## 2023-02-24 DIAGNOSIS — M7671 Peroneal tendinitis, right leg: Secondary | ICD-10-CM

## 2023-02-24 MED ORDER — DICLOFENAC SODIUM 75 MG PO TBEC
75.0000 mg | DELAYED_RELEASE_TABLET | Freq: Two times a day (BID) | ORAL | 0 refills | Status: DC
Start: 2023-02-24 — End: 2023-03-11

## 2023-02-24 NOTE — Patient Instructions (Signed)
Peroneal Tendinopathy Rehab Ask your health care provider which exercises are safe for you. Do exercises exactly as told by your health care provider and adjust them as directed. It is normal to feel mild stretching, pulling, tightness, or discomfort as you do these exercises. Stop right away if you feel sudden pain or your pain gets worse. Do not begin these exercises until told by your health care provider. Stretching and range-of-motion exercises These exercises warm up your muscles and joints. They can help improve the movement and flexibility of your ankle. They may also help to relieve pain and stiffness. Gastrocnemius and soleus stretch, standing This is an exercise in which you stand on a step and use your body weight to stretch your calf muscles. To do this exercise: Stand on the edge of a step on the ball of your left / right foot. The ball of your foot is on the walking surface, right under your toes. Keep your other foot firmly on the same step. Hold on to the wall, a railing, or a chair for balance. Slowly lift your other foot, allowing your body weight to press your left / right heel down over the edge of the step. You should feel a stretch in your left / right calf (gastrocnemius and soleus). Hold this position for __________ seconds. Return both feet to the step. Repeat this exercise with a slight bend in your left / right knee. Repeat __________ times with your left / right knee straight and __________ times with your left / right knee bent. Complete this exercise __________ times a day. Strengthening exercises These exercises build strength and endurance in your foot and ankle. Endurance is the ability to use your muscles for a long time, even after they get tired. Ankle dorsiflexion with band  Secure a rubber exercise band or tube to an object, such as a table leg, that will not move when the band is pulled. Secure the other end of the band around your left / right foot. Sit on  the floor. Face the object with your left / right leg extended. The band or tube should be slightly tense when your foot is relaxed. Slowly flex your left / right ankle and toes to bring your foot toward you (dorsiflexion). Hold this position for __________ seconds. Let the band or tube slowly pull your foot back to the starting position. Repeat __________ times. Complete this exercise __________ times a day. Ankle eversion  Sit on the floor with your legs straight out in front of you. Loop a rubber exercise band or tube around the ball of your left / right foot. The ball of your foot is on the walking surface, right under your toes. Hold the ends of the band in your hands. You can also secure the band to a stable object. The band or tube should be slightly tense when your foot is relaxed. Slowly push your foot outward, away from your other leg (eversion). Hold this position for __________ seconds. Slowly return your foot to the starting position. Repeat __________ times. Complete this exercise __________ times a day. Plantar flexion, standing This exercise is sometimes called a standing heel raise. Stand with your feet shoulder-width apart. Place your hands on a wall or table to steady yourself as needed. Try not to use it for support. Keep your weight spread evenly over the width of your feet while you slowly rise up on your toes (plantar flexion). If told by your health care provider: Shift your weight   toward your left / right leg until you feel challenged. Stand on your left / right leg only. Hold this position for __________ seconds. Repeat __________ times. Complete this exercise __________ times a day. Single leg stand  Without shoes, stand near a railing or in a doorway. You may hold on to the railing or doorframe as needed. Stand on your left / right foot. Keep your big toe down on the floor and try to keep your arch lifted. Do not roll to the outside of your foot. If this  exercise is too easy, you can try it with your eyes closed or while standing on a pillow. Hold this position for __________ seconds. Repeat __________ times. Complete this exercise __________ times a day. This information is not intended to replace advice given to you by your health care provider. Make sure you discuss any questions you have with your health care provider. Document Revised: 10/08/2021 Document Reviewed: 10/08/2021 Elsevier Patient Education  2024 [employer]  

## 2023-02-24 NOTE — Progress Notes (Signed)
  Subjective:  Patient ID: Kristina Martinez, female    DOB: [birth year],   MRN: [id number]  Chief Complaint  Patient presents with   Foot Pain    Pt present today for bil foot pain that has been going on for 3 months and it has not gotten better.    52 y.o. female presents for concern of bilateral foot pain that has been going on for about three months. Relates the outside of both feet have been tender at all times but worse when on them. Relates the base of the fifth metatarsal on the right and around the fifth toe on the left.  Denies any current treatments.   . Denies any other pedal complaints. Denies n/v/f/c.   Past Medical History:  Diagnosis Date   Achilles tendon injury    ADD (attention deficit disorder)    Depression    IBS (irritable bowel syndrome)    Migraine    Obese    Ovarian cyst    PMDD (premenstrual dysphoric disorder)    Vaso-vagal reaction     Objective:  Physical Exam: Vascular: DP/PT pulses 2/4 bilateral. CFT <3 seconds. Normal hair growth on digits. No edema.  Skin. No lacerations or abrasions bilateral feet.  Musculoskeletal: MMT 5/5 bilateral lower extremities in DF, PF, Inversion and Eversion. Deceased ROM in DF of ankle joint. Tender to the base of the fifth metatarsal on the right as well as tenderness pin point over the cuboid. No pain with inversion eversion PF or DF. Pain to the dital fifth metatarsal head on the left. No pain with ROM of the digit. No pain proximally.  Neurological: Sensation intact to light touch.   Assessment:   1. Capsulitis of right foot   2. Peroneal tendonitis, right [M76.71]   3. Capsulitis of left foot      Plan:  Patient was evaluated and treated and all questions answered. X-rays reviewed and discussed with patient. No acute fractures of dislocations  Discussed peroneal tendinitis vs stress vs capsulitis and treatment options at length with patient Discussed stretching exercises and provided handout. Prescription for  diclofenac provided. Previous CMP Cr and BUN normal range.  Dispensed ankle brace. Her size not available and discussed where to get them.  Discussed injection but deferred.  Discussed that if the symptoms do not improve can consider PT/MRI. Patient to return in 6 to 8 weeks or sooner if symptoms fail to improve or worsen.   Louann Sjogren, DPM

## 2023-03-11 ENCOUNTER — Other Ambulatory Visit: Payer: Self-pay | Admitting: Podiatry

## 2023-04-02 ENCOUNTER — Other Ambulatory Visit: Payer: Self-pay | Admitting: Podiatry

## 2023-04-04 ENCOUNTER — Ambulatory Visit (INDEPENDENT_AMBULATORY_CARE_PROVIDER_SITE_OTHER): Payer: 59 | Admitting: Family Medicine

## 2023-04-04 ENCOUNTER — Encounter: Payer: Self-pay | Admitting: Family Medicine

## 2023-04-04 VITALS — BP 124/78 | HR 94 | Temp 98.6°F | Ht 66.0 in | Wt 205.0 lb

## 2023-04-04 DIAGNOSIS — D500 Iron deficiency anemia secondary to blood loss (chronic): Secondary | ICD-10-CM

## 2023-04-04 DIAGNOSIS — R7301 Impaired fasting glucose: Secondary | ICD-10-CM | POA: Diagnosis not present

## 2023-04-04 DIAGNOSIS — F988 Other specified behavioral and emotional disorders with onset usually occurring in childhood and adolescence: Secondary | ICD-10-CM | POA: Diagnosis not present

## 2023-04-04 DIAGNOSIS — Z23 Encounter for immunization: Secondary | ICD-10-CM

## 2023-04-04 DIAGNOSIS — F419 Anxiety disorder, unspecified: Secondary | ICD-10-CM

## 2023-04-04 DIAGNOSIS — Z7689 Persons encountering health services in other specified circumstances: Secondary | ICD-10-CM

## 2023-04-04 MED ORDER — ALPRAZOLAM 0.5 MG PO TABS
ORAL_TABLET | ORAL | 1 refills | Status: DC
Start: 2023-04-04 — End: 2024-01-09

## 2023-04-04 MED ORDER — METHYLPHENIDATE HCL 10 MG PO TABS
10.0000 mg | ORAL_TABLET | Freq: Two times a day (BID) | ORAL | 0 refills | Status: DC
Start: 2023-04-04 — End: 2024-01-09

## 2023-04-04 MED ORDER — ONDANSETRON 8 MG PO TBDP
8.0000 mg | ORAL_TABLET | Freq: Three times a day (TID) | ORAL | 1 refills | Status: DC | PRN
Start: 2023-04-04 — End: 2023-06-13

## 2023-04-04 NOTE — Assessment & Plan Note (Addendum)
Visit #: 5 Starting Weight: 247 lbs    Current weight:  205 lbs , down  17 % body weight.   Previous weight: 220 lbs  Change in weight: down 15 lbs  Goal weight: 175 lbs  Dietary goals: continue to work on increasing vegetable intake and portion control Exercise goals: Stay active, some walking Medication: On Wegovy 2.4 mg.  PA denied Follow-up and referrals: 8 weeks

## 2023-04-04 NOTE — Assessment & Plan Note (Signed)
Well controlled. Continue current regimen. Follow up in  6 mo  

## 2023-04-04 NOTE — Assessment & Plan Note (Signed)
Taking her iron regularly.

## 2023-04-04 NOTE — Assessment & Plan Note (Signed)
RF Xanax. For PRN use.

## 2023-04-04 NOTE — Progress Notes (Signed)
Established Patient Office Visit  Subjective   Patient ID: Kristina Martinez, female    DOB: July 28, 1970  Age: 52 y.o. MRN: [id number]  Chief Complaint  Patient presents with   Weight Check        ADD    HPI  ADD - Reports symptoms are well controlled on current regime. Denies any problems with insomnia, chest pain, palpitations, or SOB.     F/U weight mgt -she is actually doing really well on the semaglutide.  She really feels like she has hit a really good place with the medication she is tolerating it well she really has not had any problems with constipation.  He has been trying to get some walking in.  She does take her extra iron and has been tolerating that well.  Still working on bathroom remodel.      ROS    Objective:     BP 124/78   Pulse 94   Temp 98.6 F (37 C)   Ht 5\' 6"  (1.676 m)   Wt 205 lb (93 kg)   LMP 03/09/2020 (Exact Date)   SpO2 97%   BMI 33.09 kg/m    Physical Exam Vitals and nursing note reviewed.  Constitutional:      Appearance: Normal appearance.  HENT:     Head: Normocephalic and atraumatic.  Eyes:     Conjunctiva/sclera: Conjunctivae normal.  Cardiovascular:     Rate and Rhythm: Normal rate and regular rhythm.  Pulmonary:     Effort: Pulmonary effort is normal.     Breath sounds: Normal breath sounds.  Skin:    General: Skin is warm and dry.  Neurological:     Mental Status: She is alert.  Psychiatric:        Mood and Affect: Mood normal.      No results found for any visits on 04/04/23.    The 10-year ASCVD risk score (Arnett DK, et al., 2019) is: 2.8%    Assessment & Plan:   Problem List Items Addressed This Visit       Endocrine   IFG (impaired fasting glucose)    Well controlled. Continue current regimen. Follow up in  6 mo        Other   Iron deficiency anemia due to chronic blood loss    Taking her iron regularly.       Encounter for weight management - Primary    Visit #: 5 Starting Weight:  247 lbs    Current weight:  205 lbs , down  17 % body weight.   Previous weight: 220 lbs  Change in weight: down 15 lbs  Goal weight: 175 lbs  Dietary goals: continue to work on increasing vegetable intake and portion control Exercise goals: Stay active, some walking Medication: On Wegovy 2.4 mg.  PA denied Follow-up and referrals: 8 weeks      Relevant Medications   ondansetron (ZOFRAN ODT) 8 MG disintegrating tablet   Attention deficit disorder    Well controlled. Continue current regimen. Follow up in  69mo       Relevant Medications   methylphenidate (RITALIN) 10 MG tablet   Anxiety    RF Xanax. For PRN use.        Relevant Medications   ALPRAZolam (XANAX) 0.5 MG tablet   Other Visit Diagnoses     Encounter for immunization       Relevant Orders   Flu vaccine trivalent PF, 69mos and older(Flulaval,Afluria,Fluarix,Fluzone) (Completed)  Return in about 9 weeks (around 06/06/2023) for Weight mgt.    Nani Gasser, MD

## 2023-04-07 ENCOUNTER — Ambulatory Visit: Payer: 59 | Admitting: Podiatry

## 2023-06-13 ENCOUNTER — Ambulatory Visit (INDEPENDENT_AMBULATORY_CARE_PROVIDER_SITE_OTHER): Payer: 59 | Admitting: Family Medicine

## 2023-06-13 ENCOUNTER — Ambulatory Visit: Payer: 59

## 2023-06-13 VITALS — BP 132/83 | HR 72 | Ht 66.5 in | Wt 200.2 lb

## 2023-06-13 DIAGNOSIS — R7301 Impaired fasting glucose: Secondary | ICD-10-CM | POA: Diagnosis not present

## 2023-06-13 DIAGNOSIS — G8929 Other chronic pain: Secondary | ICD-10-CM

## 2023-06-13 DIAGNOSIS — Z7689 Persons encountering health services in other specified circumstances: Secondary | ICD-10-CM

## 2023-06-13 DIAGNOSIS — M4317 Spondylolisthesis, lumbosacral region: Secondary | ICD-10-CM

## 2023-06-13 DIAGNOSIS — M5416 Radiculopathy, lumbar region: Secondary | ICD-10-CM

## 2023-06-13 MED ORDER — PREDNISONE 20 MG PO TABS: 40.0000 mg | ORAL_TABLET | Freq: Every day | ORAL | 0 refills | Status: DC

## 2023-06-13 MED ORDER — ONDANSETRON 8 MG PO TBDP
8.0000 mg | ORAL_TABLET | Freq: Three times a day (TID) | ORAL | 1 refills | Status: AC | PRN
Start: 2023-06-13 — End: ?

## 2023-06-13 NOTE — Progress Notes (Signed)
Established Patient Office Visit  Subjective  Patient ID: Kristina Martinez, female    DOB: 1971/03/16  Age: 52 y.o. MRN: [id number]  Chief Complaint  Patient presents with   Weight Management Screening   Sciatica    Constant bilateral sciatica pain x around one year     HPI  Follow-up weight management-currently on Wegovy 2.4 mg. Tolerating well. Bowels are moving well. Work on controlling stress sating.  Has noticed the weight loss has slowed down a little bit.  Moving her bowels normally.  Also c/o of bilat lower back pain, very low near tailbone and radiating towards both hips. Occ down leg. Sometimes on right and sometime on left.  Sxs present x 1 year.  Specific injury or trauma.  Does feel like it is gradually gotten worse recently.    ROS    Objective:     BP 132/83 (BP Location: Left Arm, Patient Position: Sitting, Cuff Size: Normal)   Pulse 72   Ht 5' 6.5" (1.689 m)   Wt 200 lb 4 oz (90.8 kg)   LMP 03/09/2020 (Exact Date)   SpO2 100%   BMI 31.84 kg/m    Physical Exam Vitals and nursing note reviewed.  Constitutional:      Appearance: Normal appearance.  HENT:     Head: Normocephalic and atraumatic.  Eyes:     Conjunctiva/sclera: Conjunctivae normal.  Cardiovascular:     Rate and Rhythm: Normal rate and regular rhythm.  Pulmonary:     Effort: Pulmonary effort is normal.     Breath sounds: Normal breath sounds.  Musculoskeletal:     Comments: Nontender over SI joints.  Negative straight leg raise bilaterally.  Hip, knee, ankle strength is 5 out of 5.  Patellar reflexes 0+ bilaterally.  Skin:    General: Skin is warm and dry.  Neurological:     Mental Status: She is alert.  Psychiatric:        Mood and Affect: Mood normal.      No results found for any visits on 06/13/23.    The 10-year ASCVD risk score (Arnett DK, et al., 2019) is: 3.2%    Assessment & Plan:   Problem List Items Addressed This Visit       Endocrine   IFG (impaired  fasting glucose) - Primary   Plan to recheck A1c at next office visit.        Other   Encounter for weight management   Visit #: 5 Starting Weight: 247 lbs    Current weight: 200, down    body weight.   Previous weight: 205 lbs  Change in weight: down 5 lbs  Goal weight: 175 lbs  Dietary goals: continue to work on increasing vegetable intake and portion control Exercise goals: Stay active, some walking Medication: On Wegovy 2.4 mg.  PA denied Follow-up and referrals: 8 weeks      Relevant Medications   ondansetron (ZOFRAN ODT) 8 MG disintegrating tablet   Other Visit Diagnoses       Chronic radicular low back pain       Relevant Medications   ondansetron (ZOFRAN ODT) 8 MG disintegrating tablet   predniSONE (DELTASONE) 20 MG tablet   Other Relevant Orders   DG Lumbar Spine Complete   DG Sacrum/Coccyx       Low back radiculopathy, bilateral-we will go ahead and treat with prednisone.  Will get plain film x-rays for further workup handout given with stretches to do on her own at  home for home physical therapy.  Return in about 2 months (around 08/14/2023).    Nani Gasser, MD

## 2023-06-13 NOTE — Assessment & Plan Note (Signed)
Plan to recheck A1c at next office visit.

## 2023-06-13 NOTE — Assessment & Plan Note (Addendum)
Visit #: 5 Starting Weight: 247 lbs    Current weight: 200, down    body weight.   Previous weight: 205 lbs  Change in weight: down 5 lbs  Goal weight: 175 lbs  Dietary goals: continue to work on increasing vegetable intake and portion control Exercise goals: Stay active, some walking Medication: On Wegovy 2.4 mg.  PA denied Follow-up and referrals: 8 weeks

## 2023-06-27 DIAGNOSIS — M4317 Spondylolisthesis, lumbosacral region: Secondary | ICD-10-CM | POA: Insufficient documentation

## 2023-06-27 NOTE — Addendum Note (Signed)
Addended by: Nani Gasser D on: 06/27/2023 04:52 PM   Modules accepted: Orders

## 2023-06-27 NOTE — Progress Notes (Signed)
Hi Kristina Martinez, x-ray shows anterolisthesis of the L5 vertebrae on S1 that means that it shifted out of alignment by about 7 mm.  This is chronic and has been seen before compared to old film in 2022.  Is also some disc space narrowing and spurring at L4-5 and L5-S1 and some arthritis of the hinge points of the spine at L4-5 and L5-S1.  The SI joints were the hip meets the spine shows some arthritis and spurring as well.  We have a couple of options we can get you back into physical therapy for your back and try to see if we can get you back down to your baseline state of pain and discomfort.  Or I can refer you to sports med to discuss treatment options as well.  Let me know what you would prefer to do.

## 2023-06-27 NOTE — Progress Notes (Signed)
  Orders Placed This Encounter  Procedures   DG Lumbar Spine Complete    Standing Status:   Future    Number of Occurrences:   1    Expiration Date:   06/12/2024    Reason for Exam (SYMPTOM  OR DIAGNOSIS REQUIRED):   low back pain radiating bilat    Is patient pregnant?:   No    Preferred imaging location?:   [facility]   DG Sacrum/Coccyx    Standing Status:   Future    Number of Occurrences:   1    Expiration Date:   06/12/2024    Reason for Exam (SYMPTOM  OR DIAGNOSIS REQUIRED):   bilat low back ain radiating into buttocks pain at tailbone area    Is patient pregnant?:   No    Preferred imaging location?:   [facility]   Ambulatory referral to Physical Therapy    Referral Priority:   Routine    Referral Type:   Physical Medicine    Referral Reason:   Specialty Services Required    Requested Specialty:   Physical Therapy    Number of Visits Requested:   1

## 2023-06-28 ENCOUNTER — Encounter: Payer: Self-pay | Admitting: Family Medicine

## 2023-07-06 ENCOUNTER — Other Ambulatory Visit: Payer: Self-pay | Admitting: *Deleted

## 2023-07-06 MED ORDER — WEGOVY 2.4 MG/0.75ML ~~LOC~~ SOAJ: 2.4000 mg | SUBCUTANEOUS | 3 refills | Status: DC

## 2023-07-11 ENCOUNTER — Ambulatory Visit: Payer: 59

## 2023-08-15 ENCOUNTER — Encounter: Payer: Self-pay | Admitting: Family Medicine

## 2023-08-15 ENCOUNTER — Ambulatory Visit (INDEPENDENT_AMBULATORY_CARE_PROVIDER_SITE_OTHER): Payer: 59 | Admitting: Family Medicine

## 2023-08-15 VITALS — BP 137/85 | HR 81 | Ht 65.5 in | Wt 196.0 lb

## 2023-08-15 DIAGNOSIS — Z7689 Persons encountering health services in other specified circumstances: Secondary | ICD-10-CM

## 2023-08-15 DIAGNOSIS — Z713 Dietary counseling and surveillance: Secondary | ICD-10-CM

## 2023-08-15 NOTE — Assessment & Plan Note (Addendum)
 Visit #: 6 Starting Weight: 247 lbs    Current weight: 196 lbs , down 51 lbs .   Previous weight: 200 lbs  Change in weight: down 4 lbs  Goal weight: 175 lbs  Dietary goals: continue to work on increasing vegetable intake and portion control. Trying to eat out less.  Exercise goals: Start walking again.  Medication: On Wegovy 2.4 mg.  PA denied - paying cash. Discussed switching to Zepbound if plateaus with weight.  Follow-up and referrals: 8 weeks

## 2023-08-15 NOTE — Progress Notes (Signed)
   Established Patient Office Visit  Subjective  Patient ID: Kristina Martinez, female    DOB: 1970/12/01  Age: 53 y.o. MRN: [id number]  Chief Complaint  Patient presents with   Weight Management Screening    HPI  Here for follow-up weight management-she is still doing really well with the medication trying to still focus on eating at home more often her husband does a lot of the cooking.  She did start physical therapy for her hip and has had a couple sessions so far.  She is tolerating the medication well and takes her MiraLAX 2-3 times per week to keep her bowels moving she has not been exercising as much recently but plans on getting back into her walking.    ROS    Objective:     BP 137/85   Pulse 81   Ht 5' 5.5" (1.664 m)   Wt 196 lb (88.9 kg)   LMP 03/09/2020 (Exact Date)   SpO2 100%   BMI 32.12 kg/m    Physical Exam Vitals and nursing note reviewed.  Constitutional:      Appearance: Normal appearance.  HENT:     Head: Normocephalic and atraumatic.  Eyes:     Conjunctiva/sclera: Conjunctivae normal.  Cardiovascular:     Rate and Rhythm: Normal rate and regular rhythm.  Pulmonary:     Effort: Pulmonary effort is normal.     Breath sounds: Normal breath sounds.  Skin:    General: Skin is warm and dry.  Neurological:     Mental Status: She is alert.  Psychiatric:        Mood and Affect: Mood normal.      No results found for any visits on 08/15/23.    The 10-year ASCVD risk score (Arnett DK, et al., 2019) is: 3.4%    Assessment & Plan:   Problem List Items Addressed This Visit       Other   Encounter for weight management - Primary   Visit #: 6 Starting Weight: 247 lbs    Current weight: 196 lbs , down 51 lbs .   Previous weight: 200 lbs  Change in weight: down 4 lbs  Goal weight: 175 lbs  Dietary goals: continue to work on increasing vegetable intake and portion control. Trying to eat out less.  Exercise goals: Start walking again.   Medication: On Wegovy 2.4 mg.  PA denied - paying cash. Discussed switching to Zepbound if plateaus with weight.  Follow-up and referrals: 8 weeks       Return in about 2 months (around 10/13/2023) for Weight Mgt .   I spent 20 minutes on the day of the encounter to include pre-visit record review, face-to-face time with the patient and post visit ordering of test.   Nani Gasser, MD

## 2023-10-10 ENCOUNTER — Encounter: Payer: Self-pay | Admitting: Family Medicine

## 2023-10-10 ENCOUNTER — Ambulatory Visit (INDEPENDENT_AMBULATORY_CARE_PROVIDER_SITE_OTHER): Payer: 59 | Admitting: Family Medicine

## 2023-10-10 VITALS — BP 125/77 | HR 91 | Ht 65.5 in | Wt 195.0 lb

## 2023-10-10 DIAGNOSIS — Z7689 Persons encountering health services in other specified circumstances: Secondary | ICD-10-CM

## 2023-10-10 DIAGNOSIS — E785 Hyperlipidemia, unspecified: Secondary | ICD-10-CM | POA: Diagnosis not present

## 2023-10-10 DIAGNOSIS — R7301 Impaired fasting glucose: Secondary | ICD-10-CM | POA: Diagnosis not present

## 2023-10-10 MED ORDER — ZEPBOUND 12.5 MG/0.5ML ~~LOC~~ SOAJ: 12.5000 mg | SUBCUTANEOUS | 0 refills | Status: DC

## 2023-10-10 NOTE — Progress Notes (Signed)
   Established Patient Office Visit  Subjective  Patient ID: Kristina Martinez, female    DOB: [birth year]  Age: 53 y.o. MRN: [id number]  Chief Complaint  Patient presents with   Weight Check    HPI  Here for follow-up for weight management-she has not been able to do a lot of food prep she has been working a lot of long hours and is actually getting ready to open a second shot.  She has been still engaged with PT for her back but has not been able to exercise regularly.  So due for updated lab work today.  Including A1c for prediabetes.    ROS    Objective:     BP 125/77   Pulse 91   Ht 5' 5.5" (1.664 m)   Wt 195 lb (88.5 kg)   LMP 03/09/2020 (Exact Date)   SpO2 98%   BMI 31.96 kg/m    Physical Exam Vitals and nursing note reviewed.  Constitutional:      Appearance: Normal appearance.  HENT:     Head: Normocephalic and atraumatic.  Eyes:     Conjunctiva/sclera: Conjunctivae normal.  Cardiovascular:     Rate and Rhythm: Normal rate and regular rhythm.  Pulmonary:     Effort: Pulmonary effort is normal.     Breath sounds: Normal breath sounds.  Skin:    General: Skin is warm and dry.  Neurological:     Mental Status: She is alert.  Psychiatric:        Mood and Affect: Mood normal.      No results found for any visits on 10/10/23.    The 10-year ASCVD risk score (Arnett DK, et al., 2019) is: 2.9%    Assessment & Plan:   Problem List Items Addressed This Visit       Endocrine   IFG (impaired fasting glucose)   Relevant Orders   CMP14+EGFR   Lipid Panel With LDL/HDL Ratio   Hemoglobin A1c     Other   HLD (hyperlipidemia)   Relevant Orders   CMP14+EGFR   Lipid Panel With LDL/HDL Ratio   Hemoglobin A1c   Encounter for weight management - Primary   Visit #: 7 Starting Weight: 247 lbs    Current weight:  195 lbs , down 52 lbs .   Previous weight: 196 lbs  Change in weight:  down 1 lb  Goal weight: 175 lbs  Dietary goals: continue to work  on increasing vegetable intake and portion control. Trying to eat out less.  Exercise goals: Start walking again.  Medication: On Wegovy 2.4 mg.  PA denied - paying cash. Discussed switching to Zepbound if plateaus with weight.  Will send new prescription for 12.5 mg to see if we can get it covered. Follow-up and referrals: 12 weeks      Relevant Medications   tirzepatide (ZEPBOUND) 12.5 MG/0.5ML Pen   Other Relevant Orders   CMP14+EGFR   Lipid Panel With LDL/HDL Ratio   Hemoglobin A1c    Return in about 3 months (around 01/09/2024).    Duaine German, MD

## 2023-10-10 NOTE — Assessment & Plan Note (Addendum)
 Visit #: 7 Starting Weight: 247 lbs    Current weight:  195 lbs , down 52 lbs .   Previous weight: 196 lbs  Change in weight:  down 1 lb  Goal weight: 175 lbs  Dietary goals: continue to work on increasing vegetable intake and portion control. Trying to eat out less.  Exercise goals: Start walking again.  Medication: On Wegovy 2.4 mg.  PA denied - paying cash. Discussed switching to Zepbound if plateaus with weight.  Will send new prescription for 12.5 mg to see if we can get it covered. Follow-up and referrals: 12 weeks

## 2023-10-11 ENCOUNTER — Encounter: Payer: Self-pay | Admitting: Family Medicine

## 2023-10-11 LAB — CMP14+EGFR
ALT: 12 IU/L (ref 0–32)
AST: 13 IU/L (ref 0–40)
Albumin: 3.9 g/dL (ref 3.8–4.9)
Alkaline Phosphatase: 150 IU/L — ABNORMAL HIGH (ref 44–121)
BUN/Creatinine Ratio: 11 (ref 9–23)
BUN: 8 mg/dL (ref 6–24)
Bilirubin Total: 0.5 mg/dL (ref 0.0–1.2)
CO2: 21 mmol/L (ref 20–29)
Calcium: 9.1 mg/dL (ref 8.7–10.2)
Chloride: 103 mmol/L (ref 96–106)
Creatinine, Ser: 0.71 mg/dL (ref 0.57–1.00)
Globulin, Total: 2.3 g/dL (ref 1.5–4.5)
Glucose: 108 mg/dL — ABNORMAL HIGH (ref 70–99)
Potassium: 4.4 mmol/L (ref 3.5–5.2)
Sodium: 140 mmol/L (ref 134–144)
Total Protein: 6.2 g/dL (ref 6.0–8.5)
eGFR: 101 mL/min/{1.73_m2} (ref >59)

## 2023-10-11 LAB — HEMOGLOBIN A1C
Est. average glucose Bld gHb Est-mCnc: 108 mg/dL
Hgb A1c MFr Bld: 5.4 % (ref 4.8–5.6)

## 2023-10-11 LAB — LIPID PANEL WITH LDL/HDL RATIO
Cholesterol, Total: 230 mg/dL — ABNORMAL HIGH (ref 100–199)
HDL: 39 mg/dL — ABNORMAL LOW (ref >39)
LDL Chol Calc (NIH): 158 mg/dL — ABNORMAL HIGH (ref 0–99)
LDL/HDL Ratio: 4.1 ratio — ABNORMAL HIGH (ref 0.0–3.2)
Triglycerides: 179 mg/dL — ABNORMAL HIGH (ref 0–149)
VLDL Cholesterol Cal: 33 mg/dL (ref 5–40)

## 2023-10-11 NOTE — Progress Notes (Signed)
 Hi Kristina Martinez, alkaline phosphatase which is a liver enzyme is up just slightly out of the normal range not in a worrisome range but enough that I do want a plan to recheck it again in 6 months.  The 2 additional liver enzymes called AST and ALT are normal.  Total cholesterol and LDL are mildly elevated as well.  But triglycerides actually do look better.  Just encourage you to continue to work on try to get and that regular exercise to reduce your cholesterol levels.  A1c looks great!  Down to 5.4.

## 2023-12-18 ENCOUNTER — Encounter: Payer: Self-pay | Admitting: Family Medicine

## 2023-12-18 DIAGNOSIS — Z7689 Persons encountering health services in other specified circumstances: Secondary | ICD-10-CM

## 2023-12-19 MED ORDER — ZEPBOUND 15 MG/0.5ML ~~LOC~~ SOAJ: 15.0000 mg | SUBCUTANEOUS | 1 refills | Status: DC

## 2023-12-19 NOTE — Telephone Encounter (Signed)
 Requesting rx rf of zepbound  with strength increase Last written as 12.5mg  Last OV 10/10/2023 Upcoming appt 01/09/2024

## 2024-01-09 ENCOUNTER — Ambulatory Visit (INDEPENDENT_AMBULATORY_CARE_PROVIDER_SITE_OTHER): Admitting: Family Medicine

## 2024-01-09 ENCOUNTER — Encounter: Payer: Self-pay | Admitting: Family Medicine

## 2024-01-09 VITALS — BP 113/67 | HR 75 | Ht 65.5 in | Wt 192.1 lb

## 2024-01-09 DIAGNOSIS — F988 Other specified behavioral and emotional disorders with onset usually occurring in childhood and adolescence: Secondary | ICD-10-CM

## 2024-01-09 DIAGNOSIS — Z7689 Persons encountering health services in other specified circumstances: Secondary | ICD-10-CM | POA: Diagnosis not present

## 2024-01-09 DIAGNOSIS — R7301 Impaired fasting glucose: Secondary | ICD-10-CM | POA: Diagnosis not present

## 2024-01-09 DIAGNOSIS — F419 Anxiety disorder, unspecified: Secondary | ICD-10-CM | POA: Diagnosis not present

## 2024-01-09 MED ORDER — METHYLPHENIDATE HCL 10 MG PO TABS: 10.0000 mg | ORAL_TABLET | Freq: Two times a day (BID) | ORAL | 0 refills | Status: DC

## 2024-01-09 MED ORDER — ALPRAZOLAM 0.5 MG PO TABS: ORAL_TABLET | ORAL | 1 refills | Status: AC

## 2024-01-09 NOTE — Assessment & Plan Note (Signed)
 Doing well with current regimen we will go ahead and send refill for 90-day to local pharmacy.

## 2024-01-09 NOTE — Assessment & Plan Note (Signed)
 Lab Results  Component Value Date   HGBA1C 5.4 10/10/2023   Last A1c looked phenomenal.  Will plan to check in October.

## 2024-01-09 NOTE — Assessment & Plan Note (Signed)
 Like a refill on her alprazolam  today she does use it just as needed.

## 2024-01-09 NOTE — Assessment & Plan Note (Addendum)
 Visit #: 7 Starting Weight: 247 lbs    Current weight:  192 lbs , down 55 lbs .   Previous weight: 195 lbs  Change in weight:  down 3 lb  Goal weight: 175 lbs  Dietary goals: continue to work on increasing vegetable intake and portion control. Trying to eat out less.  Exercise goals: Start walking again.  Considering trying a Pilates class. Medication: Now on Zepbound  15mg .   PA denied - paying cash.    Follow-up and referrals: 12 weeks

## 2024-01-09 NOTE — Progress Notes (Signed)
 Established Patient Office Visit  Subjective  Patient ID: Kristina Martinez, female    DOB: 06-02-1971  Age: 53 y.o. MRN: [id number]  Chief Complaint  Patient presents with   Weight Check    HPI F/U Weight mgt - she is doing well. Down 3 more lbs. She has been stress eating a little more since her father passed away about a week ago. He had dementia.  She just went up on dose of the Zepbound  to 15 mg about 3 weeks ago. Still goal to get to around 175.bs.  Not currently exercising but has been looking at doing a Pilates class in [facility] she is interested in doing it.  She definitely likes the Zepbound  pen a little better than the Wegovy  pen she feels like it is easy to administer.  She does need a refill on her methylphenidate .  Doing well and okay with current dose she takes 10 mg twice a day.    ROS    Objective:     BP 113/67   Pulse 75   Ht 5' 5.5 (1.664 m)   Wt 192 lb 1.9 oz (87.1 kg)   LMP 03/09/2020 (Exact Date)   SpO2 97%   BMI 31.48 kg/m    Physical Exam Vitals and nursing note reviewed.  Constitutional:      Appearance: Normal appearance.  HENT:     Head: Normocephalic and atraumatic.  Eyes:     Conjunctiva/sclera: Conjunctivae normal.  Cardiovascular:     Rate and Rhythm: Normal rate and regular rhythm.  Pulmonary:     Effort: Pulmonary effort is normal.     Breath sounds: Normal breath sounds.  Skin:    General: Skin is warm and dry.  Neurological:     Mental Status: She is alert.  Psychiatric:        Mood and Affect: Mood normal.      No results found for any visits on 01/09/24.    The 10-year ASCVD risk score (Arnett DK, et al., 2019) is: 2.5%    Assessment & Plan:   Problem List Items Addressed This Visit       Endocrine   IFG (impaired fasting glucose) - Primary   Lab Results  Component Value Date   HGBA1C 5.4 10/10/2023   Last A1c looked phenomenal.  Will plan to check in October.        Other   Encounter for weight  management   Visit #: 7 Starting Weight: 247 lbs    Current weight:  192 lbs , down 55 lbs .   Previous weight: 195 lbs  Change in weight:  down 3 lb  Goal weight: 175 lbs  Dietary goals: continue to work on increasing vegetable intake and portion control. Trying to eat out less.  Exercise goals: Start walking again.  Considering trying a Pilates class. Medication: Now on Zepbound  15mg .   PA denied - paying cash.    Follow-up and referrals: 12 weeks        Attention deficit disorder   Doing well with current regimen we will go ahead and send refill for 90-day to local pharmacy.      Relevant Medications   methylphenidate  (RITALIN ) 10 MG tablet   Anxiety   Like a refill on her alprazolam  today she does use it just as needed.      Relevant Medications   ALPRAZolam  (XANAX ) 0.5 MG tablet    Return in about 4 months (around 05/11/2024) for Weight management.  SABRA  Dorothyann Byars, MD

## 2024-01-19 ENCOUNTER — Other Ambulatory Visit (HOSPITAL_BASED_OUTPATIENT_CLINIC_OR_DEPARTMENT_OTHER): Payer: Self-pay

## 2024-01-19 MED ORDER — ZEPBOUND 12.5 MG/0.5ML ~~LOC~~ SOAJ
12.5000 mg | SUBCUTANEOUS | 0 refills | Status: DC
  Filled 2024-01-19: qty 2, 28d supply, fill #0

## 2024-01-25 ENCOUNTER — Other Ambulatory Visit (HOSPITAL_COMMUNITY): Payer: Self-pay

## 2024-02-20 IMAGING — DX DG ABDOMEN 1V
2 series · 2 of 2 positions shown · non-contrast
Comparison: 01/23/2018

CLINICAL DATA: Lower abdominal pain for 1 month, initial encounter

EXAM:
ABDOMEN - 1 VIEW

[abdomen kub (1 of 2)]
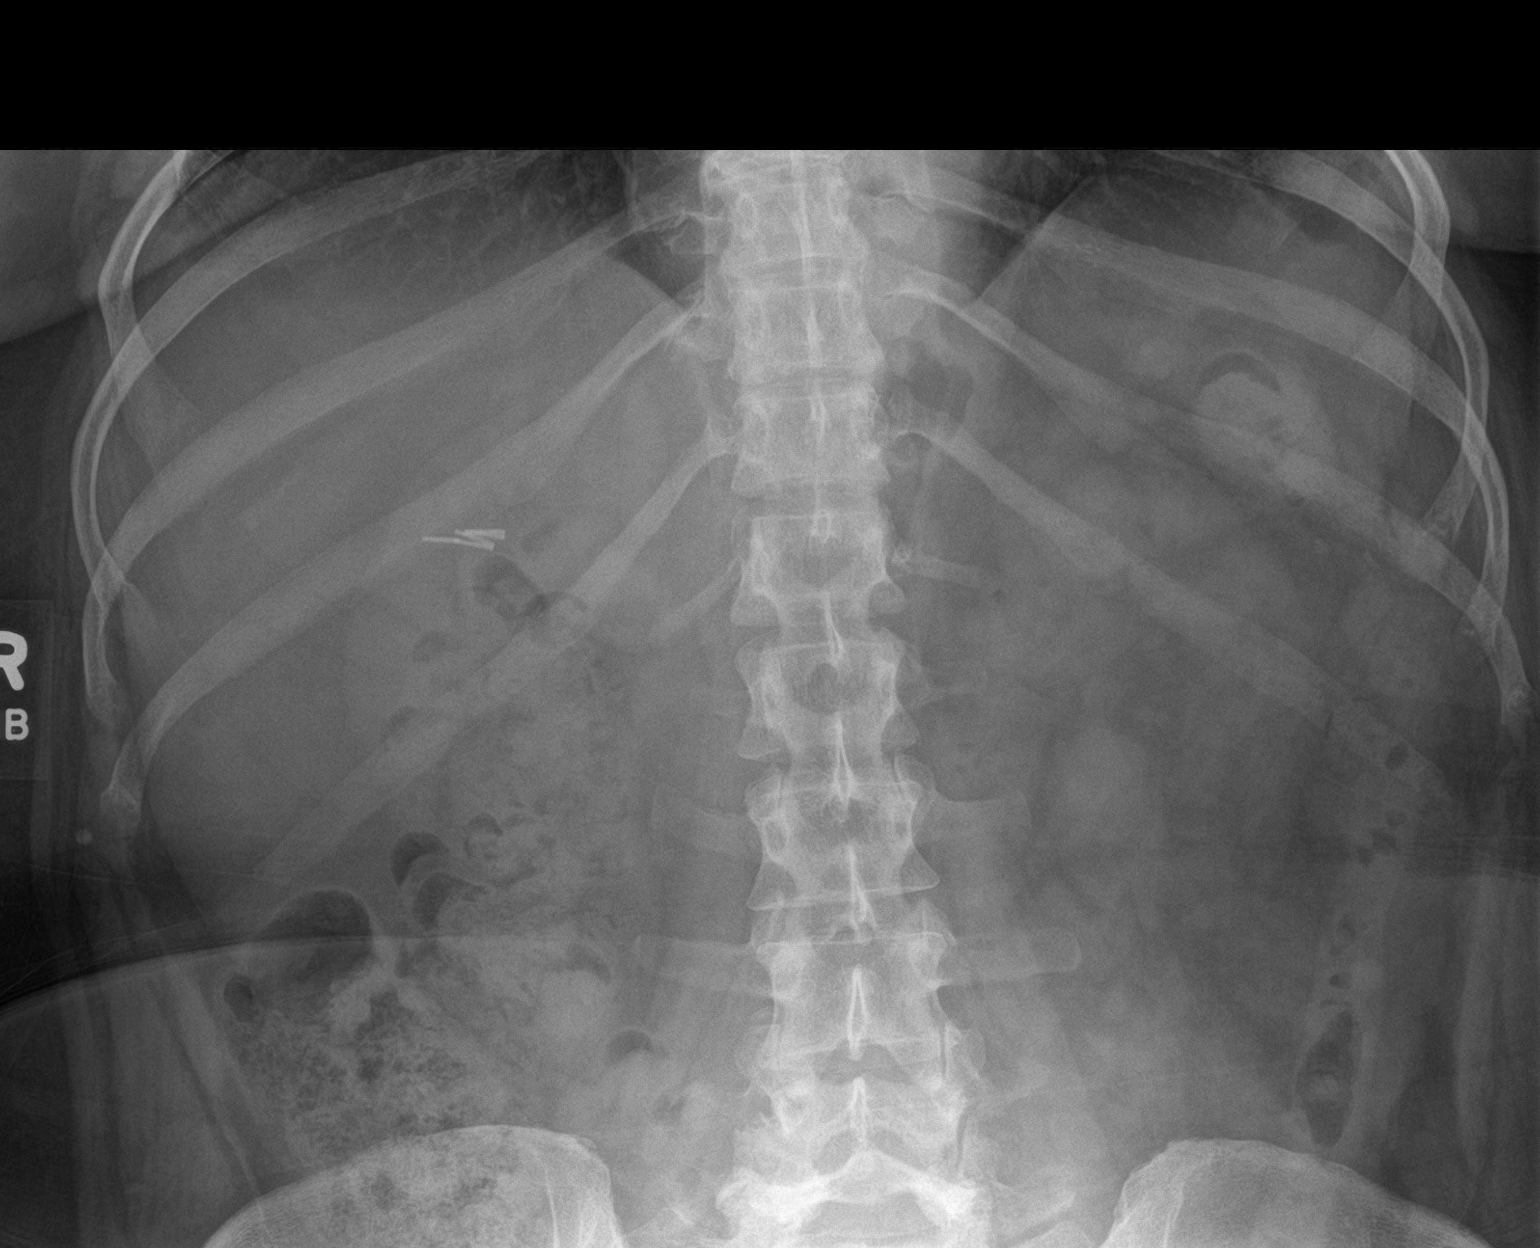

[abdomen kub (2 of 2)]
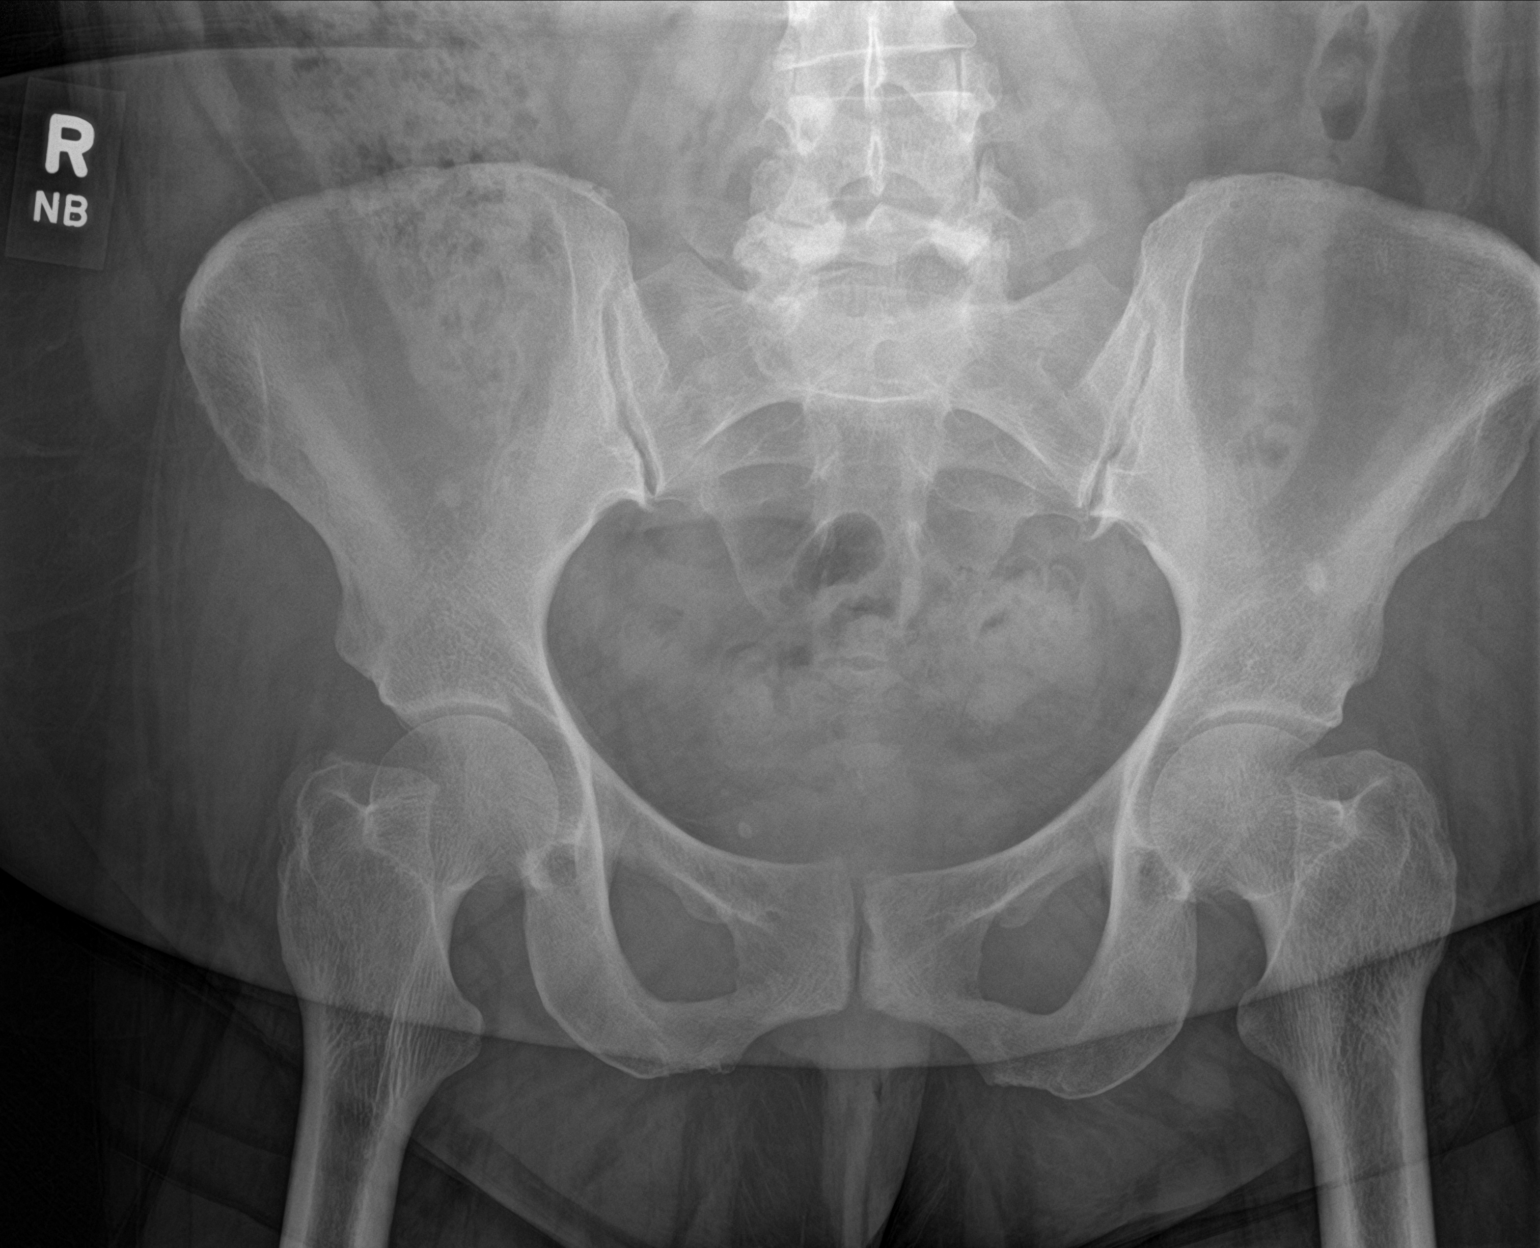

[2 of 2 positions shown; findings below may reference images not displayed]

FINDINGS: Scattered large and small bowel gas is noted. No obstructive changes
are seen. No free air is noted. Changes consistent with prior
cholecystectomy are seen. No acute bony abnormality is noted.
IMPRESSION: No acute abnormality noted.

## 2024-02-22 ENCOUNTER — Other Ambulatory Visit (HOSPITAL_BASED_OUTPATIENT_CLINIC_OR_DEPARTMENT_OTHER): Payer: Self-pay

## 2024-02-23 IMAGING — US US PELVIS COMPLETE WITH TRANSVAGINAL
1 series · 14 of 25 positions shown · non-contrast
Comparison: CT 01/23/2018

CLINICAL DATA: Lower abdominal pain

EXAM:
TRANSABDOMINAL AND TRANSVAGINAL ULTRASOUND OF PELVIS
TECHNIQUE: Both transabdominal and transvaginal ultrasound examinations of the
pelvis were performed. Transabdominal technique was performed for
global imaging of the pelvis including uterus, ovaries, adnexal
regions, and pelvic cul-de-sac. It was necessary to proceed with
endovaginal exam following the transabdominal exam to visualize the
uterus endometrium adnexa.

[Series 1: us pelvic complete with transvaginal · 14 of 75 slices shown]
[im 1/75]
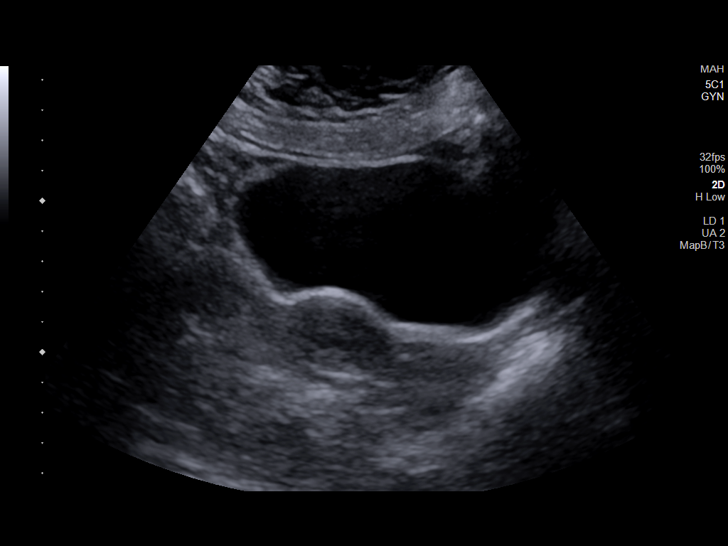
[im 7/75]
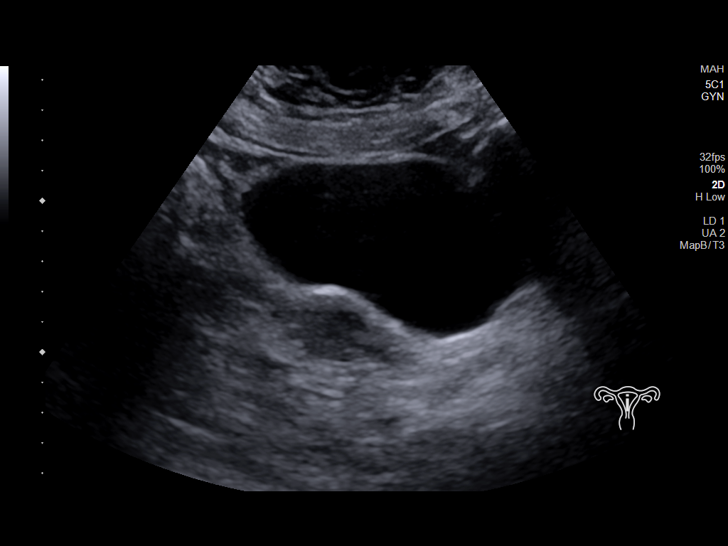
[im 13/75]
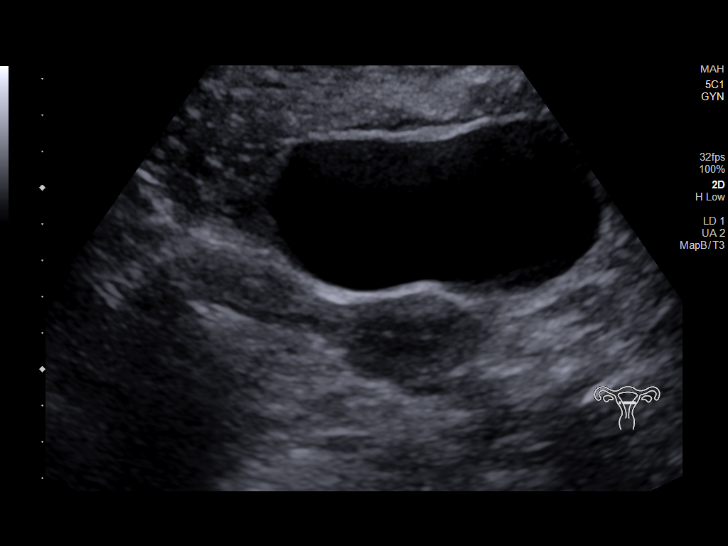
[im 19/75]
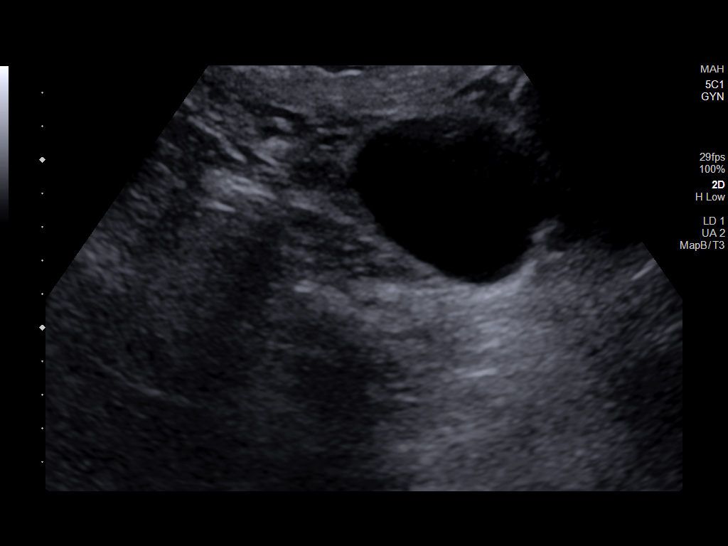
[im 25/75]
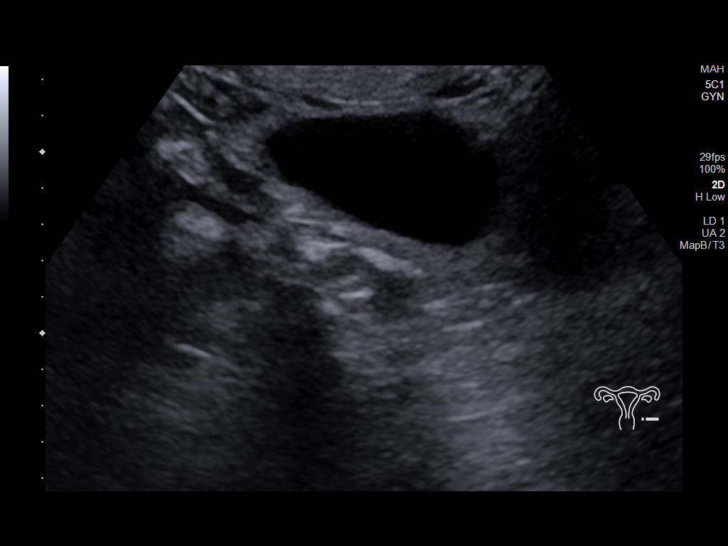
[im 28/75]
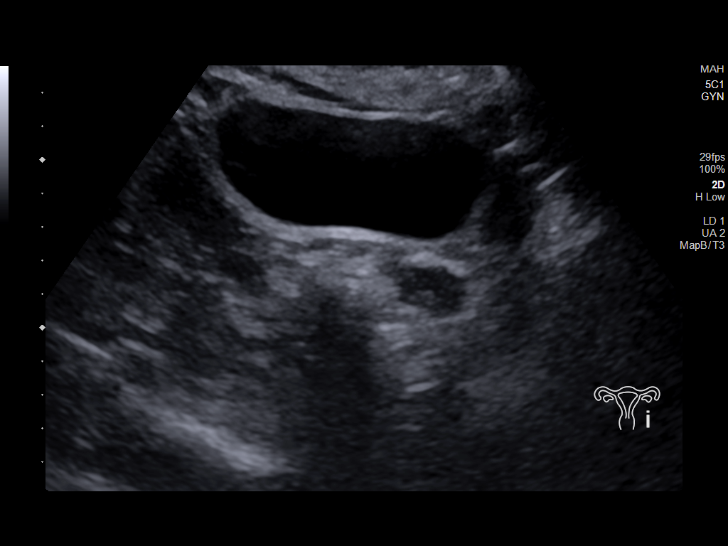
[im 34/75]
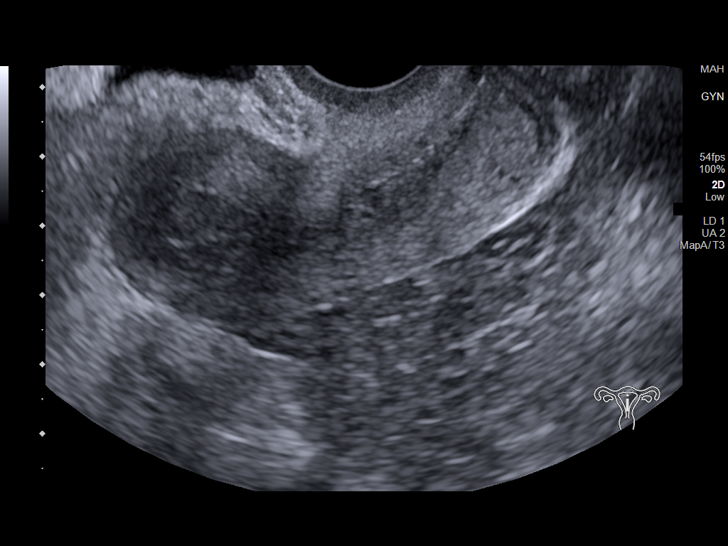
[im 41/75]
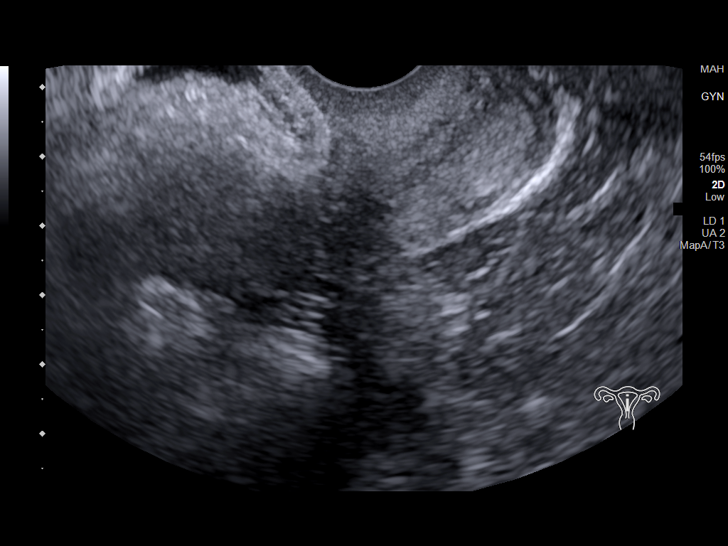
[im 47/75]
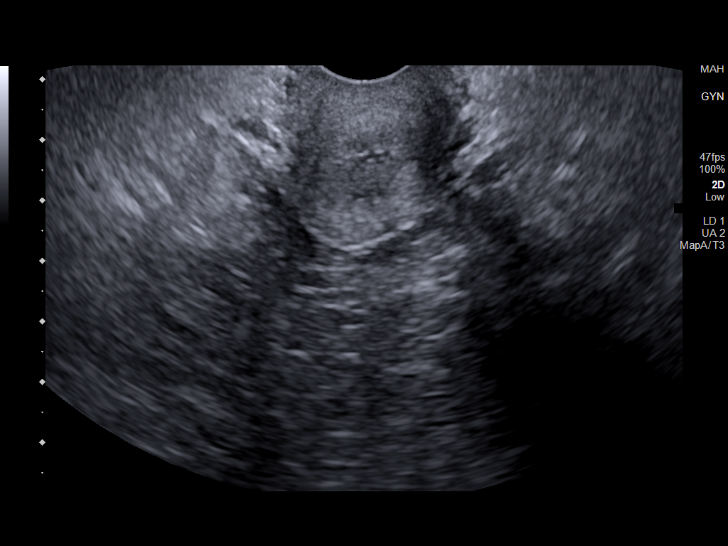
[im 50/75]
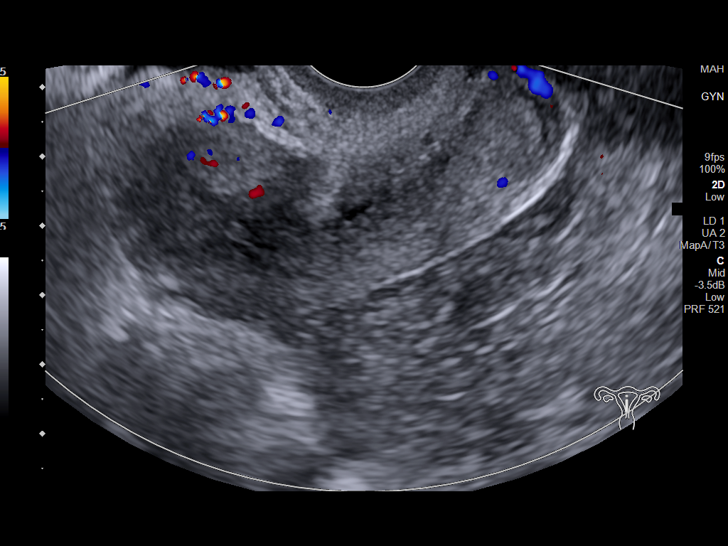
[im 56/75]
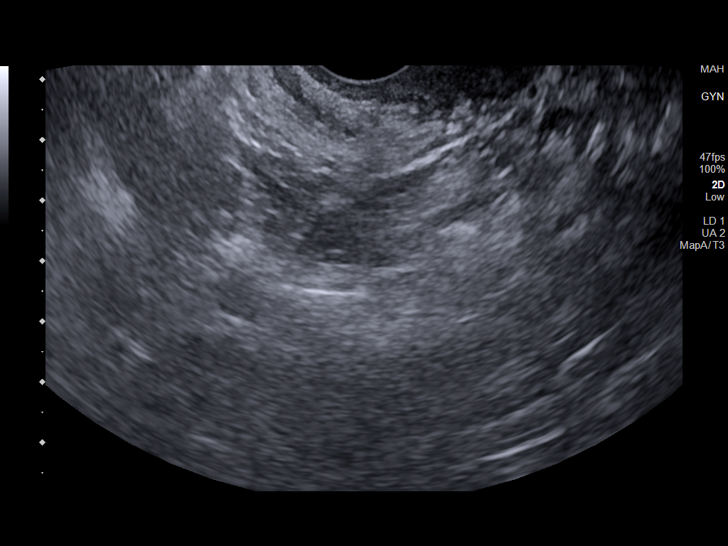
[im 62/75]
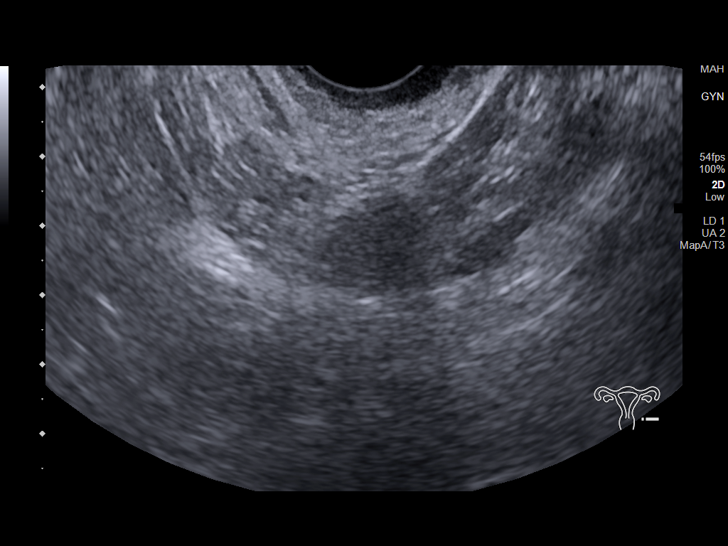
[im 68/75]
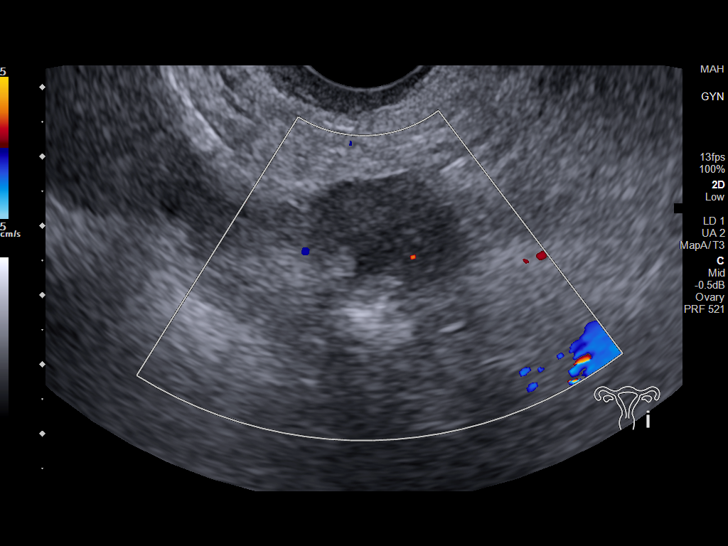
[im 75/75]
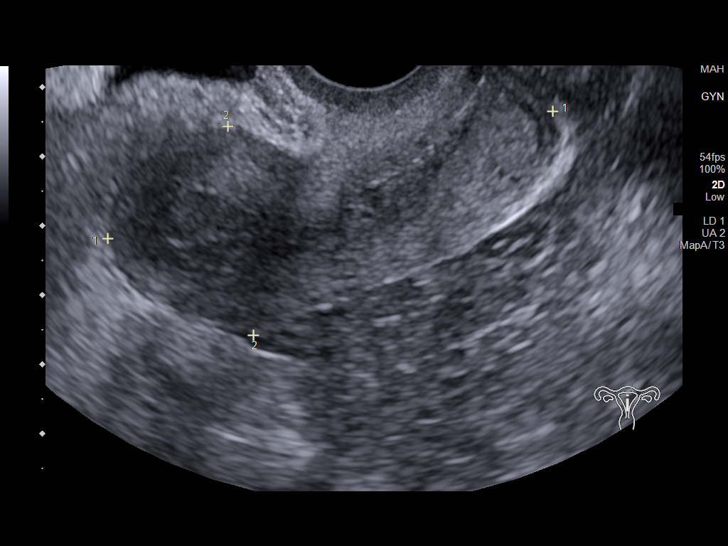

[14 of 25 positions shown; findings below may reference images not displayed]

FINDINGS: Uterus

Measurements: 7.3 x 2.6 x 3.6 cm = volume: 35 mL. No fibroids or
other mass visualized.

Endometrium

Thickness: 8.9 mm.  No focal abnormality visualized.

Right ovary

Not seen.

Left ovary

Measurements: 2.6 x 1.6 x 1.5 cm = volume: 3.1 mL. Normal
appearance/no adnexal mass.

Other findings

No abnormal free fluid.
IMPRESSION: 1. Endometrial thickness of 8.9 mm. Endometrial thickness is
considered abnormal for an asymptomatic post-menopausal female.
Endometrial sampling should be considered to exclude carcinoma.
2. Nonvisualized right ovary

## 2024-02-24 ENCOUNTER — Other Ambulatory Visit: Payer: Self-pay | Admitting: Family Medicine

## 2024-02-24 ENCOUNTER — Encounter: Payer: Self-pay | Admitting: Family Medicine

## 2024-02-24 ENCOUNTER — Other Ambulatory Visit (HOSPITAL_BASED_OUTPATIENT_CLINIC_OR_DEPARTMENT_OTHER): Payer: Self-pay

## 2024-02-24 MED ORDER — ZEPBOUND 15 MG/0.5ML ~~LOC~~ SOAJ
15.0000 mg | SUBCUTANEOUS | 1 refills | Status: AC
  Filled 2024-02-24: qty 2, 28d supply, fill #0
  Filled 2024-03-22: qty 2, 28d supply, fill #1
  Filled 2024-04-23: qty 2, 28d supply, fill #2
  Filled 2024-05-15: qty 2, 28d supply, fill #3
  Filled 2024-06-26: qty 2, 28d supply, fill #4
  Filled 2024-07-24: qty 2, 28d supply, fill #5

## 2024-03-01 ENCOUNTER — Encounter: Payer: Self-pay | Admitting: Family Medicine

## 2024-03-01 ENCOUNTER — Ambulatory Visit (INDEPENDENT_AMBULATORY_CARE_PROVIDER_SITE_OTHER): Admitting: Family Medicine

## 2024-03-01 ENCOUNTER — Other Ambulatory Visit (HOSPITAL_BASED_OUTPATIENT_CLINIC_OR_DEPARTMENT_OTHER): Payer: Self-pay

## 2024-03-01 VITALS — BP 120/80 | HR 79 | Temp 98.9°F

## 2024-03-01 DIAGNOSIS — R059 Cough, unspecified: Secondary | ICD-10-CM | POA: Diagnosis not present

## 2024-03-01 DIAGNOSIS — J019 Acute sinusitis, unspecified: Secondary | ICD-10-CM | POA: Diagnosis not present

## 2024-03-01 MED ORDER — HYDROCODONE BIT-HOMATROP MBR 5-1.5 MG/5ML PO SOLN
5.0000 mL | Freq: Three times a day (TID) | ORAL | 0 refills | Status: DC | PRN
  Filled 2024-03-01: qty 120, 8d supply, fill #0

## 2024-03-01 MED ORDER — AMOXICILLIN-POT CLAVULANATE 875-125 MG PO TABS
1.0000 | ORAL_TABLET | Freq: Two times a day (BID) | ORAL | 0 refills | Status: DC
  Filled 2024-03-01: qty 14, 7d supply, fill #0

## 2024-03-01 NOTE — Progress Notes (Signed)
   Acute Office Visit  Subjective:     Patient ID: Kristina Martinez, female    DOB: 16-May-1971, 53 y.o.   MRN: [id number]  Chief Complaint  Patient presents with   Cough   Sinusitis    HPI Patient is in today for sinus congestion, pressure and discharge x 2 week. ST intially but better now.  No Fever. + bodyaches.  Chest now feels heavy cougyh is keeping her up at night.    ROS      Objective:    BP 120/80   Pulse 79   Temp 98.9 F (37.2 C) (Oral)   LMP 03/09/2020 (Exact Date)   SpO2 98%    Physical Exam Constitutional:      Appearance: Normal appearance.  HENT:     Head: Normocephalic and atraumatic.     Right Ear: Tympanic membrane, ear canal and external ear normal. There is no impacted cerumen.     Left Ear: Tympanic membrane, ear canal and external ear normal. There is no impacted cerumen.     Nose: Nose normal.     Mouth/Throat:     Pharynx: Oropharynx is clear.  Eyes:     Conjunctiva/sclera: Conjunctivae normal.  Cardiovascular:     Rate and Rhythm: Normal rate and regular rhythm.  Pulmonary:     Effort: Pulmonary effort is normal.     Breath sounds: Normal breath sounds.  Musculoskeletal:     Cervical back: Neck supple. No tenderness.  Lymphadenopathy:     Cervical: No cervical adenopathy.  Skin:    General: Skin is warm and dry.  Neurological:     Mental Status: She is alert and oriented to person, place, and time.  Psychiatric:        Mood and Affect: Mood normal.     No results found for any visits on 03/01/24.      Assessment & Plan:   Problem List Items Addressed This Visit   None Visit Diagnoses       Cough, unspecified type    -  Primary   Relevant Medications   amoxicillin -clavulanate (AUGMENTIN ) 875-125 MG tablet   HYDROcodone  bit-homatropine (HYCODAN) 5-1.5 MG/5ML syrup     Acute non-recurrent sinusitis, unspecified location       Relevant Medications   amoxicillin -clavulanate (AUGMENTIN ) 875-125 MG tablet   HYDROcodone   bit-homatropine (HYCODAN) 5-1.5 MG/5ML syrup      Acute sinusitis-will treat with Augmentin .  Hydrocodone  syrup given for bedtime.  Call if not better in 1 week.  Stay well-hydrated continue nasal saline irrigation she could also add in a trial of Flonase  or Nasonex as well.  Meds ordered this encounter  Medications   amoxicillin -clavulanate (AUGMENTIN ) 875-125 MG tablet    Sig: Take 1 tablet by mouth 2 (two) times daily.    Dispense:  14 tablet    Refill:  0   HYDROcodone  bit-homatropine (HYCODAN) 5-1.5 MG/5ML syrup    Sig: Take 5 mLs by mouth every 8 (eight) hours as needed for cough.    Dispense:  120 mL    Refill:  0    No follow-ups on file.  Dorothyann Byars, MD

## 2024-03-01 NOTE — Progress Notes (Signed)
 Pt reports that she has been experiencing sxs x 2 weeks. She has had cough, nasal congestion, and sinus drainage.   She's been taking Tylenol  cold/flu and Nyquil.

## 2024-03-23 ENCOUNTER — Other Ambulatory Visit (HOSPITAL_BASED_OUTPATIENT_CLINIC_OR_DEPARTMENT_OTHER): Payer: Self-pay

## 2024-04-23 ENCOUNTER — Other Ambulatory Visit (HOSPITAL_BASED_OUTPATIENT_CLINIC_OR_DEPARTMENT_OTHER): Payer: Self-pay

## 2024-05-14 ENCOUNTER — Encounter: Payer: Self-pay | Admitting: Family Medicine

## 2024-05-14 ENCOUNTER — Ambulatory Visit (INDEPENDENT_AMBULATORY_CARE_PROVIDER_SITE_OTHER): Admitting: Family Medicine

## 2024-05-14 VITALS — BP 135/81 | HR 89 | Ht 65.5 in | Wt 183.0 lb

## 2024-05-14 DIAGNOSIS — Z7689 Persons encountering health services in other specified circumstances: Secondary | ICD-10-CM | POA: Diagnosis not present

## 2024-05-14 DIAGNOSIS — R748 Abnormal levels of other serum enzymes: Secondary | ICD-10-CM | POA: Diagnosis not present

## 2024-05-14 DIAGNOSIS — F988 Other specified behavioral and emotional disorders with onset usually occurring in childhood and adolescence: Secondary | ICD-10-CM

## 2024-05-14 DIAGNOSIS — Z23 Encounter for immunization: Secondary | ICD-10-CM | POA: Diagnosis not present

## 2024-05-14 MED ORDER — METHYLPHENIDATE HCL 10 MG PO TABS: 10.0000 mg | ORAL_TABLET | Freq: Two times a day (BID) | ORAL | 0 refills | Status: AC

## 2024-05-14 NOTE — Progress Notes (Signed)
 Established Patient Office Visit  Patient ID: Kristina Martinez, female    DOB: Jan 21, 1971  Age: 53 y.o. MRN: [id number] PCP: Alvan Dorothyann BIRCH, MD  Chief Complaint  Patient presents with   Weight Check    Subjective:     HPI  Discussed the use of AI scribe software for clinical note transcription with the patient, who gave verbal consent to proceed.  History of Present Illness Kristina Martinez is a 53 year old female who presents for weight management and medication adjustment.  Weight management and medication adjustment - Target weight is 175 pounds, with a possible comfort level at 165 pounds - Considering medication dosage adjustment upon reaching target weight to maintain weight loss - Currently taking medication every 8-9 days for the past three weeks to accommodate travel plans - Current medication schedule has been effective - Cautious about potential nausea if medication intervals are extended too far  Physical activity - Attempting to incorporate more exercise into daily routine - Recently able to plan for more personal time, including exercise, due to increased stability with employees - Engaging in physical activities such as Christmas decorating, including climbing ladders - Physical activity may not significantly elevate heart rate  Sleep disturbance - Difficulty sleeping due to husband's occasional snoring, which disrupts rest  Preventive health measures - Considering shingles and pneumonia vaccines due to age over 50 years  Laboratory findings - History of elevated alkaline phosphatase levels on last laboratory evaluation  Attention deficit disorder management - Currently taking methylphenidate  10 mg for ADD - Tolerates methylphenidate  well     ROS    Objective:     BP 135/81   Pulse 89   Ht 5' 5.5 (1.664 m)   Wt 183 lb (83 kg)   LMP 03/09/2020 (Exact Date)   SpO2 99%   BMI 29.99 kg/m    Physical Exam Vitals and nursing note  reviewed.  Constitutional:      Appearance: Normal appearance.  HENT:     Head: Normocephalic and atraumatic.  Eyes:     Conjunctiva/sclera: Conjunctivae normal.  Cardiovascular:     Rate and Rhythm: Normal rate and regular rhythm.  Pulmonary:     Effort: Pulmonary effort is normal.     Breath sounds: Normal breath sounds.  Skin:    General: Skin is warm and dry.  Neurological:     Mental Status: She is alert.  Psychiatric:        Mood and Affect: Mood normal.      No results found for any visits on 05/14/24.    The 10-year ASCVD risk score (Arnett DK, et al., 2019) is: 3.5%    Assessment & Plan:   Problem List Items Addressed This Visit       Other   Encounter for weight management   Visit #: 8 Starting Weight: 247 lbs    Current weight:  183 lbs , down 64 lbs .   Previous weight: 192 lbs  Change in weight:  down 9 lbs Goal weight: 175 lbs  Dietary goals: continue to work on increasing vegetable intake and portion control. Trying to eat out less.  Exercise goals: Try to incorporate walking again.   Medication: Now on Zepbound  15mg .   PA denied - paying cash.    Follow-up and referrals: 16 weeks           Attention deficit disorder   Attention deficit disorder Currently managed with methylphenidate  10 mg. Tolerating medication well. - Refilled  methylphenidate  10 mg for ADD to mail order pharmacy.      Relevant Medications   methylphenidate  (RITALIN ) 10 MG tablet   Other Visit Diagnoses       Alkaline phosphatase elevation    -  Primary   Relevant Orders   CMP14+EGFR     Encounter for immunization       Relevant Orders   Flu vaccine trivalent PF, 6mos and older(Flulaval,Afluria,Fluarix,Fluzone) (Completed)   Zoster Recombinant (Shingrix ) (Completed)       Assessment and Plan Assessment & Plan Elevated alkaline phosphatase Level noted on last set of labs. - Repeated alkaline phosphatase level today.  General Health Maintenance Discussed  shingles and pneumonia vaccines. Shingles vaccine is a two-part series, second dose may cause more side effects. Pneumonia vaccine is one-time with booster around age 19. Both approved for age 85 and over. - Administered first dose of shingles vaccine today. - Encouraged scheduling second dose of shingles vaccine 2-6 months after first dose. - Discussed pneumonia vaccine and encouraged administration.    Return in about 4 months (around 09/11/2024) for WEight Mgt .    Dorothyann Byars, MD [facility] at [facility]

## 2024-05-14 NOTE — Assessment & Plan Note (Signed)
 Attention deficit disorder Currently managed with methylphenidate  10 mg. Tolerating medication well. - Refilled methylphenidate  10 mg for ADD to mail order pharmacy.

## 2024-05-14 NOTE — Assessment & Plan Note (Addendum)
 Visit #: 8 Starting Weight: 247 lbs    Current weight:  183 lbs , down 64 lbs .   Previous weight: 192 lbs  Change in weight:  down 9 lbs Goal weight: 175 lbs  Dietary goals: continue to work on increasing vegetable intake and portion control. Trying to eat out less.  Exercise goals: Try to incorporate walking again.   Medication: Now on Zepbound  15mg .   PA denied - paying cash.    Follow-up and referrals: 16 weeks

## 2024-05-15 ENCOUNTER — Other Ambulatory Visit (HOSPITAL_BASED_OUTPATIENT_CLINIC_OR_DEPARTMENT_OTHER): Payer: Self-pay

## 2024-05-15 ENCOUNTER — Ambulatory Visit: Payer: Self-pay | Admitting: Family Medicine

## 2024-05-15 LAB — CMP14+EGFR
ALT: 10 IU/L (ref 0–32)
AST: 22 IU/L (ref 0–40)
Albumin: 4.2 g/dL (ref 3.8–4.9)
Alkaline Phosphatase: 123 IU/L (ref 49–135)
BUN/Creatinine Ratio: 14 (ref 9–23)
BUN: 11 mg/dL (ref 6–24)
Bilirubin Total: 0.5 mg/dL (ref 0.0–1.2)
CO2: 17 mmol/L — ABNORMAL LOW (ref 20–29)
Calcium: 9.6 mg/dL (ref 8.7–10.2)
Chloride: 103 mmol/L (ref 96–106)
Creatinine, Ser: 0.77 mg/dL (ref 0.57–1.00)
Globulin, Total: 2.5 g/dL (ref 1.5–4.5)
Glucose: 91 mg/dL (ref 70–99)
Potassium: 4.6 mmol/L (ref 3.5–5.2)
Sodium: 138 mmol/L (ref 134–144)
Total Protein: 6.7 g/dL (ref 6.0–8.5)
eGFR: 91 mL/min/1.73 (ref >59)

## 2024-05-15 NOTE — Progress Notes (Signed)
 Your lab work is within acceptable range and there are no concerning findings.   ?

## 2024-06-26 ENCOUNTER — Other Ambulatory Visit (HOSPITAL_BASED_OUTPATIENT_CLINIC_OR_DEPARTMENT_OTHER): Payer: Self-pay

## 2024-07-24 ENCOUNTER — Other Ambulatory Visit (HOSPITAL_BASED_OUTPATIENT_CLINIC_OR_DEPARTMENT_OTHER): Payer: Self-pay

## 2024-09-10 ENCOUNTER — Ambulatory Visit: Admitting: Family Medicine
